# Patient Record
Sex: Male | Born: 1935 | Race: White | Hispanic: No | Smoking: Former smoker
Health system: Southern US, Community
[De-identification: ages and names within clinical notes are randomized; demographics above are authoritative.]

## PROBLEM LIST (undated history)

## (undated) DIAGNOSIS — I219 Acute myocardial infarction, unspecified: Secondary | ICD-10-CM

## (undated) DIAGNOSIS — E785 Hyperlipidemia, unspecified: Secondary | ICD-10-CM

## (undated) DIAGNOSIS — J449 Chronic obstructive pulmonary disease, unspecified: Secondary | ICD-10-CM

## (undated) DIAGNOSIS — C801 Malignant (primary) neoplasm, unspecified: Secondary | ICD-10-CM

## (undated) DIAGNOSIS — I10 Essential (primary) hypertension: Secondary | ICD-10-CM

## (undated) DIAGNOSIS — I701 Atherosclerosis of renal artery: Secondary | ICD-10-CM

## (undated) HISTORY — PX: CHOLECYSTECTOMY: SHX55

## (undated) HISTORY — DX: Essential (primary) hypertension: I10

## (undated) HISTORY — PX: CORONARY ANGIOPLASTY WITH STENT PLACEMENT: SHX49

## (undated) HISTORY — DX: Hyperlipidemia, unspecified: E78.5

## (undated) HISTORY — DX: Acute myocardial infarction, unspecified: I21.9

---

## 1993-10-24 DIAGNOSIS — I219 Acute myocardial infarction, unspecified: Secondary | ICD-10-CM

## 1993-10-24 HISTORY — DX: Acute myocardial infarction, unspecified: I21.9

## 2007-10-08 ENCOUNTER — Emergency Department: Payer: Self-pay | Admitting: Internal Medicine

## 2009-04-30 ENCOUNTER — Ambulatory Visit: Payer: Self-pay | Admitting: Unknown Physician Specialty

## 2011-05-29 ENCOUNTER — Emergency Department: Payer: Self-pay | Admitting: Emergency Medicine

## 2011-06-14 ENCOUNTER — Ambulatory Visit: Payer: Self-pay | Admitting: Surgery

## 2011-06-20 ENCOUNTER — Ambulatory Visit: Payer: Self-pay | Admitting: Surgery

## 2013-07-07 LAB — CBC
MCV: 95 fL (ref 80–100)
RBC: 4.65 10*6/uL (ref 4.40–5.90)
RDW: 13.9 % (ref 11.5–14.5)
WBC: 7.3 10*3/uL (ref 3.8–10.6)

## 2013-07-07 LAB — BASIC METABOLIC PANEL
Calcium, Total: 8.6 mg/dL (ref 8.5–10.1)
Chloride: 109 mmol/L — ABNORMAL HIGH (ref 98–107)
Co2: 27 mmol/L (ref 21–32)
Creatinine: 1.5 mg/dL — ABNORMAL HIGH (ref 0.60–1.30)
EGFR (Non-African Amer.): 42 — ABNORMAL LOW
Glucose: 98 mg/dL (ref 65–99)
Osmolality: 283 (ref 275–301)
Potassium: 4.3 mmol/L (ref 3.5–5.1)

## 2013-07-07 LAB — CK TOTAL AND CKMB (NOT AT ARMC): CK, Total: 97 U/L (ref 35–232)

## 2013-07-08 ENCOUNTER — Inpatient Hospital Stay: Payer: Self-pay | Admitting: Student

## 2013-07-08 LAB — CK TOTAL AND CKMB (NOT AT ARMC)
CK, Total: 79 U/L (ref 35–232)
CK, Total: 83 U/L (ref 35–232)
CK-MB: 2.4 ng/mL (ref 0.5–3.6)

## 2013-07-08 LAB — TROPONIN I: Troponin-I: <0.02 ng/mL

## 2013-07-08 LAB — BASIC METABOLIC PANEL
Anion Gap: 5 — ABNORMAL LOW (ref 7–16)
BUN: 20 mg/dL — ABNORMAL HIGH (ref 7–18)
Chloride: 108 mmol/L — ABNORMAL HIGH (ref 98–107)
Co2: 30 mmol/L (ref 21–32)
Creatinine: 1.46 mg/dL — ABNORMAL HIGH (ref 0.60–1.30)
EGFR (Non-African Amer.): 44 — ABNORMAL LOW
Glucose: 85 mg/dL (ref 65–99)
Potassium: 4.4 mmol/L (ref 3.5–5.1)

## 2013-07-09 LAB — BASIC METABOLIC PANEL
BUN: 19 mg/dL — ABNORMAL HIGH (ref 7–18)
Calcium, Total: 8.8 mg/dL (ref 8.5–10.1)
Creatinine: 1.44 mg/dL — ABNORMAL HIGH (ref 0.60–1.30)
EGFR (African American): 52 — ABNORMAL LOW
Glucose: 97 mg/dL (ref 65–99)
Osmolality: 280 (ref 275–301)
Potassium: 4 mmol/L (ref 3.5–5.1)
Sodium: 139 mmol/L (ref 136–145)

## 2013-07-30 ENCOUNTER — Institutional Professional Consult (permissible substitution): Payer: Self-pay | Admitting: Pulmonary Disease

## 2013-08-06 ENCOUNTER — Encounter: Payer: Self-pay | Admitting: Pulmonary Disease

## 2013-08-06 ENCOUNTER — Ambulatory Visit (INDEPENDENT_AMBULATORY_CARE_PROVIDER_SITE_OTHER): Payer: Medicare Other | Admitting: Pulmonary Disease

## 2013-08-06 VITALS — BP 104/60 | HR 62 | Temp 97.7°F | Ht 71.0 in | Wt 196.0 lb

## 2013-08-06 DIAGNOSIS — J9611 Chronic respiratory failure with hypoxia: Secondary | ICD-10-CM

## 2013-08-06 DIAGNOSIS — J9610 Chronic respiratory failure, unspecified whether with hypoxia or hypercapnia: Secondary | ICD-10-CM

## 2013-08-06 NOTE — Patient Instructions (Signed)
You don't need to use spiriva or oxygen during the day anymore We will set up a sleep oxygen test for you.  Don't wear the oxygen when you do this test. We will see you again if you have trouble breathing or cough

## 2013-08-06 NOTE — Progress Notes (Signed)
Subjective:    Patient ID: Louis Harrison, male    DOB: 05-11-1928, 79 y.o.   MRN: [id number]  HPI  This is a very pleasant 79 year old male who comes to our clinic today for evaluation of hypoxemic respiratory failure. He smoked one and a half packs of cigarettes daily for 25-30 years and quit in 1995. Since then he has never had respiratory symptoms of any kind. He stays active in his garden and in the yard and walks 1 mile a day for exercise purposes. He never had shortness of breath with any of these activities.  In September 2014 he developed the sudden onset of dizziness which lasted for 15 minutes. He was admitted to [facility] for evaluation for 3 days. During this time he had a CT scan which was normal. His symptoms never returned after the initial 15 minutes. He was found to have hypoxemic respiratory failure during this time so a VQ scan was performed which showed no evidence of a pulmonary embolism. He also had a left heart catheterization (by report I do not have evidence of this) which he says was normal. He was started on Spiriva empirically for COPD and 2 L of oxygen continuously. He has been using his oxygen regularly but does not feel it makes any difference in his breathing.  His wife states that he snores at night some but never stops breathing.  As noted above, he continues to stay active and has no symptoms of shortness of breath.  He has noted a cough in the last month which she attributes to a cold he caught. He does not have recurrent bronchitis symptoms which have been a problem over the last several months to years.  Past Medical History  Diagnosis Date  . Heart attack 1995  . Hypertension   . Hyperlipidemia      Family History  Problem Relation Age of Onset  . Family history unknown: Yes     History   Social History  . Marital Status: Married    Spouse Name: N/A    Number of Children: N/A  . Years of Education: N/A    Occupational History  . Not on file.   Social History Main Topics  . Smoking status: Former Smoker -- 1.50 packs/day for 30 years    Types: Cigarettes    Quit date: 10/24/1993  . Smokeless tobacco: Not on file  . Alcohol Use: No  . Drug Use: No  . Sexual Activity: Not on file   Other Topics Concern  . Not on file   Social History Narrative  . No narrative on file     No Known Allergies   No outpatient prescriptions prior to visit.   No facility-administered medications prior to visit.      Review of Systems  Constitutional: Negative for fever, chills, activity change, appetite change and unexpected weight change.  HENT: Positive for congestion. Negative for dental problem, postnasal drip, rhinorrhea, sneezing, sore throat, trouble swallowing and voice change.   Eyes: Negative for visual disturbance.  Respiratory: Negative for cough, choking and shortness of breath.   Cardiovascular: Negative for chest pain and leg swelling.  Gastrointestinal: Negative for nausea, vomiting and abdominal pain.  Genitourinary: Negative for difficulty urinating.  Musculoskeletal: Negative for arthralgias.  Skin: Negative for rash.  Psychiatric/Behavioral: Negative for behavioral problems and confusion.       Objective:   Physical Exam  Filed Vitals:   08/06/13 1026  BP: 104/60  Pulse: 62  Temp: 97.7 F (36.5 C)  TempSrc: Oral  Height: 5\' 11"  (1.803 m)  Weight: 196 lb (88.905 kg)  SpO2: 93%   Gen: well appearing, no acute distress HEENT: NCAT, PERRL, EOMi, OP clear, neck supple without masses PULM: CTA B CV: RRR, no mgr, no JVD AB: BS+, soft, nontender, no hsm Ext: warm, no edema, no clubbing, no cyanosis Derm: no rash or skin breakdown Neuro: A&Ox4, CN II-XII intact, strength 5/5 in all 4 extremities  08/06/2013 walked 500 feet in office on RA, never dropped below 90% 08/06/2013 simple spirometry completely normal 06/2013 CXR reviewed by me> normal lung fields, normal  cardiac sillhouette     Assessment & Plan:   Chronic hypoxemic respiratory failure Today in the office Louis Harrison did not have evidence of exertional hypoxemia. His oxygen level was also normal at rest. Further, he performed a simple spirometry test today which was completely normal. I reviewed his chest x-ray from [facility] from last month and it is normal. His physical exam today is normal.  What I don't know is if he has hypoxemia at night and so we need to order an overnight oximetry test.  So in summary, I cannot find any objective abnormality from a respiratory standpoint today. It is unclear to me why he had hypoxemia during his recent hospitalization. It does not appear that he had evidence of a pulmonary embolism based on the normal VQ scan.  Plan: -Stop Spiriva -Stop daytime oxygen -Continue nighttime oxygen until we have a room air overnight oximetry test -Followup with me if symptoms develop   Updated Medication List Outpatient Encounter Prescriptions as of 08/06/2013  Medication Sig Dispense Refill  . amLODipine (NORVASC) 2.5 MG tablet Take 2.5 mg by mouth daily.      Marland Kitchen aspirin 325 MG tablet Take 325 mg by mouth daily.      Marland Kitchen atenolol (TENORMIN) 50 MG tablet Take 25 mg by mouth daily.      . isosorbide mononitrate (IMDUR) 30 MG 24 hr tablet Take 30 mg by mouth daily.      Marland Kitchen lisinopril (PRINIVIL,ZESTRIL) 5 MG tablet Take 5 mg by mouth daily.      Marland Kitchen omeprazole (PRILOSEC) 20 MG capsule Take 20 mg by mouth daily.      . simvastatin (ZOCOR) 20 MG tablet Take 20 mg by mouth every evening.      . tamsulosin (FLOMAX) 0.4 MG CAPS capsule Take 0.4 mg by mouth daily.      Marland Kitchen tiotropium (SPIRIVA) 18 MCG inhalation capsule Place 18 mcg into inhaler and inhale daily.       No facility-administered encounter medications on file as of 08/06/2013.

## 2013-08-06 NOTE — Assessment & Plan Note (Signed)
Today in the office Louis Harrison did not have evidence of exertional hypoxemia. His oxygen level was also normal at rest. Further, he performed a simple spirometry test today which was completely normal. I reviewed his chest x-ray from [facility] from last month and it is normal. His physical exam today is normal.  What I don't know is if he has hypoxemia at night and so we need to order an overnight oximetry test.  So in summary, I cannot find any objective abnormality from a respiratory standpoint today. It is unclear to me why he had hypoxemia during his recent hospitalization. It does not appear that he had evidence of a pulmonary embolism based on the normal VQ scan.  Plan: -Stop Spiriva -Stop daytime oxygen -Continue nighttime oxygen until we have a room air overnight oximetry test -Followup with me if symptoms develop

## 2013-08-21 ENCOUNTER — Telehealth: Payer: Self-pay | Admitting: Pulmonary Disease

## 2013-08-21 DIAGNOSIS — J9610 Chronic respiratory failure, unspecified whether with hypoxia or hypercapnia: Secondary | ICD-10-CM

## 2013-08-21 NOTE — Telephone Encounter (Signed)
I spoke with pt spouse. Pt had ONO done last week. Pt is not using the O2 at all and would like order to d/c this. Dr. Lake Bells please advise if you have results? thanks

## 2013-08-21 NOTE — Telephone Encounter (Signed)
Have we seen the ONO results yet? If they are normal then we can d/c the O2

## 2013-08-22 NOTE — Telephone Encounter (Addendum)
FYI BQ, these have not been received yet. We will get them to you once received and will inform you. ONO requested from [facility]--to be faxed to our office (triage fax)  Will forward to [facility] to keep an eye out for these to come across her desk.

## 2013-08-22 NOTE — Telephone Encounter (Signed)
ONO received and placed in BQ's lookat  Will forward to him as reminder to review when in clinic next time Thanks

## 2013-08-22 NOTE — Telephone Encounter (Signed)
Pt calling in ref to previous msg can be reached at [phone].Louis Harrison

## 2013-08-22 NOTE — Telephone Encounter (Signed)
Pt wife aware that once Dr. Lake Bells reviews the Joselyn Arrow results we will send order as needed. [place] Bing, CMA

## 2013-08-26 ENCOUNTER — Telehealth: Payer: Self-pay | Admitting: *Deleted

## 2013-08-26 ENCOUNTER — Encounter: Payer: Self-pay | Admitting: Pulmonary Disease

## 2013-08-26 NOTE — Telephone Encounter (Signed)
Pt's wife is aware of ONO results. Order will be placed for O2 to be dc'd.

## 2013-08-26 NOTE — Telephone Encounter (Signed)
Pt's wife is aware of ONO results.

## 2013-08-26 NOTE — Telephone Encounter (Signed)
Message copied by Horatio Pel on Mon Aug 26, 2013 10:14 AM ------      Message from: Simonne Maffucci B      Created: Mon Aug 26, 2013  8:59 AM       L,            Please let him know that the ONO showed mild desaturation for about an hour at night.  He really does not need oxygen for this.            Thanks,      B ------

## 2013-08-26 NOTE — Telephone Encounter (Signed)
lmtcb x1 

## 2014-03-24 DIAGNOSIS — J449 Chronic obstructive pulmonary disease, unspecified: Secondary | ICD-10-CM | POA: Insufficient documentation

## 2014-03-24 DIAGNOSIS — Z9889 Other specified postprocedural states: Secondary | ICD-10-CM | POA: Insufficient documentation

## 2014-03-24 DIAGNOSIS — I219 Acute myocardial infarction, unspecified: Secondary | ICD-10-CM | POA: Insufficient documentation

## 2014-03-24 DIAGNOSIS — E785 Hyperlipidemia, unspecified: Secondary | ICD-10-CM | POA: Insufficient documentation

## 2014-03-24 DIAGNOSIS — I701 Atherosclerosis of renal artery: Secondary | ICD-10-CM | POA: Insufficient documentation

## 2014-07-14 ENCOUNTER — Ambulatory Visit: Payer: Self-pay | Admitting: Otolaryngology

## 2014-07-14 LAB — CBC WITH DIFFERENTIAL/PLATELET
BASOS ABS: 0.1 10*3/uL (ref 0.0–0.1)
Basophil %: 0.8 %
EOS PCT: 2.9 %
Eosinophil #: 0.3 10*3/uL (ref 0.0–0.7)
HCT: 45.5 % (ref 40.0–52.0)
HGB: 14.8 g/dL (ref 13.0–18.0)
LYMPHS ABS: 1.1 10*3/uL (ref 1.0–3.6)
LYMPHS PCT: 13.2 %
MCH: 32.1 pg (ref 26.0–34.0)
MCHC: 32.5 g/dL (ref 32.0–36.0)
MCV: 99 fL (ref 80–100)
MONO ABS: 0.9 x10 3/mm (ref 0.2–1.0)
MONOS PCT: 10.3 %
NEUTROS ABS: 6.4 10*3/uL (ref 1.4–6.5)
Neutrophil %: 72.8 %
PLATELETS: 128 10*3/uL — AB (ref 150–440)
RBC: 4.62 10*6/uL (ref 4.40–5.90)
RDW: 14.3 % (ref 11.5–14.5)
WBC: 8.7 10*3/uL (ref 3.8–10.6)

## 2014-07-23 ENCOUNTER — Ambulatory Visit: Payer: Self-pay | Admitting: Otolaryngology

## 2014-07-25 LAB — PATHOLOGY REPORT

## 2014-07-30 ENCOUNTER — Ambulatory Visit: Payer: Self-pay | Admitting: Radiation Oncology

## 2014-08-24 ENCOUNTER — Ambulatory Visit: Payer: Self-pay | Admitting: Radiation Oncology

## 2014-08-27 LAB — CBC CANCER CENTER
Basophil #: 0.1 x10 3/mm (ref 0.0–0.1)
Basophil %: 1.2 %
EOS ABS: 0.4 x10 3/mm (ref 0.0–0.7)
EOS PCT: 5.2 %
HCT: 46.9 % (ref 40.0–52.0)
HGB: 15.6 g/dL (ref 13.0–18.0)
Lymphocyte #: 1.1 x10 3/mm (ref 1.0–3.6)
Lymphocyte %: 14.2 %
MCH: 33 pg (ref 26.0–34.0)
MCHC: 33.3 g/dL (ref 32.0–36.0)
MCV: 99 fL (ref 80–100)
MONO ABS: 0.8 x10 3/mm (ref 0.2–1.0)
MONOS PCT: 9.4 %
NEUTROS PCT: 70 %
Neutrophil #: 5.6 x10 3/mm (ref 1.4–6.5)
Platelet: 150 x10 3/mm (ref 150–440)
RBC: 4.73 10*6/uL (ref 4.40–5.90)
RDW: 14.2 % (ref 11.5–14.5)
WBC: 8 x10 3/mm (ref 3.8–10.6)

## 2014-09-03 LAB — CBC CANCER CENTER
BASOS ABS: 0.1 x10 3/mm (ref 0.0–0.1)
Basophil %: 1.3 %
EOS ABS: 0.3 x10 3/mm (ref 0.0–0.7)
Eosinophil %: 3.9 %
HCT: 45.2 % (ref 40.0–52.0)
HGB: 15 g/dL (ref 13.0–18.0)
Lymphocyte #: 1.3 x10 3/mm (ref 1.0–3.6)
Lymphocyte %: 16.2 %
MCH: 33 pg (ref 26.0–34.0)
MCHC: 33.2 g/dL (ref 32.0–36.0)
MCV: 99 fL (ref 80–100)
MONO ABS: 0.8 x10 3/mm (ref 0.2–1.0)
MONOS PCT: 9.6 %
NEUTROS PCT: 69 %
Neutrophil #: 5.4 x10 3/mm (ref 1.4–6.5)
Platelet: 134 x10 3/mm — ABNORMAL LOW (ref 150–440)
RBC: 4.54 10*6/uL (ref 4.40–5.90)
RDW: 14 % (ref 11.5–14.5)
WBC: 7.8 x10 3/mm (ref 3.8–10.6)

## 2014-09-10 LAB — CBC CANCER CENTER
BASOS ABS: 0.1 x10 3/mm (ref 0.0–0.1)
Basophil %: 1 %
Eosinophil #: 0.2 x10 3/mm (ref 0.0–0.7)
Eosinophil %: 2.8 %
HCT: 45.8 % (ref 40.0–52.0)
HGB: 15.2 g/dL (ref 13.0–18.0)
Lymphocyte #: 1 x10 3/mm (ref 1.0–3.6)
Lymphocyte %: 11.5 %
MCH: 32.7 pg (ref 26.0–34.0)
MCHC: 33.1 g/dL (ref 32.0–36.0)
MCV: 99 fL (ref 80–100)
MONO ABS: 0.8 x10 3/mm (ref 0.2–1.0)
Monocyte %: 8.9 %
Neutrophil #: 6.4 x10 3/mm (ref 1.4–6.5)
Neutrophil %: 75.8 %
PLATELETS: 142 x10 3/mm — AB (ref 150–440)
RBC: 4.63 10*6/uL (ref 4.40–5.90)
RDW: 13.9 % (ref 11.5–14.5)
WBC: 8.5 x10 3/mm (ref 3.8–10.6)

## 2014-09-16 LAB — CBC CANCER CENTER
BASOS ABS: 0.1 x10 3/mm (ref 0.0–0.1)
BASOS PCT: 0.8 %
EOS ABS: 0.3 x10 3/mm (ref 0.0–0.7)
Eosinophil %: 3.7 %
HCT: 46.4 % (ref 40.0–52.0)
HGB: 15.4 g/dL (ref 13.0–18.0)
LYMPHS PCT: 10.8 %
Lymphocyte #: 1 x10 3/mm (ref 1.0–3.6)
MCH: 33 pg (ref 26.0–34.0)
MCHC: 33.2 g/dL (ref 32.0–36.0)
MCV: 100 fL (ref 80–100)
MONOS PCT: 8.7 %
Monocyte #: 0.8 x10 3/mm (ref 0.2–1.0)
NEUTROS ABS: 7.1 x10 3/mm — AB (ref 1.4–6.5)
Neutrophil %: 76 %
Platelet: 164 x10 3/mm (ref 150–440)
RBC: 4.67 10*6/uL (ref 4.40–5.90)
RDW: 13.6 % (ref 11.5–14.5)
WBC: 9.4 x10 3/mm (ref 3.8–10.6)

## 2014-09-23 ENCOUNTER — Ambulatory Visit: Payer: Self-pay | Admitting: Radiation Oncology

## 2014-09-24 LAB — CBC CANCER CENTER
Basophil #: 0.1 x10 3/mm (ref 0.0–0.1)
Basophil %: 0.7 %
EOS ABS: 0.1 x10 3/mm (ref 0.0–0.7)
EOS PCT: 1.7 %
HCT: 43.6 % (ref 40.0–52.0)
HGB: 14.6 g/dL (ref 13.0–18.0)
Lymphocyte #: 0.7 x10 3/mm — ABNORMAL LOW (ref 1.0–3.6)
Lymphocyte %: 8.7 %
MCH: 33 pg (ref 26.0–34.0)
MCHC: 33.5 g/dL (ref 32.0–36.0)
MCV: 98 fL (ref 80–100)
Monocyte #: 0.8 x10 3/mm (ref 0.2–1.0)
Monocyte %: 9.9 %
Neutrophil #: 6.6 x10 3/mm — ABNORMAL HIGH (ref 1.4–6.5)
Neutrophil %: 79 %
Platelet: 156 x10 3/mm (ref 150–440)
RBC: 4.43 10*6/uL (ref 4.40–5.90)
RDW: 13.1 % (ref 11.5–14.5)
WBC: 8.3 x10 3/mm (ref 3.8–10.6)

## 2014-10-01 LAB — CBC CANCER CENTER
Basophil #: 0.1 x10 3/mm (ref 0.0–0.1)
Basophil %: 1 %
EOS PCT: 3.8 %
Eosinophil #: 0.3 x10 3/mm (ref 0.0–0.7)
HCT: 42.2 % (ref 40.0–52.0)
HGB: 14.1 g/dL (ref 13.0–18.0)
Lymphocyte #: 1 x10 3/mm (ref 1.0–3.6)
Lymphocyte %: 13.9 %
MCH: 33.1 pg (ref 26.0–34.0)
MCHC: 33.4 g/dL (ref 32.0–36.0)
MCV: 99 fL (ref 80–100)
MONO ABS: 0.7 x10 3/mm (ref 0.2–1.0)
MONOS PCT: 9.5 %
Neutrophil #: 5.1 x10 3/mm (ref 1.4–6.5)
Neutrophil %: 71.8 %
Platelet: 155 x10 3/mm (ref 150–440)
RBC: 4.27 10*6/uL — ABNORMAL LOW (ref 4.40–5.90)
RDW: 13.2 % (ref 11.5–14.5)
WBC: 7.1 x10 3/mm (ref 3.8–10.6)

## 2014-10-08 LAB — CBC CANCER CENTER
Basophil #: 0.1 x10 3/mm (ref 0.0–0.1)
Basophil %: 1.3 %
Eosinophil #: 0.4 x10 3/mm (ref 0.0–0.7)
Eosinophil %: 4.8 %
HCT: 47.3 % (ref 40.0–52.0)
HGB: 15.7 g/dL (ref 13.0–18.0)
Lymphocyte #: 0.9 x10 3/mm — ABNORMAL LOW (ref 1.0–3.6)
Lymphocyte %: 11.5 %
MCH: 32.3 pg (ref 26.0–34.0)
MCHC: 33.2 g/dL (ref 32.0–36.0)
MCV: 97 fL (ref 80–100)
Monocyte #: 0.7 x10 3/mm (ref 0.2–1.0)
Monocyte %: 8.7 %
NEUTROS ABS: 5.9 x10 3/mm (ref 1.4–6.5)
NEUTROS PCT: 73.7 %
Platelet: 157 x10 3/mm (ref 150–440)
RBC: 4.87 10*6/uL (ref 4.40–5.90)
RDW: 13.2 % (ref 11.5–14.5)
WBC: 8 x10 3/mm (ref 3.8–10.6)

## 2014-10-24 ENCOUNTER — Ambulatory Visit: Payer: Self-pay | Admitting: Radiation Oncology

## 2015-02-13 NOTE — Discharge Summary (Signed)
PATIENT NAME:  Louis Harrison, Louis Harrison MR#:  [id number] DATE OF BIRTH:  10/25/1927  DATE OF ADMISSION:  07/08/2013 DATE OF DISCHARGE:  07/10/2013  CONSULTANTS:  Dr. Nehemiah Massed from cardiology.   PRIMARY CARE PHYSICIAN:  At [facility].   PRIMARY CARDIOLOGIST:  Dr. Saralyn Pilar  CHIEF COMPLAINT:  Shortness of breath and confusion.   DISCHARGE DIAGNOSES: 1. Hypoxic respiratory failure likely chronic and secondary to underlying suspected chronic obstructive pulmonary disease.  2.  Renal failure of likely chronic stage III.  3.  Coronary artery disease status post cardiac catheter showing 3-vessel coronary artery disease and medical management was recommended.  4.  Hypertension.  5.  Episode of confusion last week resolved, unclear etiology.  6.  History of sinus bradycardia.  7.  History of tobacco abuse, quit in 1995.  8.  Benign prostatic hypertrophy.  9.  Hyperlipidemia.   DISCHARGE MEDICATIONS:  Atenolol 50 mg once a day, Spiriva 18 mcg inhaled 1 cap daily, lisinopril 5 mg daily, aspirin 325 mg 1 tab once a day, omeprazole 20 mg daily, tamsulosin 0.4 mg once a day, simvastatin 20 mg daily, isosorbide mononitrate 30 mg once a day, amlodipine 2.5 mg once a day.   DISPOSITION:  He will be getting discharged to home with 2 liters continuous oxygen via nasal cannula.   DIET:  Low sodium.   ACTIVITY:  As tolerated.   FOLLOWUP:  Please follow with PCP and your cardiologist within 1 to 2 weeks.  Please follow with pulmonary for PFT testing within 1 to 2 weeks.   DISPOSITION:  Home.  CODE STATUS:  FULL CODE.  SIGNIFICANT LABORATORIES AND IMAGING: Initial BNP was 1006, BUN 22. Creatinine was 1.5, last creatinine of 1.44. BUN of 19 yesterday. Troponins were negative. CK-MB are negative x 3. WBC and hemoglobin within normal limits. Platelets are 130. D-dimer was 0.95. ABG showed pH of 7.4, pCO2 of 40, pO2 of 55.  Echocardiogram showed EF of 50% to 55%, mildly dilated right atrium and left atrium,  mild to moderate tricuspid regurgitation and mitral valve regurgitation.  CT of the head without contrast:  Chronic microvascular ischemic disease, atrophy appropriate for the patient's age. No acute intracranial abnormalities evident.  X-rays of the chest done September 15, showing possible low-grade CHF, perihilar subsegmental atelectasis.  X-ray of the chest, PA and lateral, September 14, showed no acute cardiopulmonary disease.  Lung V/Q scan done on September 16, showed no focal perfusion defect. Overall, probability of pulmonary embolism is low.  HISTORY OF PRESENT ILLNESS AND HOSPITAL COURSE:  For full details of H and P, please see the dictation by Dr. Darvin Neighbours on September 14; but briefly, this is a pleasant 81 year old ex-smoker who smoked for many years but quit in 1995 after a heart attack who came in for the above chief complaint. Has had 2 episodes of shortness of breath and came into the hospital. The patient also did complain of having some confusion a couple weeks ago. The patient had a CAT scan of the head that did not show any acute stroke, was admitted to the hospitalist service. He was admitted to telemetry and thought that the patient likely had underlying angina equivalent. BNP was slightly elevated, but he did not appear to be in congestive heart failure. There was no edema or JVD and no crackles on exam. He did have mild renal failure possibly chronic. He was admitted to the hospitalist service with cardiology consult. He underwent cardiac cath showing 3-vessel chronic disease but medical management was  recommended. He was started on Imdur.  He has had no shortness of breath here; however, was having continuous hypoxemia. He was sent for a V/Q scan, as he could not have a CT scan PE protocol given his renal failure; however, that was a low probability exam. D-dimer was mildly elevated but I suspect the patient likely has underlying COPD. He is not wheezy nor does he have crackles.   He does not appear to be in congestive heart failure and his oxygen levels do drop with ambulation. He was started on Spiriva and he will be discharged on oxygen with instructions to follow with PCP and get referred to pulmonologist for pulmonary function tests.  On the day of discharge, his temperature is 97.7, pulse 61, respiratory 20, blood pressure 103/58, O2 sat 94% on 2 liters. He is a well-developed Caucasian male lying in bed, no obvious distress.   HEENT:  Normocephalic, atraumatic. Pupils are equal.  LUNGS:  Clear to auscultation without wheezing, rhonchi or rales.  ABDOMEN:  Soft, nontender, nondistended.  EXTREMITIES: Showed no lower extremity edema. He is ambulating in the room.  He will be discharged with outpatient followup.  Of note:  His blood pressure was somewhat suboptimal, and he was started on amlodipine low-dose and I discussed with wife that if the pressures fluctuate as an outpatient, this could be held.  TOTAL TIME SPENT:  35 minutes   ____________________________ Vivien Presto, MD sa:ce D: 07/10/2013 12:03:20 ET T: 07/10/2013 12:29:42 ET JOB#: [id number]  cc: Vivien Presto, MD, <Dictator> Vivien Presto MD ELECTRONICALLY SIGNED 07/16/2013 14:08

## 2015-02-13 NOTE — Consult Note (Signed)
PATIENT NAME:  Louis Harrison, Louis Harrison MR#:  [id number] DATE OF BIRTH:  1927-12-10  DATE OF CONSULTATION:  07/08/2013  REFERRING PHYSICIAN: Dr. Darvin Neighbours  CONSULTING PHYSICIAN:  Corey Skains, MD  REASON FOR CONSULTATION: Unstable angina with coronary artery disease.  HISTORY OF PRESENT ILLNESS: This is an 81 year old male with known coronary disease, status post stent placement and myocardial infarction in 1995. The patient also has hypertension and hyperlipidemia on appropriate medication management. He has had worsening episodes of chest pain and pressure radiating into his back with  weakness, fatigue, dizziness and shortness of breath. This lasts for about 10 minutes and then is relieved. The patient has progressive episodes of these waxing and waning while he was seen in the Emergency Room with no current evidence of congestive heart failure or new EKG changes, although EKG shows normal sinus rhythm, left atrial enlargement with septal myocardial infarction. The patient has had no current evidence of myocardial infarction with normal troponin. Currently, he feels well on oxygen.   REVIEW OF SYSTEMS: The remainder review of systems negative for vision change, ringing in the ears, hearing loss, cough, congestion, heartburn, nausea, vomiting, diarrhea, bloody stools, stomach pain, extremity pain, leg weakness, cramping of the buttocks, known blood clots, headaches, blackouts, nosebleeds, congestion, trouble swallowing, frequent urination, urination at night, muscle weakness, numbness, anxiety, depression, skin lesions or skin rashes.   PAST MEDICAL HISTORY:  1.  Myocardial infarction. 2.  Coronary artery disease.  3.  Hypertension.  4.  Hyperlipidemia.   FAMILY HISTORY: No family members with early onset of cardiovascular disease or hypertension.   SOCIAL HISTORY: The patient has remote tobacco use. Currently denies alcohol or tobacco use.   ALLERGIES: As listed.   MEDICATIONS: As listed.    PHYSICAL EXAMINATION:  VITAL SIGNS: Blood pressure 126/68 bilaterally, heart rate 72 upright, reclining, and irregular.  GENERAL: He is a well appearing male in no acute distress.  HEENT: No icterus, thyromegaly, ulcers, hemorrhage, or xanthelasma.  CARDIOVASCULAR: Regular rate and rhythm with normal S1 and S2 with no apparent murmur, gallop, or rub. PMI is diffuse. Carotid upstroke normal without bruit. Jugular venous pressure is normal.  LUNGS: Have few basilar crackles with normal respirations.  ABDOMEN: Soft, nontender, without hepatosplenomegaly or masses. Abdominal aorta is normal size without bruit.  EXTREMITIES: 2+ radial, femoral, dorsal pedal pulses with no lower extremity edema, cyanosis, clubbing or ulcers.  NEUROLOGIC: Oriented to time, place, and person, with normal mood and affect.   ASSESSMENT: An 81 year old male with hypertension, hyperlipidemia, coronary artery disease, old myocardial infarction, abnormal EKG with progressive symptoms suggestive of unstable angina, but no current evidence of acute congestive heart failure.   RECOMMENDATIONS:  1.  Echocardiogram for LV systolic dysfunction, valvular heart disease causing shortness of breath.  2.  Proceed to cardiac catheterization to assess coronary anatomy and further treatment thereof as necessary due to acute unstable angina and [place] class III to IV anginal symptoms without evidence of myocardial infarction.  3.  Continue current medical regimen for hypertension control, stable at this time.  4.  Followup closely chest x-ray for further concerns of congestive heart failure with ambulation.  5.  Further diagnostic testing and treatment as necessary. The patient understands the risks and benefits of cardiac catheterization and these include the possibly of death, stroke, heart attack, infection, bleeding or blood clot. He is at low risk for conscious sedation.   ____________________________ Corey Skains,  MD [id number] D: 07/08/2013 08:45:14 ET T: 07/08/2013 09:03:32 ET JOB#: [id number]  cc: Corey Skains, MD, <Dictator> Corey Skains MD ELECTRONICALLY SIGNED 07/09/2013 7:35

## 2015-02-13 NOTE — H&P (Signed)
PATIENT NAME:  Louis Harrison, Louis Harrison MR#:  [id number] DATE OF BIRTH:  March 30, 1928  DATE OF ADMISSION:  07/07/2013  PRIMARY CARE PHYSICIAN:  [facility]  PRIMARY CARDIOLOGIST:  Dr. Saralyn Pilar   CHIEF COMPLAINT:  Shortness of breath and confusion.   HISTORY OF PRESENT ILLNESS: An 81 year old male patient with history of coronary artery disease, hypertension, presents to the Emergency Room, brought in by the family after he had 2 episodes of shortness of breath. His initial episode was 2 days back, which lasted about half an hour, occurred after he walked for about 1/2 mile. This is unusual, as patient normally  tends to walk up to 1 to 1-1/2 miles and does not get short of breath. Today patient again had a similar episode, along with some lightheadedness. Did not have any cough, chest pain, palpitations or syncope. The patient had 2 episodes of confusion lasting about 30 minutes, as per the wife, in the past 2 weeks. Then he regained his baseline mental status. Did not have any focal weakness, fall or dizziness. Does not have any dementia. No history of CVA.   In the Emergency Room, patient has been found to have elevated D-dimer, but creatinine is high at 1.5, and a CT for PE could not be done. The patient's cardiac enzymes are normal. EKG shows a prior anterior infarct. No acute ST-T wave changes. Chest x-ray is normal.   The patient is being admitted under observation for angina equivalent workup.   PAST MEDICAL HISTORY: 1.  Coronary artery disease, with PCI and stent placed in 1995.  2.  Hypertension.  3.  Past tobacco abuse, quit in 1995.  4.  Sinus bradycardia secondary to atenolol, dose reduced 3 months back.  5.  Hyperlipidemia.  6.  Benign prostatic hypertrophy.   PAST SURGICAL HISTORY:  Cardiac cath.   SOCIAL HISTORY: The patient used to smoke in the past, quit in 1995. No alcohol. No illicit drugs. Ambulates on his own. Lives at home with his wife.   CODE STATUS:  FULL  CODE.  ALLERGIES:  No known drug allergies.   FAMILY HISTORY:  Coronary artery disease.   HOME MEDICATIONS INCLUDE:   1.  Atenolol 50 mg oral once a day.  2.  Lisinopril 5 mg oral daily.  3.  Aspirin 325 mg oral daily.  4.  Omeprazole 20 mg oral daily.  5.  Simvastatin 20 mg oral daily.  6.  Tamsulosin 0.4 mg oral daily.   REVIEW OF SYSTEMS:   CONSTITUTIONAL: Complains of some fatigue. No weight loss, weight gain.  EYES: No blurred vision, pain or redness.  ENT: No tinnitus, ear pain, hearing loss.  RESPIRATORY: No cough, wheeze,  and episodic shortness of breath.  CARDIOVASCULAR:  No chest pain, orthopnea, edema.  GASTROINTESTINAL: No nausea, vomiting, diarrhea, abdominal pain.  GENITOURINARY:  No dysuria, hematuria, frequency.  ENDOCRINE:  No polyuria, nocturia, thyroid problems.  HEMATOLOGIC AND LYMPHATIC:  No anemia, easy bruising, bleeding.  INTEGUMENTARY:  No acne, rash, lesions.  MUSCULOSKELETAL: Has arthritis.  NEUROLOGIC:  Had episodic confusion. No focal numbness, weakness or seizures.  PSYCHIATRIC: No anxiety or depression.   PHYSICAL EXAMINATION: VITAL SIGNS: Temperature 98.2, pulse of 67, respirations 18, blood pressure 155/72, and saturating 85% on room air and 95% on oxygen, although saturations tend to fluctuate all the way from 70 to 95.  GENERAL:  An elderly, obese, Caucasian male patient lying in bed, comfortable, in no acute distress.  PSYCHIATRIC:  Alert and oriented x 3. Mood and  affect appropriate. Judgment intact.  HEENT: Atraumatic, normocephalic. Oral mucosa moist and pink. External ears and nose normal. No pallor. No icterus.  NECK:  Supple. No thyromegaly. No palpable lymph nodes. Trachea midline. No carotid bruits or JVD.  CARDIOVASCULAR:  S1, S2, without any murmurs. Peripheral pulses 2+, no edema.  RESPIRATORY:  Normal work of breathing. Clear to auscultation on both sides.  GASTROINTESTINAL: Soft abdomen, nontender. Bowel sounds present. No  hepatosplenomegaly palpable.  SKIN:  Warm and dry. No petechiae, rash, ulcers.  MUSCULOSKELETAL:  No joint swelling, redness, effusion of the large joints. Normal muscle tone.  NEUROLOGICAL:  Motor strength 5/5 in upper and lower extremities. Cranial nerves II through XII are intact.  LYMPHATIC:  No cervical lymphadenopathy.  GENITOURINARY: No CVA tenderness or bladder distention.   LAB STUDIES:  Show glucose 98, BNP of 1000, BUN  22, creatinine 1.5, sodium 140, potassium 4.3, GFR 42. Troponin less than 0.02. WBC 7.3, hemoglobin 15.3, platelet count 130. D-dimer 0.95.   EKG shows normal sinus rhythm with Q waves in the anterior leads. No acute ST elevation.  Chest x-ray shows no pulmonary edema or infiltrates. Has some chronic changes.   ASSESSMENT AND PLAN: 1.  Episodic shortness of breath in a patient with history of coronary artery disease, could be angina equivalent. The patient did not have any chest pain with these episodes. With BNP being elevated, other possibility could be new onset congestive heart failure. He does not have any crackles on exam. No edema or JVD. Will get an echocardiogram. Will consult Dr. Saralyn Pilar of Cardiology, who he sees as outpatient. Get 2 more sets of cardiac enzymes. The patient does have elevated D-dimer. Will check a V/Q scan. I do not think he needs a heparin drip or Lovenox at this time until a PE is confirmed.   2.  Episodes of confusion. It is unclear if this was a TIA or what the exact etiology is. The patient did have episodes of sinus bradycardia recently, for which his atenolol was decreased. HR in the 60s now. Will check a CT scan of the head first to rule out any acute CVA in this patient. Although episodes of sinus bradycardia or hypotension could have caused these symptoms,  patient will be on telemetry floor and monitored. Get an echocardiogram. The patient did not have any seizures or syncopal episodes. He is not on any medications which could have  caused these symptoms.   3.  Chronic kidney disease stage III versus acute renal failure. I do not have a baseline creatinine. It is mildly elevated at 1.5. With his age and also history of hypertension, this could be his baseline. Will repeat a BMP in the morning.   4.  Hypertension. Continue home medications. Will reduce the atenolol dose to 25, considering his recent history of bradycardia.   5.  DVT prophylaxis with heparin.   6.  CODE STATUS:  FULL CODE.   Time spent today on this case was 60 minutes.    ____________________________ Leia Alf Quinita Kostelecky, MD srs:mr D: 07/07/2013 18:33:00 ET T: 07/07/2013 19:09:45 ET JOB#: [id number]  cc: Alveta Heimlich R. Darvin Neighbours, MD, <Dictator> [facility] Isaias Cowman, MD   Neita Carp MD ELECTRONICALLY SIGNED 07/07/2013 21:05

## 2015-02-14 NOTE — Op Note (Signed)
PATIENT NAME:  Louis Harrison, Louis Harrison MR#:  [id number] DATE OF BIRTH:  [birth year]  DATE OF PROCEDURE:  07/23/2014  PREOPERATIVE DIAGNOSIS: Hoarseness with vocal cord polyps.   POSTOPERATIVE DIAGNOSIS:  Hoarseness with vocal cord polyps.   PROCEDURE:  Microlaryngoscopy with excision of 2 separate right vocal cord polyps.    SURGEON: Sammuel Hines. Richardson Landry, MD.    ANESTHESIA: General endotracheal.   INDICATIONS: An 81 year old male with hoarseness with a polypoid lesion located along the right vocal cord mid to anterior cord region, as well as the far anterior cord right vocal cord near the anterior commissure.   FINDINGS: Two separate polypoid lesions were excised. The first one involved the anterior to mid cord region and was about 4 mm in diameter and pedunculated from the edge of the cord at the junction of the anterior and middle third of the cord. The second lesion involved the anterior commissure and appeared to be emanating from the anterior most portion of the right vocal cord. Both had a polypoid appearance to them. No other abnormalities were seen.   COMPLICATIONS: None.   DESCRIPTION OF PROCEDURE: After obtaining informed consent the patient was taken to the operating room and placed in the supine position. After induction of general endotracheal anesthesia the patient was turned 90 degrees. The head was draped with the eyes protected. A tooth guard was used to protect the upper teeth. A Dedo laryngoscope was then introduced into the airway and the hypopharynx, larynx, and tongue base inspected. The scope was placed into the laryngeal inlet and the patient placed into suspension. Photodocumentation of the right cord lesion was obtained using the 0 degree telescope. The microscope was then moved into position. The lesion on the middle to anterior section of the right vocal cord was grasped with cup forceps and excised using microsurgical dissection with microlaryngeal scissors. Bleeding was minimal  and easily controlled with Afrin pledgets. The scope was repositioned to reinspect the anterior commissure and there was a separate polypoid lesion involving the anterior commissure. The suction was used to palpate the lesion and it appeared to be attached to the anterior most portion of the right true vocal cord, but clearly separate from the original lesion. This was grasped with cup forceps and similarly excised. Minor bleeding was controlled with Afrin-moistened pledgets. Photodocumentation of postoperative appearance of the larynx was obtained. The laryngoscope was then removed and the patient returned to the anesthesiologist for awakening. He was awakened and taken to the recovery room in good condition postoperatively. Blood loss was minimal.    ____________________________ Sammuel Hines. Richardson Landry, MD psb:bu D: 07/23/2014 11:24:03 ET T: 07/23/2014 14:16:55 ET JOB#: [id number]  cc: Sammuel Hines. Richardson Landry, MD, <Dictator> Riley Nearing MD ELECTRONICALLY SIGNED 07/30/2014 12:12

## 2015-02-14 NOTE — Consult Note (Signed)
Reason for Visit: This 81 year old Male patient presents to the clinic for initial evaluation of  laryngeal cancer .   Referred by Dr. Richardson Landry.  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 He was initially tried on antibiotic therapy. He has greater than a 50-pack-year smoking history. Recently he was taken to the operating room where laryngoscopy was performed showing a right true CORD nodularity. 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 Patient is having no headache pain and no dysphagia. He is now referred to radiation oncology for consideration of treatment.  Past Hx:    CAD: cardiac stent 1995  Past, Family and Social History:  Past Medical History positive   Cardiovascular coronary artery disease; coronary artery stents; hyperlipidemia; hypertension; myocardial infarction   Genitourinary BPH   Past Surgical History cholecystectomy   Past Medical History Comments gallops   Family History noncontributory   Social History positive   Social History Comments greater than 50-pack-year smoking history has discontinued smoking. No EtOH use history   Additional Past Medical and Surgical History accompanied by his wife today   Allergies:   No Known Allergies:   No Known Allergies:   Home Meds:  Home Medications: Medication Instructions Status  isosorbide mononitrate 30 mg oral tablet, extended release 1 tab(s) orally once a day (in the morning) Active   amLODIPine 2.5 mg oral tablet 1 tab(s) orally once a day (in the morning) Active  lisinopril 5 mg oral tablet 1 tab(s) orally once a day (in the morning) Active  omeprazole 20 mg oral delayed release capsule 1 cap(s) orally 2 times a day Active  tamsulosin 0.4 mg oral capsule 1 cap(s) orally 2 times a week, As Needed Active  hydrocodone elixer 10 milliliter(s) orally every 4-6 hours as needed for pain Active  aspirin 325 mg oral tablet 1 tab(s) orally once a day (in the morning) Active  atenolol 50 mg oral tablet 0.5 tab(s) orally once a day (in the morning) Active  simvastatin 20 mg oral tablet 1 tab(s) orally once a day (at bedtime) Active   Review of Systems:  General negative   Performance Status (ECOG) 0   Skin negative   Breast negative   Ophthalmologic negative   ENMT negative   Respiratory and Thorax negative   Cardiovascular negative   Gastrointestinal negative   Genitourinary negative   Musculoskeletal negative   Neurological negative   Psychiatric negative   Hematology/Lymphatics negative   Endocrine negative   Allergic/Immunologic negative   Review of Systems   except for hoarsenessdenies any weight loss, fatigue, weakness, fever, chills or night sweats. Patient denies any loss of vision, blurred vision. Patient denies any ringing  of the ears or hearing loss. No irregular heartbeat. Patient denies heart murmur or history of fainting. Patient denies any chest pain or pain radiating to her upper extremities. Patient denies any shortness of breath, difficulty breathing at night, cough or hemoptysis. Patient denies any swelling in the lower legs. Patient denies any  nausea vomiting, vomiting of blood, or coffee ground material in the vomitus. Patient denies any stomach pain. Patient states has had normal bowel movements no significant constipation or diarrhea. Patient denies any dysuria, hematuria or significant nocturia. Patient denies any problems walking,  swelling in the joints or loss of balance. Patient denies any skin changes, loss of hair or loss of weight. Patient denies any excessive worrying or anxiety or significant depression. Patient denies any problems with insomnia. Patient denies excessive thirst, polyuria, polydipsia. Patient denies any swollen glands, patient denies easy bruising or easy bleeding. Patient denies any recent infections, allergies or URI. Patient "s visual fields have not changed significantly in recent time.   Nursing Notes:  Nursing Vital Signs and Chemo Nursing Nursing Notes: *CC Vital Signs Flowsheet:   07-Oct-15 10:28  Temp Temperature 97.1  Pulse Pulse 61  Respirations Respirations 20  SBP SBP 111  DBP DBP 69  Pain Scale (0-10)  0  Current Weight (kg) (kg) 90.9   Physical Exam:  General/Skin/HEENT:  General normal   Skin normal   Eyes normal   Additional PE well-developed well-nourished male in NAD. Oral cavity is clear no oral mucosal lesions are identified indirect mirror examination shows some irregularity of the right true cord vallecula base of tongue within normal limits. No evidence of sub-digastric cervical or supra-clavicular adenopathy.   Breasts/Resp/CV/GI/GU:  Respiratory and Thorax normal   Cardiovascular normal   Gastrointestinal normal   Genitourinary normal   MS/Neuro/Psych/Lymph:  Musculoskeletal normal   Neurological normal   Lymphatics normal   Assessment and Plan: Impression:   probable early stage I squamous cell carcinoma of the larynx in a 51-year-old male. Plan:   for completeness sake have ordered a CT scan with contrast of the head and neck region for staging purposes. Would be very surprised if this is anything more than a T1 lesion. Would plan on delivering up to 7000 cGy to his larynx. Risks and benefits of treatment including initial worsening of his hoarseness, possible dysphasia, skin reaction in the neck region, all were explained in detail to the  patient. We'll avoid the salivary glands totally to avoid any chance of xerostomia. I have set him up for CT simulation the following week after his CT scan.  I would like to take this opportunity for allowing me to participate in the care of your patient..  Fax to Physician:  Physicians To Recieve Fax: Isaias Cowman, MD - [phone] Riley Nearing - [phone].  Electronic Signatures: Armstead Peaks (MD)  (Signed 07-Oct-15 11:03)  Authored: HPI, Diagnosis, Past Hx, PFSH, Allergies, Home Meds, ROS, Nursing Notes, Physical Exam, Encounter Assessment and Plan, Fax to Physician   Last Updated: 07-Oct-15 11:03 by Armstead Peaks (MD)

## 2015-05-04 ENCOUNTER — Ambulatory Visit
Admission: RE | Admit: 2015-05-04 | Discharge: 2015-05-04 | Disposition: A | Discharge status: Home / Self Care | Payer: Medicare Other | Source: Ambulatory Visit | Attending: Radiation Oncology | Admitting: Radiation Oncology

## 2015-05-04 ENCOUNTER — Encounter: Payer: Self-pay | Admitting: Radiation Oncology

## 2015-05-04 VITALS — BP 108/67 | HR 56 | Temp 98.6°F | Wt 198.3 lb

## 2015-05-04 DIAGNOSIS — C329 Malignant neoplasm of larynx, unspecified: Secondary | ICD-10-CM

## 2015-05-04 NOTE — Progress Notes (Signed)
Patient here for a 6 month f/u visit.  He states that everything is going fine, he is able to eat and is still being followed by the ENT.

## 2015-05-04 NOTE — Progress Notes (Signed)
Radiation Oncology Follow up Note  Name: Louis Harrison   Date:   05/04/2015 MRN:  [id number] DOB: [birth year]    This 81 y.o. male presents to the clinic today for follow up with head and neck cancer.  REFERRING PROVIDER: Dr. 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 Seen today in routine follow-up he is doing well. He specifically denies head and neck pain or dysphagia. Still has somewhat of a raspy voice. He is being followed closely by ENT who the patient reports shows no evidence of disease..  COMPLICATIONS OF TREATMENT: none  FOLLOW UP COMPLIANCE: keeps appointments   PHYSICAL EXAM:  BP 108/67 mmHg  Pulse 56  Temp(Src) 98.6 F (37 C)  Wt 198 lb 4.9 oz (89.95 kg) Oral cavity is clear no oral mucosal lesions are identified indirect mirror examination shows upper airway clear vallecula and base of tongue within normal limits. Still looks like some edema of both true cords although although there are proximal meeting well no evidence of nodularity or masses noted. No evidence of subject gastric cervical or supraclavicular adenopathy is appreciated. I would like to take this opportunity for allowing me to participate in the care of your patient.Marland Kitchen  RADIOLOGY RESULTS: no current films to review  PLAN: present time 6 months out he continues to do well with no evidence of disease. I am please was overall progress. He continues close follow-up care with ENT. I have asked to see him back in 1 year for follow-up. Patient knows to call sooner with any concerns.  I would like to take this opportunity for allowing me to participate in the care of your patient.Armstead Peaks., MD

## 2015-11-16 ENCOUNTER — Ambulatory Visit
Admission: RE | Admit: 2015-11-16 | Discharge: 2015-11-16 | Disposition: A | Discharge status: Home / Self Care | Payer: Medicare Other | Source: Ambulatory Visit | Attending: Radiation Oncology | Admitting: Radiation Oncology

## 2015-11-16 ENCOUNTER — Encounter: Payer: Self-pay | Admitting: Radiation Oncology

## 2015-11-16 VITALS — BP 143/84 | HR 56 | Temp 94.7°F | Resp 18 | Wt 206.7 lb

## 2015-11-16 DIAGNOSIS — C329 Malignant neoplasm of larynx, unspecified: Secondary | ICD-10-CM

## 2015-11-16 HISTORY — DX: Malignant (primary) neoplasm, unspecified: C80.1

## 2015-11-16 NOTE — Progress Notes (Signed)
Radiation Oncology Follow up Note  Name: Louis Harrison   Date:   11/16/2015 MRN:  [id number] DOB: 09/25/1928    This 82 y.o. male presents to the clinic today for follow-up for T1 basal squamous cell carcinoma the larynx now out 1 year.  REFERRING PROVIDER: No ref. provider found  HPI: Patient is an 82 year old male now out 1 year having completed external beam radiation therapy for a basosquamous T1 carcinoma the larynx. He is seen today in routine follow-up is doing well still states he has a lot of phlegm. Also ENT noticed an area about 6 months ago of some erythematous changes which has improved over time although they're continuing to monitor that area. He's having no dysphagia or head and neck pain. He does use Mucinex. He also continues to gargle with salt and baking soda although I told him he can stop doing that at this time considered since it is not affecting his larynx..  COMPLICATIONS OF TREATMENT: none  FOLLOW UP COMPLIANCE: keeps appointments   PHYSICAL EXAM:  BP 143/84 mmHg  Pulse 56  Temp(Src) 94.7 F (34.8 C)  Resp 18  Wt 206 lb 10.9 oz (93.75 kg) Oral cavity is clear no oral mucosal lesions are identified. Indirect mirror examination shows cord approximately well there is some persistent edema in the larynx noted. Vallecula and base of tongue are within normal limits neck is clear without evidence of subject gastric cervical or supraclavicular adenopathy. Well-developed well-nourished patient in NAD. HEENT reveals PERLA, EOMI, discs not visualized.  Oral cavity is clear. No oral mucosal lesions are identified. Neck is clear without evidence of cervical or supraclavicular adenopathy. Lungs are clear to A&P. Cardiac examination is essentially unremarkable with regular rate and rhythm without murmur rub or thrill. Abdomen is benign with no organomegaly or masses noted. Motor sensory and DTR levels are equal and symmetric in the upper and lower extremities. Cranial nerves  II through XII are grossly intact. Proprioception is intact. No peripheral adenopathy or edema is identified. No motor or sensory levels are noted. Crude visual fields are within normal range.  RADIOLOGY RESULTS: No films for review  PLAN: Present time he is doing well with no evidence of disease continues close follow-up care with ENT. I have asked to see him back in 6 months for follow-up. I've asked him to drink plenty of fluids and to use the Mucinex on a regular basis. He's having no dysphagia at this time. I'm please was overall progress.  I would like to take this opportunity for allowing me to participate in the care of your patient.Armstead Peaks., MD

## 2016-01-06 ENCOUNTER — Ambulatory Visit (INDEPENDENT_AMBULATORY_CARE_PROVIDER_SITE_OTHER): Payer: Medicare Other

## 2016-01-06 ENCOUNTER — Ambulatory Visit (INDEPENDENT_AMBULATORY_CARE_PROVIDER_SITE_OTHER): Payer: Medicare Other | Admitting: Podiatry

## 2016-01-06 ENCOUNTER — Encounter: Payer: Self-pay | Admitting: Podiatry

## 2016-01-06 VITALS — BP 138/74 | HR 78 | Resp 18

## 2016-01-06 DIAGNOSIS — R300 Dysuria: Secondary | ICD-10-CM | POA: Insufficient documentation

## 2016-01-06 DIAGNOSIS — M10079 Idiopathic gout, unspecified ankle and foot: Secondary | ICD-10-CM

## 2016-01-06 DIAGNOSIS — M109 Gout, unspecified: Secondary | ICD-10-CM

## 2016-01-06 DIAGNOSIS — M7750 Other enthesopathy of unspecified foot: Secondary | ICD-10-CM | POA: Diagnosis not present

## 2016-01-06 DIAGNOSIS — I10 Essential (primary) hypertension: Secondary | ICD-10-CM | POA: Insufficient documentation

## 2016-01-06 DIAGNOSIS — M778 Other enthesopathies, not elsewhere classified: Secondary | ICD-10-CM

## 2016-01-06 DIAGNOSIS — M779 Enthesopathy, unspecified: Secondary | ICD-10-CM

## 2016-01-06 DIAGNOSIS — R319 Hematuria, unspecified: Secondary | ICD-10-CM | POA: Insufficient documentation

## 2016-01-06 MED ORDER — INDOMETHACIN ER 75 MG PO CPCR: 75.0000 mg | ORAL_CAPSULE | Freq: Two times a day (BID) | ORAL | Status: DC

## 2016-01-06 NOTE — Progress Notes (Signed)
   Subjective:    Patient ID: Louis Harrison, male    DOB: Aug 27, 1928, 82 y.o.   MRN: [id number]  HPI: He presents today with his wife with a chief complaint of a gout flareup. He states that he has had gout for many years and these large nodules have formed which occasionally open and drain and alleviate his symptoms. He states that recently they have been red and swollen and painful. He has been taking his indomethacin which she states is an old outdated prescription but works well. He states that his primary doctor provided him with this some years ago. He would currently like an injection of possible to help alleviate his symptoms    Review of Systems  Musculoskeletal: Positive for arthralgias.  All other systems reviewed and are negative.      Objective:   Physical Exam: I have reviewed his past medical history medications allergies surgery social history and review of systems. Vital signs are stable he is alert and oriented 3 in no apparent distress. Pulses are strongly palpable neurologic sensorium is intact per Semmes-Weinstein monofilament. Deep tendon reflexes are intact muscle strength is normal bilateral. Orthopedic evaluation does demonstrate mild tenderness and pain on palpation and range of motion of the first metatarsophalangeal joints bilaterally he has a very large accumulation of uric acid or a tophus to the first metatarsophalangeal joint of the right foot and to a lesser degree the medial aspect of the hallux interphalangeal joint of the left foot. He states that this area, the left foot will open and drains and once it drains it alleviates the pain. Radiographs taken today demonstrate severe tophus buildup bilaterally in an otherwise arthritic foot.        Assessment & Plan:  Assessment: Capsulitis gouty capsulitis bilateral foot.  Plan: I renewed his prescription for indomethacin. I also injected periarticular around the first metatarsophalangeal joint bilaterally.  Follow up with him as needed.

## 2016-03-01 ENCOUNTER — Other Ambulatory Visit: Payer: Self-pay | Admitting: Nurse Practitioner

## 2016-03-01 ENCOUNTER — Ambulatory Visit
Admission: RE | Admit: 2016-03-01 | Discharge: 2016-03-01 | Disposition: A | Discharge status: Home / Self Care | Payer: Medicare Other | Source: Ambulatory Visit | Attending: Nurse Practitioner | Admitting: Nurse Practitioner

## 2016-03-01 DIAGNOSIS — M25461 Effusion, right knee: Secondary | ICD-10-CM | POA: Diagnosis not present

## 2016-03-01 DIAGNOSIS — M1711 Unilateral primary osteoarthritis, right knee: Secondary | ICD-10-CM | POA: Insufficient documentation

## 2016-03-01 DIAGNOSIS — M25561 Pain in right knee: Secondary | ICD-10-CM | POA: Diagnosis present

## 2016-03-01 DIAGNOSIS — M11261 Other chondrocalcinosis, right knee: Secondary | ICD-10-CM | POA: Diagnosis not present

## 2016-05-09 ENCOUNTER — Encounter: Payer: Self-pay | Admitting: Radiation Oncology

## 2016-05-09 ENCOUNTER — Ambulatory Visit
Admission: RE | Admit: 2016-05-09 | Discharge: 2016-05-09 | Disposition: A | Discharge status: Home / Self Care | Payer: Medicare Other | Source: Ambulatory Visit | Attending: Radiation Oncology | Admitting: Radiation Oncology

## 2016-05-09 VITALS — BP 126/69 | HR 51 | Temp 94.7°F | Resp 20 | Wt 198.1 lb

## 2016-05-09 DIAGNOSIS — C329 Malignant neoplasm of larynx, unspecified: Secondary | ICD-10-CM

## 2016-05-09 DIAGNOSIS — Z923 Personal history of irradiation: Secondary | ICD-10-CM | POA: Diagnosis not present

## 2016-05-09 DIAGNOSIS — Z8521 Personal history of malignant neoplasm of larynx: Secondary | ICD-10-CM | POA: Insufficient documentation

## 2016-05-09 NOTE — Progress Notes (Signed)
Radiation Oncology Follow up Note  Name: Louis Harrison   Date:   05/09/2016 MRN:  [id number] DOB: 12/21/1927    This 82 y.o. 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  REFERRING PROVIDER: No ref. 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 He is seen today in routine follow-up is doing well he's having no dysphagia or head and neck pain. Voice is still raspy. He continues close follow-up care with ENT who the patient states there is no evidence of disease..  COMPLICATIONS OF TREATMENT: none  FOLLOW UP COMPLIANCE: keeps appointments   PHYSICAL EXAM:  BP 126/69 mmHg  Pulse 51  Temp(Src) 94.7 F (34.8 C)  Resp 20  Wt 198 lb 1.3 oz (89.85 kg) Oral cavity is clear no oral mucosal lesions are identified indirect mirror examination shows cord approximately well still some laryngeal edema is noted. Neck is clear without evidence of subject gastric cervical or supraclavicular adenopathy. Well-developed well-nourished patient in NAD. HEENT reveals PERLA, EOMI, discs not visualized.  Oral cavity is clear. No oral mucosal lesions are identified. Neck is clear without evidence of cervical or supraclavicular adenopathy. Lungs are clear to A&P. Cardiac examination is essentially unremarkable with regular rate and rhythm without murmur rub or thrill. Abdomen is benign with no organomegaly or masses noted. Motor sensory and DTR levels are equal and symmetric in the upper and lower extremities. Cranial nerves II through XII are grossly intact. Proprioception is intact. No peripheral adenopathy or edema is identified. No motor or sensory levels are noted. Crude visual fields are within normal range.  RADIOLOGY RESULTS: No current films for review I've asked the patient to have yearly chest x-rays by his PMD  PLAN:  Present time patient continues to do well with no evidence of disease. He continues close follow-up care with ENT. I'm please was overall progress. I've asked to see him back in 1 year for follow-up. Patient is to call sooner with any concerns.  I would like to take this opportunity to thank you for allowing me to participate in the care of your patient.Armstead Peaks., MD

## 2016-09-16 ENCOUNTER — Emergency Department: Payer: No Typology Code available for payment source

## 2016-09-16 ENCOUNTER — Inpatient Hospital Stay
Admission: EM | Admit: 2016-09-16 | Discharge: 2016-09-19 | DRG: 189 | Disposition: A | Discharge status: Home Health | Payer: No Typology Code available for payment source | Attending: Internal Medicine | Admitting: Internal Medicine

## 2016-09-16 ENCOUNTER — Encounter: Payer: Self-pay | Admitting: Radiology

## 2016-09-16 DIAGNOSIS — Y92410 Unspecified street and highway as the place of occurrence of the external cause: Secondary | ICD-10-CM

## 2016-09-16 DIAGNOSIS — R262 Difficulty in walking, not elsewhere classified: Secondary | ICD-10-CM

## 2016-09-16 DIAGNOSIS — Z79899 Other long term (current) drug therapy: Secondary | ICD-10-CM

## 2016-09-16 DIAGNOSIS — J9811 Atelectasis: Secondary | ICD-10-CM | POA: Diagnosis not present

## 2016-09-16 DIAGNOSIS — I129 Hypertensive chronic kidney disease with stage 1 through stage 4 chronic kidney disease, or unspecified chronic kidney disease: Secondary | ICD-10-CM | POA: Diagnosis present

## 2016-09-16 DIAGNOSIS — C329 Malignant neoplasm of larynx, unspecified: Secondary | ICD-10-CM | POA: Diagnosis present

## 2016-09-16 DIAGNOSIS — E785 Hyperlipidemia, unspecified: Secondary | ICD-10-CM | POA: Diagnosis present

## 2016-09-16 DIAGNOSIS — Z955 Presence of coronary angioplasty implant and graft: Secondary | ICD-10-CM | POA: Diagnosis not present

## 2016-09-16 DIAGNOSIS — N183 Chronic kidney disease, stage 3 (moderate): Secondary | ICD-10-CM | POA: Diagnosis present

## 2016-09-16 DIAGNOSIS — R0902 Hypoxemia: Secondary | ICD-10-CM

## 2016-09-16 DIAGNOSIS — J438 Other emphysema: Secondary | ICD-10-CM | POA: Diagnosis not present

## 2016-09-16 DIAGNOSIS — J9601 Acute respiratory failure with hypoxia: Secondary | ICD-10-CM | POA: Diagnosis not present

## 2016-09-16 DIAGNOSIS — J439 Emphysema, unspecified: Secondary | ICD-10-CM | POA: Diagnosis present

## 2016-09-16 DIAGNOSIS — I252 Old myocardial infarction: Secondary | ICD-10-CM | POA: Diagnosis not present

## 2016-09-16 DIAGNOSIS — Z87891 Personal history of nicotine dependence: Secondary | ICD-10-CM | POA: Diagnosis not present

## 2016-09-16 HISTORY — DX: Atherosclerosis of renal artery: I70.1

## 2016-09-16 HISTORY — DX: Chronic obstructive pulmonary disease, unspecified: J44.9

## 2016-09-16 LAB — BLOOD GAS, ARTERIAL
Acid-base deficit: 0.4 mmol/L (ref 0.0–2.0)
BICARBONATE: 25.4 mmol/L (ref 20.0–28.0)
FIO2: 0.21
O2 SAT: 71 %
PCO2 ART: 45 mmHg (ref 32.0–48.0)
PO2 ART: 39 mmHg — AB (ref 83.0–108.0)
Patient temperature: 37
pH, Arterial: 7.36 (ref 7.350–7.450)

## 2016-09-16 LAB — URINALYSIS COMPLETE WITH MICROSCOPIC (ARMC ONLY)
BACTERIA UA: NONE SEEN
Bilirubin Urine: NEGATIVE
Glucose, UA: NEGATIVE mg/dL
Ketones, ur: NEGATIVE mg/dL
Leukocytes, UA: NEGATIVE
NITRITE: NEGATIVE
PROTEIN: 30 mg/dL — AB
SPECIFIC GRAVITY, URINE: 1.033 — AB (ref 1.005–1.030)
SQUAMOUS EPITHELIAL / LPF: NONE SEEN
pH: 5 (ref 5.0–8.0)

## 2016-09-16 LAB — CBC WITH DIFFERENTIAL/PLATELET
Basophils Absolute: 0.1 10*3/uL (ref 0–0.1)
Basophils Relative: 1 %
EOS PCT: 1 %
Eosinophils Absolute: 0 10*3/uL (ref 0–0.7)
HCT: 42.9 % (ref 40.0–52.0)
Hemoglobin: 14.6 g/dL (ref 13.0–18.0)
LYMPHS ABS: 0.4 10*3/uL — AB (ref 1.0–3.6)
LYMPHS PCT: 4 %
MCH: 33.3 pg (ref 26.0–34.0)
MCHC: 34.1 g/dL (ref 32.0–36.0)
MCV: 97.4 fL (ref 80.0–100.0)
MONO ABS: 0.7 10*3/uL (ref 0.2–1.0)
Monocytes Relative: 7 %
Neutro Abs: 8.8 10*3/uL — ABNORMAL HIGH (ref 1.4–6.5)
Neutrophils Relative %: 87 %
PLATELETS: 105 10*3/uL — AB (ref 150–440)
RBC: 4.4 MIL/uL (ref 4.40–5.90)
RDW: 14.3 % (ref 11.5–14.5)
WBC: 10 10*3/uL (ref 3.8–10.6)

## 2016-09-16 LAB — COMPREHENSIVE METABOLIC PANEL
ALT: 15 U/L — AB (ref 17–63)
AST: 26 U/L (ref 15–41)
Albumin: 3.8 g/dL (ref 3.5–5.0)
Alkaline Phosphatase: 60 U/L (ref 38–126)
Anion gap: 7 (ref 5–15)
BUN: 20 mg/dL (ref 6–20)
CHLORIDE: 104 mmol/L (ref 101–111)
CO2: 27 mmol/L (ref 22–32)
CREATININE: 1.53 mg/dL — AB (ref 0.61–1.24)
Calcium: 8.6 mg/dL — ABNORMAL LOW (ref 8.9–10.3)
GFR, EST AFRICAN AMERICAN: 46 mL/min — AB (ref >60)
GFR, EST NON AFRICAN AMERICAN: 39 mL/min — AB (ref >60)
Glucose, Bld: 108 mg/dL — ABNORMAL HIGH (ref 65–99)
POTASSIUM: 4.2 mmol/L (ref 3.5–5.1)
Sodium: 138 mmol/L (ref 135–145)
Total Bilirubin: 0.5 mg/dL (ref 0.3–1.2)
Total Protein: 6.5 g/dL (ref 6.5–8.1)

## 2016-09-16 LAB — TROPONIN I

## 2016-09-16 LAB — MAGNESIUM: Magnesium: 1.8 mg/dL (ref 1.7–2.4)

## 2016-09-16 MED ORDER — ONDANSETRON HCL 4 MG PO TABS: 4.0000 mg | ORAL_TABLET | Freq: Four times a day (QID) | ORAL | Status: DC | PRN

## 2016-09-16 MED ORDER — ASPIRIN 325 MG PO TABS: 325.0000 mg | ORAL_TABLET | Freq: Every day | ORAL | Status: DC

## 2016-09-16 MED ORDER — KETOROLAC TROMETHAMINE 30 MG/ML IJ SOLN
15.0000 mg | Freq: Once | INTRAMUSCULAR | Status: AC
Start: 2016-09-16 — End: 2016-09-16
  Administered 2016-09-16: 15 mg via INTRAVENOUS

## 2016-09-16 MED ORDER — ASPIRIN EC 325 MG PO TBEC
325.0000 mg | DELAYED_RELEASE_TABLET | Freq: Every day | ORAL | Status: DC
  Administered 2016-09-17 – 2016-09-19 (×3): 325 mg via ORAL
  Filled 2016-09-16 (×3): qty 1

## 2016-09-16 MED ORDER — ALBUTEROL SULFATE (2.5 MG/3ML) 0.083% IN NEBU
2.5000 mg | INHALATION_SOLUTION | RESPIRATORY_TRACT | Status: DC | PRN
  Administered 2016-09-17: 2.5 mg via RESPIRATORY_TRACT
  Filled 2016-09-16: qty 3

## 2016-09-16 MED ORDER — HEPARIN SODIUM (PORCINE) 5000 UNIT/ML IJ SOLN
5000.0000 [IU] | Freq: Three times a day (TID) | INTRAMUSCULAR | Status: DC
  Administered 2016-09-16 – 2016-09-19 (×8): 5000 [IU] via SUBCUTANEOUS
  Filled 2016-09-16 (×8): qty 1

## 2016-09-16 MED ORDER — SIMVASTATIN 20 MG PO TABS
20.0000 mg | ORAL_TABLET | Freq: Every day | ORAL | Status: DC
  Administered 2016-09-17 – 2016-09-19 (×3): 20 mg via ORAL
  Filled 2016-09-16 (×3): qty 1

## 2016-09-16 MED ORDER — TRAMADOL HCL 50 MG PO TABS
50.0000 mg | ORAL_TABLET | Freq: Four times a day (QID) | ORAL | Status: DC | PRN
  Administered 2016-09-16: 50 mg via ORAL
  Filled 2016-09-16: qty 1

## 2016-09-16 MED ORDER — PANTOPRAZOLE SODIUM 40 MG PO TBEC
40.0000 mg | DELAYED_RELEASE_TABLET | Freq: Every day | ORAL | Status: DC
  Administered 2016-09-17 – 2016-09-19 (×3): 40 mg via ORAL
  Filled 2016-09-16 (×3): qty 1

## 2016-09-16 MED ORDER — ATENOLOL 25 MG PO TABS
25.0000 mg | ORAL_TABLET | Freq: Every day | ORAL | Status: DC
  Administered 2016-09-17 – 2016-09-19 (×3): 25 mg via ORAL
  Filled 2016-09-16 (×4): qty 1

## 2016-09-16 MED ORDER — SODIUM CHLORIDE 0.9% FLUSH: 3.0000 mL | INTRAVENOUS | Status: DC | PRN

## 2016-09-16 MED ORDER — ISOSORBIDE MONONITRATE ER 30 MG PO TB24
30.0000 mg | ORAL_TABLET | Freq: Every day | ORAL | Status: DC
  Administered 2016-09-17 – 2016-09-19 (×3): 30 mg via ORAL
  Filled 2016-09-16 (×3): qty 1

## 2016-09-16 MED ORDER — KETOROLAC TROMETHAMINE 30 MG/ML IJ SOLN
INTRAMUSCULAR | Status: AC
  Administered 2016-09-16: 15 mg via INTRAVENOUS
  Filled 2016-09-16: qty 1

## 2016-09-16 MED ORDER — IOPAMIDOL (ISOVUE-370) INJECTION 76%
60.0000 mL | Freq: Once | INTRAVENOUS | Status: AC | PRN
  Administered 2016-09-16: 60 mL via INTRAVENOUS

## 2016-09-16 MED ORDER — IPRATROPIUM-ALBUTEROL 0.5-2.5 (3) MG/3ML IN SOLN
3.0000 mL | Freq: Four times a day (QID) | RESPIRATORY_TRACT | Status: DC
  Administered 2016-09-16 – 2016-09-17 (×4): 3 mL via RESPIRATORY_TRACT
  Filled 2016-09-16 (×5): qty 3

## 2016-09-16 MED ORDER — SODIUM CHLORIDE 0.9 % IV SOLN: 250.0000 mL | INTRAVENOUS | Status: DC | PRN

## 2016-09-16 MED ORDER — ONDANSETRON HCL 4 MG/2ML IJ SOLN: 4.0000 mg | Freq: Four times a day (QID) | INTRAMUSCULAR | Status: DC | PRN

## 2016-09-16 MED ORDER — SODIUM CHLORIDE 0.9% FLUSH
3.0000 mL | Freq: Two times a day (BID) | INTRAVENOUS | Status: DC
  Administered 2016-09-16 – 2016-09-18 (×3): 3 mL via INTRAVENOUS

## 2016-09-16 MED ORDER — IPRATROPIUM-ALBUTEROL 0.5-2.5 (3) MG/3ML IN SOLN
RESPIRATORY_TRACT | Status: AC
  Administered 2016-09-16: 3 mL via RESPIRATORY_TRACT
  Filled 2016-09-16: qty 3

## 2016-09-16 MED ORDER — METHYLPREDNISOLONE SODIUM SUCC 40 MG IJ SOLR
40.0000 mg | Freq: Two times a day (BID) | INTRAMUSCULAR | Status: DC
  Administered 2016-09-16 – 2016-09-19 (×6): 40 mg via INTRAVENOUS
  Filled 2016-09-16 (×6): qty 1

## 2016-09-16 MED ORDER — AMLODIPINE BESYLATE 5 MG PO TABS
2.5000 mg | ORAL_TABLET | Freq: Every day | ORAL | Status: DC
  Administered 2016-09-17: 2.5 mg via ORAL
  Administered 2016-09-18 – 2016-09-19 (×2): 5 mg via ORAL
  Filled 2016-09-16 (×3): qty 1

## 2016-09-16 MED ORDER — SODIUM CHLORIDE 0.9 % IV BOLUS (SEPSIS)
500.0000 mL | Freq: Once | INTRAVENOUS | Status: AC
  Administered 2016-09-16: 500 mL via INTRAVENOUS

## 2016-09-16 MED ORDER — LISINOPRIL 5 MG PO TABS
5.0000 mg | ORAL_TABLET | Freq: Every day | ORAL | Status: DC
  Administered 2016-09-17 – 2016-09-19 (×3): 5 mg via ORAL
  Filled 2016-09-16 (×3): qty 1

## 2016-09-16 MED ORDER — TRAMADOL HCL 50 MG PO TABS
50.0000 mg | ORAL_TABLET | Freq: Once | ORAL | Status: AC
  Administered 2016-09-16: 50 mg via ORAL
  Filled 2016-09-16: qty 1

## 2016-09-16 MED ORDER — ACETAMINOPHEN 325 MG PO TABS: 650.0000 mg | ORAL_TABLET | Freq: Four times a day (QID) | ORAL | Status: DC | PRN

## 2016-09-16 MED ORDER — ACETAMINOPHEN 650 MG RE SUPP: 650.0000 mg | Freq: Four times a day (QID) | RECTAL | Status: DC | PRN

## 2016-09-16 MED ORDER — TAMSULOSIN HCL 0.4 MG PO CAPS
0.4000 mg | ORAL_CAPSULE | Freq: Every day | ORAL | Status: DC
  Administered 2016-09-17 – 2016-09-19 (×3): 0.4 mg via ORAL
  Filled 2016-09-16 (×3): qty 1

## 2016-09-16 MED ORDER — INDOMETHACIN ER 75 MG PO CPCR
75.0000 mg | ORAL_CAPSULE | Freq: Two times a day (BID) | ORAL | Status: DC
  Administered 2016-09-16 – 2016-09-18 (×5): 75 mg via ORAL
  Filled 2016-09-16 (×7): qty 1

## 2016-09-16 MED ORDER — IPRATROPIUM-ALBUTEROL 0.5-2.5 (3) MG/3ML IN SOLN
3.0000 mL | Freq: Once | RESPIRATORY_TRACT | Status: AC
Start: 2016-09-16 — End: 2016-09-16
  Administered 2016-09-16: 3 mL via RESPIRATORY_TRACT
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
## 2016-09-16 NOTE — ED Notes (Signed)
Patient transported to CT 

## 2016-09-16 NOTE — ED Notes (Signed)
Patient transported to X-ray 

## 2016-09-16 NOTE — Progress Notes (Signed)
Patient was admitted to room 153 from ER. Patient ambulated to bed with an unsteady gait. On 6L O2, sats92%. A&O x4, HOH. Scab to right great toe. Swollen joint from gout, per patient. SCDs applied. Bed alarm on for safety. Oriented to room, call light, TV and bed controls. Voice harsh, with non-productive cough.

## 2016-09-16 NOTE — ED Notes (Signed)
Per MD pt to be left on RA until ABG is finished
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
## 2016-09-16 NOTE — ED Notes (Signed)
Pts RA saturation noted to be 75% on RA, placed on non-rebreather and EDP made aware, verbal orders received for duoneb

## 2016-09-17 DIAGNOSIS — J9601 Acute respiratory failure with hypoxia: Principal | ICD-10-CM

## 2016-09-17 DIAGNOSIS — J438 Other emphysema: Secondary | ICD-10-CM

## 2016-09-17 DIAGNOSIS — J9811 Atelectasis: Secondary | ICD-10-CM

## 2016-09-17 LAB — CBC
HEMATOCRIT: 42.7 % (ref 40.0–52.0)
HEMOGLOBIN: 14.6 g/dL (ref 13.0–18.0)
MCH: 33.9 pg (ref 26.0–34.0)
MCHC: 34.3 g/dL (ref 32.0–36.0)
MCV: 98.9 fL (ref 80.0–100.0)
Platelets: 109 10*3/uL — ABNORMAL LOW (ref 150–440)
RBC: 4.32 MIL/uL — ABNORMAL LOW (ref 4.40–5.90)
RDW: 14 % (ref 11.5–14.5)
WBC: 6.7 10*3/uL (ref 3.8–10.6)

## 2016-09-17 LAB — BASIC METABOLIC PANEL
ANION GAP: 7 (ref 5–15)
BUN: 19 mg/dL (ref 6–20)
CHLORIDE: 103 mmol/L (ref 101–111)
CO2: 26 mmol/L (ref 22–32)
Calcium: 8.2 mg/dL — ABNORMAL LOW (ref 8.9–10.3)
Creatinine, Ser: 1.34 mg/dL — ABNORMAL HIGH (ref 0.61–1.24)
GFR calc Af Amer: 54 mL/min — ABNORMAL LOW (ref >60)
GFR calc non Af Amer: 46 mL/min — ABNORMAL LOW (ref >60)
GLUCOSE: 139 mg/dL — AB (ref 65–99)
POTASSIUM: 4.4 mmol/L (ref 3.5–5.1)
Sodium: 136 mmol/L (ref 135–145)

## 2016-09-17 MED ORDER — METHYLPREDNISOLONE SODIUM SUCC 40 MG IJ SOLR: 40.0000 mg | Freq: Two times a day (BID) | INTRAMUSCULAR | Status: DC

## 2016-09-17 MED ORDER — HYDRALAZINE HCL 20 MG/ML IJ SOLN: 10.0000 mg | Freq: Four times a day (QID) | INTRAMUSCULAR | Status: DC | PRN

## 2016-09-17 NOTE — Progress Notes (Signed)
BP elevated 180/89.  Consistently elevated through shift.  MD notified.  Waiting response.  Pt is scheduled for regular home BP meds this am.

## 2016-09-17 NOTE — Progress Notes (Signed)
In response to high BP notice.  MD Pyreddy said to go ahead and give AM scheduled BP meds.
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
## 2016-09-18 MED ORDER — MOMETASONE FURO-FORMOTEROL FUM 100-5 MCG/ACT IN AERO
2.0000 | INHALATION_SPRAY | Freq: Two times a day (BID) | RESPIRATORY_TRACT | Status: DC
  Administered 2016-09-18 – 2016-09-19 (×3): 2 via RESPIRATORY_TRACT
  Filled 2016-09-18: qty 8.8

## 2016-09-18 MED ORDER — IPRATROPIUM-ALBUTEROL 0.5-2.5 (3) MG/3ML IN SOLN
3.0000 mL | Freq: Three times a day (TID) | RESPIRATORY_TRACT | Status: DC
  Administered 2016-09-18 – 2016-09-19 (×4): 3 mL via RESPIRATORY_TRACT
  Filled 2016-09-18 (×4): qty 3
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
## 2016-09-19 ENCOUNTER — Telehealth: Payer: Self-pay | Admitting: Pulmonary Disease

## 2016-09-19 LAB — CARBON MONOXIDE, BLOOD (PERFORMED AT REF LAB): Carbon Monoxide, Blood: 3 % (ref 0.0–3.6)

## 2016-09-19 MED ORDER — MOMETASONE FURO-FORMOTEROL FUM 100-5 MCG/ACT IN AERO: 2.0000 | INHALATION_SPRAY | Freq: Two times a day (BID) | RESPIRATORY_TRACT | 0 refills | Status: DC

## 2016-09-19 MED ORDER — PREDNISONE 50 MG PO TABS: 50.0000 mg | ORAL_TABLET | Freq: Every day | ORAL | 0 refills | Status: DC

## 2016-09-19 NOTE — Telephone Encounter (Signed)
Pt was discharged 09/19/16 with Rx for dulera. Ruthe Mannan is not covered by pt insurance, however flovent 110 2 puffs bid is a covered alternative. Pt is scheduled for hosp f/u with DS on 09/27/16.  DS please advise if okay to switch to flovent. Thanks.

## 2016-09-19 NOTE — Care Management (Signed)
SATURATION QUALIFICATIONS: (This note is used to comply with regulatory documentation for home oxygen)  Patient Saturations on Room Air at Rest = 88%  Patient Saturations on Room Air while Ambulating   Patient Saturations on Liters of oxygen while Ambulating   Please briefly explain why patient needs home oxygen:Hypoxia, respiratory failure and COPD

## 2016-09-19 NOTE — Care Management Note (Signed)
Case Management Note  Patient Details  Name: [name] MRN: [id number] Date of Birth: Oct 15, 1928  Subjective/Objective:     CM consult for discharge planning. Met with patient and his wife Pamala Hurry at bedside.PAtient lives at home with his wife.  Discussed home health services. No agency preference. [facility] for [facility]. Wife refused a HHA. Patient has a walker at home.  Prior to admission, patient was independent and driving. Will need home O2. Primary RN to get qualifying O2 sats. HX: acute respiratory failure , hypoxia and COPD. Ordered from [facility].                            Action/Plan: RN and PT with home O2 through [facility]  Expected Discharge Date:   11/27/217             Expected Discharge Plan:  [place]  In-House Referral:     Discharge planning Services  CM Consult  Post Acute Care Choice:  Durable Medical Equipment, Home Health Choice offered to:  Patient, Spouse  DME Arranged:  Oxygen DME Agency:  [place] Arranged:  RN, PT [facility] Agency:  [place]  Status of Service:  Completed, signed off  If discussed at [place] of Stay Meetings, dates discussed:    Additional Comments:  Jolly Mango, RN 09/19/2016, 9:19 AM

## 2016-09-19 NOTE — Care Management Important Message (Signed)
Important Message  Patient Details  Name: Louis Harrison MRN: [id number] Date of Birth: [birth year]   Medicare Important Message Given:  Yes    Jolly Mango, RN 09/19/2016, 9:02 AM

## 2016-09-19 NOTE — Discharge Summary (Signed)
[place] at [place] NAME: Louis Harrison    MR#:  [id number]  DATE OF BIRTH:  September 02, 1928  DATE OF ADMISSION:  09/16/2016 ADMITTING PHYSICIAN: Demetrios Loll, MD  DATE OF DISCHARGE: 09/19/2016  PRIMARY CARE PHYSICIAN: [facility]    ADMISSION DIAGNOSIS:  Hypoxia [R09.02]  DISCHARGE DIAGNOSIS:  Active Problems:   Acute respiratory failure with hypoxia ([place])   SECONDARY DIAGNOSIS:   Past Medical History:  Diagnosis Date  . Cancer [facility])    Larynx cancer.    Marland Kitchen COPD (chronic obstructive pulmonary disease) ([place])   . Heart attack 1995  . Hyperlipidemia   . Hypertension   . Renal artery stenosis [facility])     HOSPITAL COURSE:   82 year old male with a history of COPD who presents with acute respiratory failure with hypoxia  1. Acute respiratory failure with hypoxia: CT scan showed no evidence of PE, pneumonia or pulmonary edema. Patient does not have wheezing suggestive of COPD exacerbation. He had a similar hospitalization 3-4 years ago with hypoxia of unknown etiology. His oxygen has been weaned to 2 L. He will need oxygen discharge. At discharge he has no wheezing on examination. He was on steroids while in the hospital and will continue with prednisone 50 mg for 5 days then stop.  He was evaluated by the pulmonary team while in the hospital and will need to follow-up with the pulmonologist in 3 weeks. He will need outpatient PFTS..  2. COPD without exacerbation: Continue with inhalers  3. Accelerated essential hypertension: Continue atenolol, Norvasc, isosorbide and lisinopril.   4. Laryngeal cancer  5. Chronic kidney disease stage III   DISCHARGE CONDITIONS AND DIET:   Stable for discharge on regular diet  CONSULTS OBTAINED:  Treatment Team:  Laverle Hobby, MD  DRUG ALLERGIES:  No Known Allergies  DISCHARGE MEDICATIONS:   Current Discharge Medication List    START taking these medications    Details  mometasone-formoterol (DULERA) 100-5 MCG/ACT AERO Inhale 2 puffs into the lungs 2 (two) times daily. Qty: 0.1 g, Refills: 0    predniSONE (DELTASONE) 50 MG tablet Take 1 tablet (50 mg total) by mouth daily with breakfast. Qty: 5 tablet, Refills: 0      CONTINUE these medications which have NOT CHANGED   Details  amLODipine (NORVASC) 2.5 MG tablet Take 2.5 mg by mouth daily.    aspirin 325 MG tablet Take 325 mg by mouth daily.    atenolol (TENORMIN) 50 MG tablet Take 25 mg by mouth daily.    isosorbide mononitrate (IMDUR) 30 MG 24 hr tablet Take 30 mg by mouth daily.    lisinopril (PRINIVIL,ZESTRIL) 5 MG tablet Take 5 mg by mouth daily.    omeprazole (PRILOSEC) 20 MG capsule Take 20 mg by mouth daily.    simvastatin (ZOCOR) 20 MG tablet Take 20 mg by mouth daily.     tamsulosin (FLOMAX) 0.4 MG CAPS capsule Take 0.4 mg by mouth daily.    indomethacin (INDOCIN SR) 75 MG CR capsule Take 1 capsule (75 mg total) by mouth 2 (two) times daily with a meal. Qty: 60 capsule, Refills: 1              Today   CHIEF COMPLAINT:   Patient doing well this morning. He is ready to be discharged home   VITAL SIGNS:  Blood pressure (!) 181/75, pulse 62, temperature 97.9 F (36.6 C), temperature source Oral, resp. rate 18, height 5\' 11"  (1.803 m),  weight 91.9 kg (202 lb 9.6 oz), SpO2 98 %.   REVIEW OF SYSTEMS:  Review of Systems  Constitutional: Negative.  Negative for chills, fever and malaise/fatigue.  HENT: Negative.  Negative for ear discharge, ear pain, hearing loss, nosebleeds and sore throat.   Eyes: Negative.  Negative for blurred vision and pain.  Respiratory: Negative.  Negative for cough, hemoptysis, shortness of breath and wheezing.   Cardiovascular: Negative.  Negative for chest pain, palpitations and leg swelling.  Gastrointestinal: Negative.  Negative for abdominal pain, blood in stool, diarrhea, nausea and vomiting.  Genitourinary: Negative.  Negative  for dysuria.  Musculoskeletal: Negative.  Negative for back pain.  Skin: Negative.   Neurological: Negative for dizziness, tremors, speech change, focal weakness, seizures and headaches.  Endo/Heme/Allergies: Negative.  Does not bruise/bleed easily.  Psychiatric/Behavioral: Negative.  Negative for depression, hallucinations and suicidal ideas.     PHYSICAL EXAMINATION:  GENERAL:  82 y.o.-year-old patient lying in the bed with no acute distress.  NECK:  Supple, no jugular venous distention. No thyroid enlargement, no tenderness.  LUNGS: Normal breath sounds bilaterally, no wheezing, rales,rhonchi  No use of accessory muscles of respiration.  CARDIOVASCULAR: S1, S2 normal. No murmurs, rubs, or gallops.  ABDOMEN: Soft, non-tender, non-distended. Bowel sounds present. No organomegaly or mass.  EXTREMITIES: No pedal edema, cyanosis, or clubbing.  PSYCHIATRIC: The patient is alert and oriented x 3.  SKIN: No obvious rash, lesion, or ulcer.   DATA REVIEW:   CBC  Recent Labs Lab 09/17/16 0512  WBC 6.7  HGB 14.6  HCT 42.7  PLT 109*    Chemistries   Recent Labs Lab 09/16/16 1259 09/16/16 1608 09/17/16 0512  NA 138  --  136  K 4.2  --  4.4  CL 104  --  103  CO2 27  --  26  GLUCOSE 108*  --  139*  BUN 20  --  19  CREATININE 1.53*  --  1.34*  CALCIUM 8.6*  --  8.2*  MG  --  1.8  --   AST 26  --   --   ALT 15*  --   --   ALKPHOS 60  --   --   BILITOT 0.5  --   --     Cardiac Enzymes  Recent Labs Lab 09/16/16 1259  TROPONINI <0.03    Microbiology Results  @[email or web address] @  RADIOLOGY:  No results found.    Management plans discussed with the patient and he is in agreement. Stable for discharge home with [facility]  Patient should follow up with pcp  CODE STATUS:     Code Status Orders        Start     Ordered   09/16/16 1603  Full code  Continuous     09/16/16 1602    Code Status History    Date Active Date Inactive Code Status Order ID Comments User  Context   This patient has a current code status but no historical code status.    Advance Directive Documentation   Flowsheet Row Most Recent Value  Type of Advance Directive  Healthcare Power of Attorney  Pre-existing out of facility DNR order (yellow form or pink MOST form)  No data  "MOST" Form in Place?  No data      TOTAL TIME TAKING CARE OF THIS PATIENT: 36 minutes.    Note: This dictation was prepared with Dragon dictation along with smaller phrase technology. Any transcriptional errors that result from this process  are unintentional.  Miyani Cronic M.D on 09/19/2016 at 8:10 AM  Between 7am to 6pm - Pager - [phone] After 6pm go to [email or web address] - password [email or web address] [place] [facility]  Office  [phone]  CC: Primary care physician; [facility]

## 2016-09-19 NOTE — Telephone Encounter (Signed)
[place] [address] calling asking if we have any alternative for the inhaler the hospital called in  It is going to cost the patient about $400, for this is not covered at all Please advise

## 2016-09-23 MED ORDER — FLUTICASONE FUROATE-VILANTEROL 100-25 MCG/INH IN AEPB: 1.0000 | INHALATION_SPRAY | Freq: Every day | RESPIRATORY_TRACT | 5 refills | Status: AC

## 2016-09-23 NOTE — Telephone Encounter (Signed)
Flovent is not equivalent to [facility] Equivalent options include Breo, Advair, Symbicort. If none of these are options, Anoro, Bevespi, Stiolto are probably reasonable options  Louis Harrison

## 2016-09-23 NOTE — Telephone Encounter (Signed)
Spoke with [place] with [employer] [facility], kent states breo 100 will be covered. I have sent Rx to pharmacy. Nothing further needed.  Will route to DS for a fyi.

## 2016-09-27 ENCOUNTER — Ambulatory Visit
Admission: RE | Admit: 2016-09-27 | Discharge: 2016-09-27 | Disposition: A | Discharge status: Home / Self Care | Payer: Medicare Other | Source: Ambulatory Visit | Attending: Pulmonary Disease | Admitting: Pulmonary Disease

## 2016-09-27 ENCOUNTER — Encounter: Payer: Self-pay | Admitting: Pulmonary Disease

## 2016-09-27 ENCOUNTER — Ambulatory Visit (INDEPENDENT_AMBULATORY_CARE_PROVIDER_SITE_OTHER): Payer: Medicare Other | Admitting: Pulmonary Disease

## 2016-09-27 VITALS — BP 122/62 | HR 56 | Ht 71.0 in | Wt 198.2 lb

## 2016-09-27 DIAGNOSIS — R0902 Hypoxemia: Secondary | ICD-10-CM | POA: Diagnosis not present

## 2016-09-27 DIAGNOSIS — I517 Cardiomegaly: Secondary | ICD-10-CM | POA: Diagnosis not present

## 2016-09-27 DIAGNOSIS — J42 Unspecified chronic bronchitis: Secondary | ICD-10-CM

## 2016-09-27 DIAGNOSIS — R609 Edema, unspecified: Secondary | ICD-10-CM | POA: Diagnosis not present

## 2016-09-27 DIAGNOSIS — Z87891 Personal history of nicotine dependence: Secondary | ICD-10-CM

## 2016-09-27 NOTE — Patient Instructions (Signed)
1) Continue Breo inhaler 2) You may go without oxygen during the day.  3) Continue to wear oxygen at night with sleep 4) We will obtain an overnight oxygen test to see if you still need oxygen at night 5) Chest Xray ordered for today
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
## 2016-10-04 ENCOUNTER — Telehealth: Payer: Self-pay | Admitting: Pulmonary Disease

## 2016-10-04 NOTE — Telephone Encounter (Signed)
Lmtcb. Will await call back 

## 2016-10-04 NOTE — Telephone Encounter (Signed)
Pt returned call but due to meeting all calls were forwarded to [facility]--pls call [phone]

## 2016-10-04 NOTE — Telephone Encounter (Signed)
Please call after 1:30.   Xray results please. Haven't heard from the oxygen people? Please call patient's wife Pamala Hurry.

## 2016-10-05 NOTE — Telephone Encounter (Signed)
pts wife called and she stated that they had not heard anything about the pts cxr.  Pt is wanting to get these results.  DS please advise. Thanks  No Known Allergies

## 2016-10-05 NOTE — Telephone Encounter (Signed)
Lmtcb. Will await call

## 2016-10-05 NOTE — Telephone Encounter (Signed)
Let them know that the CXR looks good. No worrisome findings  Louis Harrison

## 2016-10-05 NOTE — Telephone Encounter (Signed)
Patient is returning phone call.  °

## 2016-10-06 NOTE — Telephone Encounter (Signed)
Pt spouse aware of results & voiced understanding. Nothing further needed.

## 2016-10-12 ENCOUNTER — Telehealth: Payer: Self-pay | Admitting: Pulmonary Disease

## 2016-10-12 NOTE — Telephone Encounter (Signed)
Patient spouse wants sleep study results to see if he still needs o2 .

## 2016-10-12 NOTE — Telephone Encounter (Signed)
Spoke with Pamala Hurry (pt's wife), stated that we have not received the study results yet. Will call back once we have the results.

## 2016-10-27 ENCOUNTER — Telehealth: Payer: Self-pay | Admitting: Pulmonary Disease

## 2016-10-27 NOTE — Telephone Encounter (Signed)
atc pt X3, line rang to fast busy signal.  Will route to [place] triage pool for follow-up.

## 2016-10-27 NOTE — Telephone Encounter (Signed)
Pt wife would like sleep study results. Please call.

## 2016-10-28 ENCOUNTER — Ambulatory Visit (INDEPENDENT_AMBULATORY_CARE_PROVIDER_SITE_OTHER): Payer: Medicare Other | Admitting: Pulmonary Disease

## 2016-10-28 ENCOUNTER — Ambulatory Visit: Payer: Medicare Other | Admitting: Pulmonary Disease

## 2016-10-28 ENCOUNTER — Encounter: Payer: Self-pay | Admitting: Pulmonary Disease

## 2016-10-28 VITALS — BP 138/88 | HR 55 | Wt 201.0 lb

## 2016-10-28 DIAGNOSIS — J42 Unspecified chronic bronchitis: Secondary | ICD-10-CM

## 2016-10-28 DIAGNOSIS — J439 Emphysema, unspecified: Secondary | ICD-10-CM | POA: Diagnosis not present

## 2016-10-28 DIAGNOSIS — Z87891 Personal history of nicotine dependence: Secondary | ICD-10-CM | POA: Diagnosis not present

## 2016-10-28 DIAGNOSIS — G4734 Idiopathic sleep related nonobstructive alveolar hypoventilation: Secondary | ICD-10-CM

## 2016-10-28 NOTE — Telephone Encounter (Signed)
Pt didn't have a sleep study. He had an ONO. Contacted [facility] for results and pt has an appt today with DS and will receive results at that time.

## 2016-10-28 NOTE — Patient Instructions (Addendum)
We will call [place] to have them remove the oxygen tanks  I want you to continue on oxygen during night, with sleep  Continue Breo inhaler - I have refilled prescription for this  Follow up in 3-4 months

## 2016-11-01 ENCOUNTER — Ambulatory Visit: Payer: Medicare Other | Admitting: Internal Medicine
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
## 2017-01-04 ENCOUNTER — Other Ambulatory Visit: Payer: Self-pay | Admitting: Otolaryngology

## 2017-01-04 DIAGNOSIS — R221 Localized swelling, mass and lump, neck: Secondary | ICD-10-CM

## 2017-01-11 ENCOUNTER — Ambulatory Visit
Admission: RE | Admit: 2017-01-11 | Discharge: 2017-01-11 | Disposition: A | Discharge status: Home / Self Care | Payer: Medicare Other | Source: Ambulatory Visit | Attending: Otolaryngology | Admitting: Otolaryngology

## 2017-01-11 DIAGNOSIS — R221 Localized swelling, mass and lump, neck: Secondary | ICD-10-CM

## 2017-01-11 LAB — POCT I-STAT CREATININE: CREATININE: 1.7 mg/dL — AB (ref 0.61–1.24)

## 2017-01-11 MED ORDER — IOPAMIDOL (ISOVUE-300) INJECTION 61%
60.0000 mL | Freq: Once | INTRAVENOUS | Status: AC | PRN
  Administered 2017-01-11: 60 mL via INTRAVENOUS

## 2017-01-11 MED ORDER — IOPAMIDOL (ISOVUE-300) INJECTION 61%: 75.0000 mL | Freq: Once | INTRAVENOUS | Status: DC | PRN

## 2017-02-06 ENCOUNTER — Telehealth: Payer: Self-pay | Admitting: Pulmonary Disease

## 2017-02-06 NOTE — Telephone Encounter (Signed)
Pt c/o Shortness Of Breath: STAT if SOB developed within the last 24 hours or pt is noticeably SOB on the phone  1. Are you currently SOB (can you hear that pt is SOB on the phone)? No per wife   2. How long have you been experiencing SOB? Noticed increase in last couple of weeks   3. Are you SOB when sitting or when up moving around? On exertion   4. Are you currently experiencing any other symptoms? Productive cough sort of clear   Wants sooner than 5/25 next available appt.

## 2017-02-07 NOTE — Telephone Encounter (Signed)
Per wife patient o2 sats 80's she wants to know if he can be seen asap.  Please call.

## 2017-02-07 NOTE — Telephone Encounter (Signed)
Spoke with wife and informed her per DS to place pt on O2 and to go to the ER. Pt states she will place pt on O2 and asked for an acute visit. Pt's wife stating she doesn't feel it is bad enough to go to the ER but that if he gets worse she will take him to the ER. appt given for Thursday and re-advised wife to take pt to ER per DS. Nothing further needed at this time.

## 2017-02-09 ENCOUNTER — Ambulatory Visit (INDEPENDENT_AMBULATORY_CARE_PROVIDER_SITE_OTHER): Payer: Medicare Other | Admitting: Internal Medicine

## 2017-02-09 ENCOUNTER — Encounter: Payer: Self-pay | Admitting: Internal Medicine

## 2017-02-09 VITALS — BP 128/70 | HR 63 | Wt 207.0 lb

## 2017-02-09 DIAGNOSIS — J449 Chronic obstructive pulmonary disease, unspecified: Secondary | ICD-10-CM

## 2017-02-09 DIAGNOSIS — J441 Chronic obstructive pulmonary disease with (acute) exacerbation: Secondary | ICD-10-CM

## 2017-02-09 MED ORDER — PREDNISONE 20 MG PO TABS: 20.0000 mg | ORAL_TABLET | Freq: Every day | ORAL | 0 refills | Status: DC

## 2017-02-09 MED ORDER — AMOXICILLIN-POT CLAVULANATE 875-125 MG PO TABS: 1.0000 | ORAL_TABLET | Freq: Two times a day (BID) | ORAL | 0 refills | Status: DC

## 2017-02-09 NOTE — Patient Instructions (Signed)
Prednisone 40 mg daily for 10 days Augmentin 875 mg twice daily for 7 days Oxygen as needed

## 2017-02-09 NOTE — Progress Notes (Signed)
PULMONARY OFFICE FOLLOW UP  PROBLEMS: Former smoker (30-40 PY hx, quit 1994) Chronic bronchitis Nocturnal hypoxemia H/O laryngeal cancer H/O MI  DATA: CTA 09/16/16: Mild emphysema, bibasilar atelectasis ONO 10/05/16: Frequent desats < 90%. Lowest SpO2 75%   SUBJ: +cough and chest congestion with wheezing Increased SOB and DOE +chest congestion No fevers  OBJ: Vitals:   02/09/17 1133  BP: 128/70  Pulse: 63  SpO2: 91%  Weight: 207 lb (93.9 kg)  RA    Review of Systems:  Gen:  Denies  fever, sweats, chills weigh loss   HEENT: Denies blurred vision, double vision, ear pain, eye pain, hearing loss, nose bleeds, sore throat  Cardiac:  No dizziness, chest pain or heaviness, chest tightness,edema  Resp:  + cough +sputum porduction,+ shortness of breath,+wheezing, -hemoptysis,   Other:  All other systems negative     NAD HEENT normal No JVD noted, no LAN +rhonchi that clear with cough, +wheezes Reg, no M NABS, soft No C/C/E. Severe gouty arthritic changes   IMPRESSION: 83 yo white male with chronic resp failure nocturnal hypoxia with previous h/o larygneal cancer with mild COPD exacerbation  PLAN: 1) Continue Breo inhaler daily  2) Continue nocturnal O2 for now 3. ambulating pulse oximetry-reveals hypoxia and will need oxygen with ambulation 4) start prednisone 40 mg daily for 10 days, augmentin 875 mg for 7 days  Follow up with Dr Alva Garnet in 2 weeks   Patient/Family are satisfied with Plan of action and management. All questions answered  Corrin Parker, M.D.  [facility]  Medical Director [place] Director [facility] Cardio-Pulmonary Department

## 2017-02-09 NOTE — Addendum Note (Signed)
Addended by: Oscar La R on: 02/09/2017 11:52 AM   Modules accepted: Orders

## 2017-03-06 ENCOUNTER — Ambulatory Visit: Payer: Medicare Other | Admitting: Pulmonary Disease

## 2017-03-06 ENCOUNTER — Encounter: Payer: Self-pay | Admitting: Pulmonary Disease

## 2017-03-06 ENCOUNTER — Ambulatory Visit (INDEPENDENT_AMBULATORY_CARE_PROVIDER_SITE_OTHER): Payer: Medicare Other | Admitting: Pulmonary Disease

## 2017-03-06 VITALS — BP 110/62 | HR 63 | Resp 16 | Ht 71.0 in | Wt 196.0 lb

## 2017-03-06 DIAGNOSIS — Z87891 Personal history of nicotine dependence: Secondary | ICD-10-CM | POA: Diagnosis not present

## 2017-03-06 DIAGNOSIS — G4734 Idiopathic sleep related nonobstructive alveolar hypoventilation: Secondary | ICD-10-CM | POA: Diagnosis not present

## 2017-03-06 DIAGNOSIS — J432 Centrilobular emphysema: Secondary | ICD-10-CM | POA: Diagnosis not present

## 2017-03-06 DIAGNOSIS — J42 Unspecified chronic bronchitis: Secondary | ICD-10-CM

## 2017-03-06 DIAGNOSIS — R0902 Hypoxemia: Secondary | ICD-10-CM

## 2017-03-06 NOTE — Progress Notes (Signed)
PULMONARY OFFICE FOLLOW UP  PROBLEMS: Former smoker (30-40 PY hx, quit 1994) Chronic bronchitis Nocturnal hypoxemia H/O laryngeal cancer H/O MI  DATA: CTA 09/16/16: Mild emphysema, bibasilar atelectasis ONO 10/05/16: Frequent desats < 90%. Lowest SpO2 75%  INTERVAL HISTORY: Saw DK 02/09/17 for mild COPD exacerbation. Treated with prednisone and Augmentin.  SUBJ: His respiratory symptoms have returned back to baseline. He is wearing his oxygen with sleep and wearing it more often during the daytime. His wife checks his SPO2 with a home pulse oximeter. It will sometimes drop into the 80s which prompts him to wear his oxygen. Overall is wearing oxygen a few to several hours per day. He has no new complaints.Denies CP, fever, purulent sputum, hemoptysis, LE edema and calf tenderness.  OBJ: Vitals:   03/06/17 0936 03/06/17 0941  BP:  110/62  Pulse:  63  Resp: 16   SpO2:  92%  Weight: 196 lb (88.9 kg)   Height: 5\' 11"  (1.803 m)   RA  NAD HEENT normal No JVD noted, no LAN Scattered rhonchi that clear with cough, no wheezes Reg, no M NABS, soft No C/C/E. Arthritic changes, unchanged  DATA: CXR: NNF  IMPRESSION: 1) Former smoker 2) Mild emphysema 3) Chronic bronchitis 4) Nocturnal hypoxemia  5) exercise desaturation  PLAN: 1) Continue Breo inhaler daily  2) Continue oxygen therapy with sleep and exertion. We will try to obtain a portable oxygen concentrator through [facility] 3) Follow up 4 months  Merton Border, MD PCCM service Mobile [phone] Pager [phone] 03/06/2017. 10:21 AM

## 2017-03-06 NOTE — Patient Instructions (Signed)
Continue Breo inhaler Continue oxygen with sleep and exertion We will make efforts to obtain a portable oxygen concentrator Follow-up in 4 months or sooner as needed

## 2017-03-10 IMAGING — CR DG CHEST 2V
1 series · 2 of 2 positions shown · non-contrast
Comparison: September 16, 2016

CLINICAL DATA: Cough and shortness of breath

EXAM:
CHEST  2 VIEW

[Series 1: dg chest 2 view · 0.14mm/px · 2 of 2 slices shown]
[im 1/2]
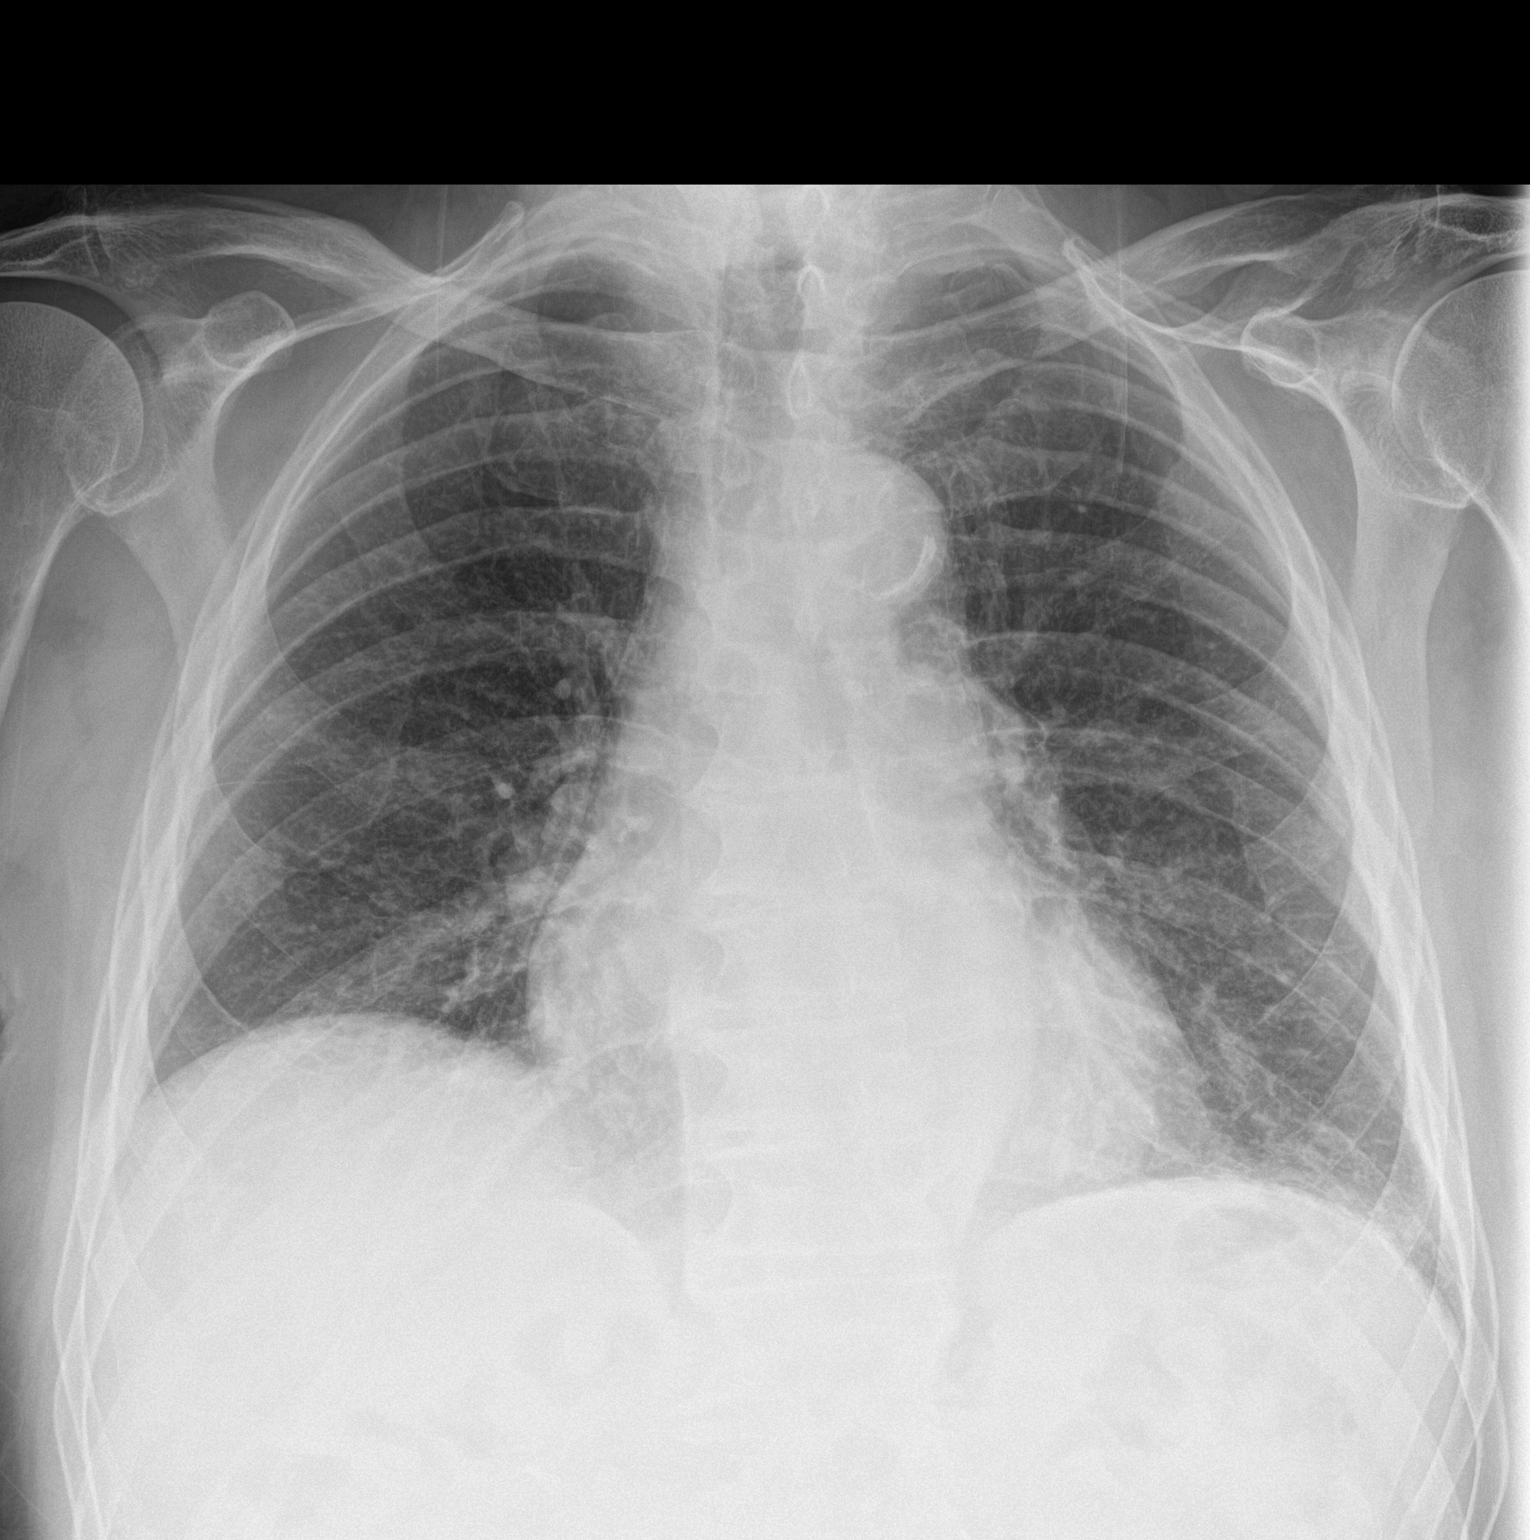
[im 2/2]
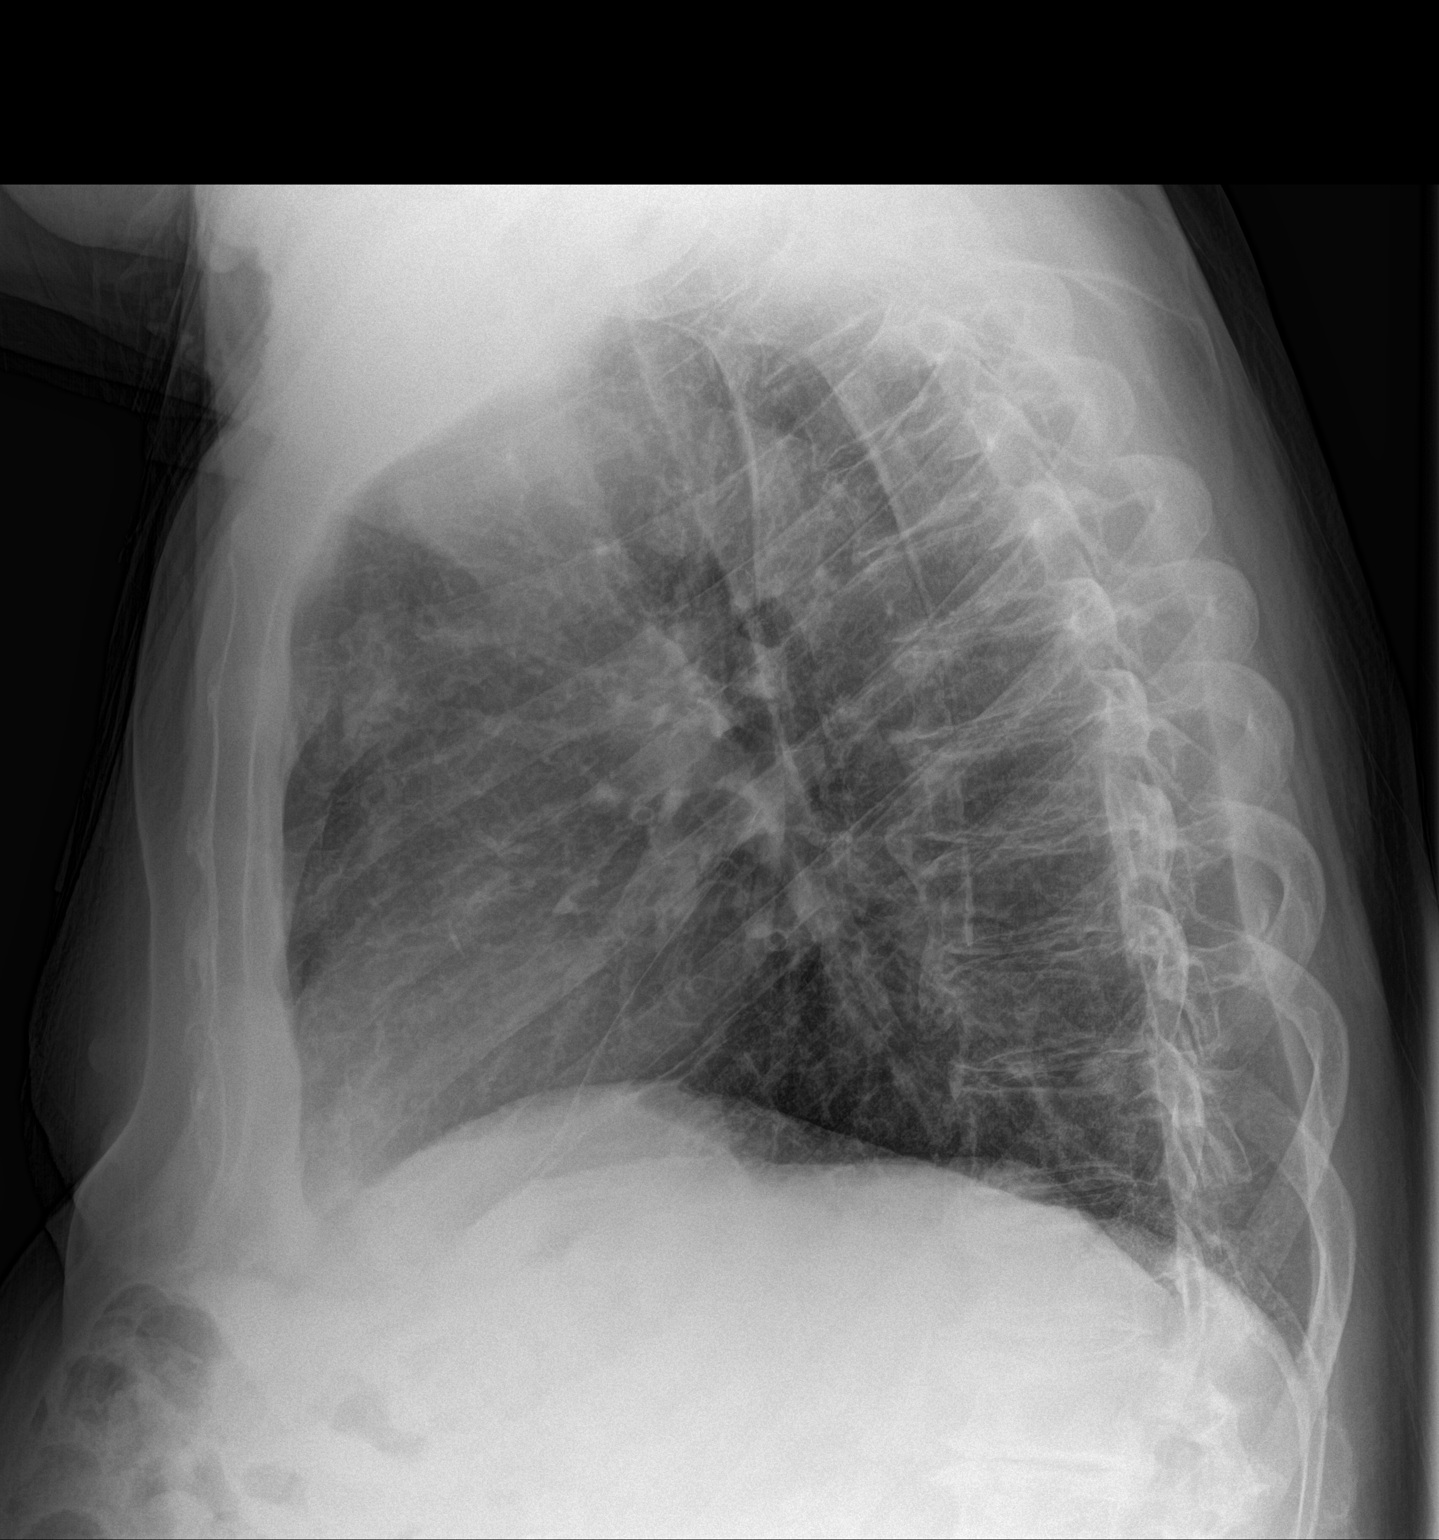

[2 of 2 positions shown; findings below may reference images not displayed]

FINDINGS: The heart size is enlarged. The aorta is tortuous. There is mild
interstitial edema. There is no focal pneumonia or pleural effusion.
Degenerative joint changes of the spine are noted.
IMPRESSION: Cardiomegaly and mild interstitial edema.

## 2017-04-05 DIAGNOSIS — I2510 Atherosclerotic heart disease of native coronary artery without angina pectoris: Secondary | ICD-10-CM | POA: Insufficient documentation

## 2017-05-08 ENCOUNTER — Encounter: Payer: Self-pay | Admitting: Radiation Oncology

## 2017-05-08 ENCOUNTER — Ambulatory Visit
Admission: RE | Admit: 2017-05-08 | Discharge: 2017-05-08 | Disposition: A | Discharge status: Home / Self Care | Payer: Medicare Other | Source: Ambulatory Visit | Attending: Radiation Oncology | Admitting: Radiation Oncology

## 2017-05-08 VITALS — BP 152/84 | HR 66 | Temp 98.0°F | Wt 202.5 lb

## 2017-05-08 DIAGNOSIS — Z87891 Personal history of nicotine dependence: Secondary | ICD-10-CM | POA: Insufficient documentation

## 2017-05-08 DIAGNOSIS — C329 Malignant neoplasm of larynx, unspecified: Secondary | ICD-10-CM

## 2017-05-08 DIAGNOSIS — Z9981 Dependence on supplemental oxygen: Secondary | ICD-10-CM | POA: Diagnosis not present

## 2017-05-08 DIAGNOSIS — Z8521 Personal history of malignant neoplasm of larynx: Secondary | ICD-10-CM | POA: Insufficient documentation

## 2017-05-08 DIAGNOSIS — J449 Chronic obstructive pulmonary disease, unspecified: Secondary | ICD-10-CM | POA: Diagnosis not present

## 2017-05-08 DIAGNOSIS — R05 Cough: Secondary | ICD-10-CM | POA: Diagnosis not present

## 2017-05-08 DIAGNOSIS — Z923 Personal history of irradiation: Secondary | ICD-10-CM | POA: Diagnosis not present

## 2017-05-08 NOTE — Progress Notes (Signed)
Radiation Oncology Follow up Note  Name: Louis Harrison   Date:   05/08/2017 MRN:  [id number] DOB: Jan 18, 1928    This 83 y.o. 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  REFERRING PROVIDER: [facility]  HPI: Patient is an 83 year old male now out to a half years having completed radiation therapy to his larynx for a T1 basal type squamous cell carcinoma. Seen today in routine follow-up he is doing fairly well. Still has what the wife described as a yellowish productive cough. He is recently been put on nasal oxygen. He continues to have close follow-up care with ENT who reports normal examination. Recent CT scan shows some asymmetry in the larynx compatible with persistent edema no evidence of mass or nodularity is noted.Marland Kitchen He is being followed by pulmonologist for COPD. Patient does have CT evidence of probable asbestos exposure in the past  COMPLICATIONS OF TREATMENT: none  FOLLOW UP COMPLIANCE: keeps appointments   PHYSICAL EXAM:  BP (!) 152/84 (BP Location: Right Arm, Patient Position: Sitting, Cuff Size: Large)   Pulse 66   Temp 98 F (36.7 C) (Tympanic)   Wt 202 lb 7.9 oz (91.8 kg)   BMI 28.24 kg/m  Oral cavity is clear no oral mucosal lesions are identified. Indirect mirror examination shows vallecula and base of tongue within normal limits there still some edema present in the larynx all cords are mobile. Neck is clear without evidence of subject gastric cervical or supraclavicular adenopathy. Well-developed well-nourished patient in NAD. HEENT reveals PERLA, EOMI, discs not visualized.  Oral cavity is clear. No oral mucosal lesions are identified. Neck is clear without evidence of cervical or supraclavicular adenopathy. Lungs are clear to A&P. Cardiac examination is essentially unremarkable with regular rate and rhythm without murmur rub or thrill. Abdomen is benign with  no organomegaly or masses noted. Motor sensory and DTR levels are equal and symmetric in the upper and lower extremities. Cranial nerves II through XII are grossly intact. Proprioception is intact. No peripheral adenopathy or edema is identified. No motor or sensory levels are noted. Crude visual fields are within normal range.  RADIOLOGY RESULTS: CT scan is reviewed and compatible with the above-stated findings.  PLAN: Present time patient is doing well. Some of his pulmonary symptoms I am sure is related more towards his COPD then prior small field radiation to his larynx. He's also recently put on thyroid medication which is helped some of his energy level. He continues close follow-up care with both ENT and pulmonary. I have asked to see him back in 1 year for follow-up. Patient wife know to call with any concerns.  I would like to take this opportunity to thank you for allowing me to participate in the care of your patient.Armstead Peaks., MD

## 2017-05-08 NOTE — Progress Notes (Signed)
Patient here for follow up. He recently had thyroid medication added to his med list.

## 2017-07-17 ENCOUNTER — Encounter: Payer: Self-pay | Admitting: Pulmonary Disease

## 2017-07-17 ENCOUNTER — Ambulatory Visit (INDEPENDENT_AMBULATORY_CARE_PROVIDER_SITE_OTHER): Payer: Medicare Other | Admitting: Pulmonary Disease

## 2017-07-17 VITALS — BP 170/80 | HR 52 | Resp 16 | Ht 71.0 in | Wt 197.0 lb

## 2017-07-17 DIAGNOSIS — J42 Unspecified chronic bronchitis: Secondary | ICD-10-CM

## 2017-07-17 DIAGNOSIS — J9611 Chronic respiratory failure with hypoxia: Secondary | ICD-10-CM

## 2017-07-17 DIAGNOSIS — Z87891 Personal history of nicotine dependence: Secondary | ICD-10-CM

## 2017-07-17 DIAGNOSIS — J209 Acute bronchitis, unspecified: Secondary | ICD-10-CM

## 2017-07-17 MED ORDER — AMOXICILLIN-POT CLAVULANATE 875-125 MG PO TABS: 1.0000 | ORAL_TABLET | Freq: Two times a day (BID) | ORAL | 0 refills | Status: AC

## 2017-07-17 NOTE — Patient Instructions (Signed)
Augmentin twice a day for 7 days  Continue the rest of your medications as is  We will contact [facility] to try to obtain a portable oxygen concentrator  Follow-up in 4 months or sooner as needed

## 2017-07-18 NOTE — Progress Notes (Signed)
PULMONARY OFFICE FOLLOW UP  PROBLEMS: Former smoker (30-40 PY hx, quit 1994) Chronic bronchitis Nocturnal hypoxemia H/O laryngeal cancer H/O MI  DATA: CTA 09/16/16: Mild emphysema, bibasilar atelectasis ONO 10/05/16: Frequent desats < 90%. Lowest SpO2 75%  INTERVAL HISTORY: Saw DK 02/09/17 for mild COPD exacerbation. Treated with prednisone and Augmentin.  SUBJ: This is a routine re-eval. He voices no new complaints. However, his wife reports that he has a "real problem" with cough productive of yellow mucus. This has increased recently. In the past, Augmentin has worked well to reduce these symptoms. He is wearing oxygen with sleep and wearing it more often during the daytime several hours per day.Denies CP, fever, hemoptysis, LE edema and calf tenderness.  OBJ: Vitals:   07/17/17 1115 07/17/17 1123  BP:  (!) 170/80  Pulse:  (!) 52  Resp: 16   SpO2:  94%  Weight: 89.4 kg (197 lb)   Height: 5\' 11"  (1.803 m)   RA  NAD, hoarse, raspy voice quality HEENT normal No JVD noted, no LAN Scattered rhonchi, no wheezes Reg, no M NABS, soft No C/C/E. Arthritic changes  DATA: CXR: NNF  IMPRESSION: 1) Former smoker 2) Mild emphysema 3) Chronic bronchitis with acute exacerbation 4) Nocturnal hypoxemia  5) exercise desaturation  PLAN: 1) Continue Breo inhaler daily  2) Continue oxygen therapy with sleep and exertion.  3) We will again try to obtain a portable oxygen concentrator through [facility] 4) Augmentin 875 mg BID X 7 days 5) follow up in 4 months or PRN  Merton Border, MD PCCM service Mobile [phone] Pager [phone] 07/18/2017. 8:46 PM

## 2017-11-14 ENCOUNTER — Ambulatory Visit (INDEPENDENT_AMBULATORY_CARE_PROVIDER_SITE_OTHER): Payer: Medicare Other | Admitting: Pulmonary Disease

## 2017-11-14 ENCOUNTER — Ambulatory Visit
Admission: RE | Admit: 2017-11-14 | Discharge: 2017-11-14 | Disposition: A | Discharge status: Home / Self Care | Payer: Medicare Other | Source: Ambulatory Visit | Attending: Pulmonary Disease | Admitting: Pulmonary Disease

## 2017-11-14 ENCOUNTER — Encounter: Payer: Self-pay | Admitting: Pulmonary Disease

## 2017-11-14 VITALS — BP 102/80 | HR 60 | Ht 71.0 in | Wt 193.0 lb

## 2017-11-14 DIAGNOSIS — R911 Solitary pulmonary nodule: Secondary | ICD-10-CM

## 2017-11-14 DIAGNOSIS — R634 Abnormal weight loss: Secondary | ICD-10-CM | POA: Insufficient documentation

## 2017-11-14 DIAGNOSIS — J9611 Chronic respiratory failure with hypoxia: Secondary | ICD-10-CM

## 2017-11-14 DIAGNOSIS — J411 Mucopurulent chronic bronchitis: Secondary | ICD-10-CM | POA: Diagnosis present

## 2017-11-14 DIAGNOSIS — J449 Chronic obstructive pulmonary disease, unspecified: Secondary | ICD-10-CM | POA: Diagnosis present

## 2017-11-14 DIAGNOSIS — Z87891 Personal history of nicotine dependence: Secondary | ICD-10-CM | POA: Diagnosis present

## 2017-11-14 DIAGNOSIS — J439 Emphysema, unspecified: Secondary | ICD-10-CM | POA: Diagnosis not present

## 2017-11-14 DIAGNOSIS — R918 Other nonspecific abnormal finding of lung field: Secondary | ICD-10-CM | POA: Diagnosis not present

## 2017-11-14 MED ORDER — AMOXICILLIN-POT CLAVULANATE 875-125 MG PO TABS: 1.0000 | ORAL_TABLET | Freq: Two times a day (BID) | ORAL | 0 refills | Status: AC

## 2017-11-14 NOTE — Progress Notes (Signed)
PULMONARY OFFICE FOLLOW UP  PROBLEMS: Former smoker (30-40 PY hx, quit 1994) Chronic bronchitis Mild emphysema Nocturnal and exertional hypoxemia H/O laryngeal cancer H/O MI  DATA: Office spirometry 08/06/13: No obstruction CTA 09/16/16: Mild emphysema, bibasilar atelectasis ONO 10/05/16: Frequent desats < 90%. Lowest SpO2 75%  INTERVAL HISTORY: Saw DK 02/09/17 for mild COPD exacerbation. Treated with prednisone and Augmentin.  SUBJ: This is a routine re-eval.  Last visit, he was treated with Augmentin for chronic purulent bronchitis with exacerbation.  His symptoms improved for approximately a month.  His wife again expresses concern regarding chronic cough and mucus production which she describes as thick and brown.  His respiratory symptoms are otherwise stable to improved.  His wife also expresses concern about unexplained weight loss. Denies CP, fever, hemoptysis, LE edema and calf tenderness.  OBJ: Vitals:   11/14/17 1132 11/14/17 1136  BP:  102/80  Pulse:  60  Weight: 87.5 kg (193 lb)   Height: 5\' 11"  (1.803 m)    2 LPM [place]  NAD,  HEENT normal No JVD noted, no LAN Mildly diminished breath sounds, no wheezes, 8 RLL crackles Reg, no M NABS, soft No C/C/E.  Extensive gouty arthritis changes  DATA: CXR: No recent film  IMPRESSION: 1) Former smoker 2) Mild emphysema 3) Chronic purulent bronchitis with acute exacerbation 4) Nocturnal and exertional hypoxemia  5) unexplained weight loss  PLAN: 1) Continue Breo inhaler daily  2) Continue oxygen therapy with sleep and exertion.  3) repeat Augmentin 875 mg BID X 7 days 4) CXR ordered to be done this week 5) follow-up in 3-4 months  Merton Border, MD PCCM service Mobile [phone] Pager [phone] 11/14/2017. 11:59 AM

## 2017-11-14 NOTE — Patient Instructions (Signed)
Continue Brio inhaler Continue oxygen therapy with sleep and exertion Augmentin 875 mg twice a day for 7 days Chest x-ray ordered -please get this sometime this week Follow-up in 3-4 months

## 2017-11-30 ENCOUNTER — Telehealth: Payer: Self-pay | Admitting: Pulmonary Disease

## 2017-11-30 NOTE — Telephone Encounter (Signed)
Pt wife would like chest xray results.

## 2017-12-04 NOTE — Telephone Encounter (Signed)
Called and spoke with family to give results.  It appears that this chest x-ray was done due to weight loss.  It showed a small shadow in the left apex which could represent a nodule.  We will therefore order a CAT scan to better delineate this, the patient has a history of kidney disease in the past, therefore will order a noncontrast CT of the chest.

## 2017-12-04 NOTE — Telephone Encounter (Signed)
Please advise on pt's CXR since DS is out of office this week. Thanks

## 2017-12-04 NOTE — Telephone Encounter (Signed)
Patient wife awaiting response please call

## 2017-12-04 NOTE — Telephone Encounter (Signed)
Nothing further needed 

## 2017-12-04 NOTE — Addendum Note (Signed)
Addended by: Laverle Hobby on: 12/04/2017 03:59 PM   Modules accepted: Orders

## 2017-12-20 ENCOUNTER — Ambulatory Visit
Admission: RE | Admit: 2017-12-20 | Discharge: 2017-12-20 | Disposition: A | Discharge status: Home / Self Care | Payer: Medicare Other | Source: Ambulatory Visit | Attending: Internal Medicine | Admitting: Internal Medicine

## 2017-12-20 DIAGNOSIS — M4850XA Collapsed vertebra, not elsewhere classified, site unspecified, initial encounter for fracture: Secondary | ICD-10-CM | POA: Insufficient documentation

## 2017-12-20 DIAGNOSIS — I700 Atherosclerosis of aorta: Secondary | ICD-10-CM | POA: Insufficient documentation

## 2017-12-20 DIAGNOSIS — I252 Old myocardial infarction: Secondary | ICD-10-CM | POA: Diagnosis not present

## 2017-12-20 DIAGNOSIS — J61 Pneumoconiosis due to asbestos and other mineral fibers: Secondary | ICD-10-CM | POA: Insufficient documentation

## 2017-12-20 DIAGNOSIS — I2510 Atherosclerotic heart disease of native coronary artery without angina pectoris: Secondary | ICD-10-CM | POA: Diagnosis not present

## 2017-12-20 DIAGNOSIS — J432 Centrilobular emphysema: Secondary | ICD-10-CM | POA: Diagnosis not present

## 2017-12-20 DIAGNOSIS — R911 Solitary pulmonary nodule: Secondary | ICD-10-CM | POA: Insufficient documentation

## 2017-12-21 ENCOUNTER — Telehealth: Payer: Self-pay | Admitting: *Deleted

## 2017-12-21 NOTE — Telephone Encounter (Signed)
-----   Message from Wilhelmina Mcardle, MD sent at 12/21/2017  2:24 PM EST ----- Let him know that CT chest did not reveal any lung nodule and there are no findings on the CT chest that are worrisome or require further evaluation or follow up  Waunita Schooner

## 2017-12-21 NOTE — Telephone Encounter (Signed)
Patient calling for ct results. °

## 2017-12-21 NOTE — Telephone Encounter (Signed)
Wife and pt informed of CT results. Nothing further needed.

## 2017-12-29 ENCOUNTER — Telehealth: Payer: Self-pay | Admitting: Pulmonary Disease

## 2017-12-29 MED ORDER — AMOXICILLIN-POT CLAVULANATE 875-125 MG PO TABS: 1.0000 | ORAL_TABLET | Freq: Two times a day (BID) | ORAL | 0 refills | Status: AC

## 2017-12-29 NOTE — Telephone Encounter (Signed)
Left message that abx has been sent as requested. Nothing further needed.

## 2017-12-29 NOTE — Telephone Encounter (Signed)
Yes, that's ok.

## 2017-12-29 NOTE — Telephone Encounter (Signed)
Patient wife calling Would like to know if Dr. Alva Garnet can prescribe amox-clav  Patient is having a lot of problems with mucus and it was the only thing to help before If so, please send refill to [facility]

## 2018-01-23 ENCOUNTER — Telehealth: Payer: Self-pay | Admitting: Pulmonary Disease

## 2018-01-23 NOTE — Telephone Encounter (Signed)
°*  STAT* If patient is at the pharmacy, call can be transferred to refill team.   1. Which medications need to be refilled? (please list name of each medication and dose if known) amox/clav   2. Which pharmacy/location (including street and city if local pharmacy) is medication to be sent to? [facility]   3. Do they need a 30 day or 90 day supply? [place]

## 2018-01-23 NOTE — Telephone Encounter (Signed)
Pt is coughing up a lot of brown/greenish mucus. They are requesting another refill on Augmentin. They have tried Mucinex without any success. Please advise.

## 2018-01-24 MED ORDER — AMOXICILLIN-POT CLAVULANATE 875-125 MG PO TABS: 1.0000 | ORAL_TABLET | Freq: Two times a day (BID) | ORAL | 0 refills | Status: DC

## 2018-01-24 NOTE — Telephone Encounter (Signed)
Augmentin order placed  Waunita Schooner

## 2018-01-24 NOTE — Telephone Encounter (Signed)
Spouse notified rx sent.

## 2018-03-12 ENCOUNTER — Ambulatory Visit (INDEPENDENT_AMBULATORY_CARE_PROVIDER_SITE_OTHER): Payer: Medicare Other | Admitting: Pulmonary Disease

## 2018-03-12 ENCOUNTER — Encounter: Payer: Self-pay | Admitting: Pulmonary Disease

## 2018-03-12 VITALS — BP 128/72 | HR 55 | Resp 16 | Ht 71.0 in | Wt 191.0 lb

## 2018-03-12 DIAGNOSIS — R0902 Hypoxemia: Secondary | ICD-10-CM

## 2018-03-12 DIAGNOSIS — J449 Chronic obstructive pulmonary disease, unspecified: Secondary | ICD-10-CM

## 2018-03-12 DIAGNOSIS — J42 Unspecified chronic bronchitis: Secondary | ICD-10-CM

## 2018-03-12 DIAGNOSIS — G4734 Idiopathic sleep related nonobstructive alveolar hypoventilation: Secondary | ICD-10-CM

## 2018-03-12 DIAGNOSIS — J920 Pleural plaque with presence of asbestos: Secondary | ICD-10-CM | POA: Diagnosis not present

## 2018-03-12 NOTE — Patient Instructions (Signed)
Continue oxygen therapy with sleep and exertion Follow-up in 1 year with chest x-ray

## 2018-03-12 NOTE — Progress Notes (Signed)
PULMONARY OFFICE FOLLOW UP  PROBLEMS: Former smoker (30-40 PY hx, quit 1994) Chronic bronchitis Mild emphysema Nocturnal and exertional hypoxemia H/O laryngeal cancer H/O MI  DATA: Office spirometry 08/06/13: No obstruction CTA 09/16/16: Mild emphysema, bibasilar atelectasis ONO 10/05/16: Frequent desats < 90%. Lowest SpO2 75% CT chest 12/20/17: No suspicious appearing pulmonary nodules or masses. Asbestos related pleural disease. Subtle imaging findings suggestive of underlying asbestosis. New but non-acute compression fracture of superior endplate of L1 with 93% loss of anterior vertebral body height. Mild diffuse bronchial wall thickening with mild centrilobular and paraseptal emphysema; imaging findings suggestive of underlying COPD.  INTERVAL HISTORY: Last seen by me 11/14/17. Treated with Augmentin in early March and early April for purulent bronchitis.   SUBJ: This is a routine re-eval.  Presently he is doing very well.  He has no new complaints.  Breo inhaler was not of any benefit and he is no longer on this medication.  He does not use his rescue inhaler.  The last round of Augmentin took care of bronchitis symptoms.  He denies exertional dyspnea.  He is wearing oxygen with sleep and exertion but takes it off when resting quietly during the day. Denies CP, fever, purulent sputum, hemoptysis, LE edema and calf tenderness   OBJ: Vitals:   03/12/18 1056 03/12/18 1058  BP:  128/72  Pulse:  (!) 55  Resp: 16   SpO2:  93%  Weight: 191 lb (86.6 kg)   Height: 5\' 11"  (1.803 m)    2 LPM [place]  NAD HEENT WNL No JVD BS mildly diminished, no adventitious sounds Regular, no M NABS, NT Extensive gouty arthritis changes in both hands No clubbing cyanosis or edema  DATA: BMP Latest Ref Rng & Units 01/11/2017 09/17/2016 09/16/2016  Glucose 65 - 99 mg/dL - 139(H) 108(H)  BUN 6 - 20 mg/dL - 19 20  Creatinine 0.61 - 1.24 mg/dL 1.70(H) 1.34(H) 1.53(H)  Sodium 135 - 145 mmol/L - 136 138   Potassium 3.5 - 5.1 mmol/L - 4.4 4.2  Chloride 101 - 111 mmol/L - 103 104  CO2 22 - 32 mmol/L - 26 27  Calcium 8.9 - 10.3 mg/dL - 8.2(L) 8.6(L)    CBC Latest Ref Rng & Units 09/17/2016 09/16/2016 10/08/2014  WBC 3.8 - 10.6 K/uL 6.7 10.0 8.0  Hemoglobin 13.0 - 18.0 g/dL 14.6 14.6 15.7  Hematocrit 40.0 - 52.0 % 42.7 42.9 47.3  Platelets 150 - 440 K/uL 109(L) 105(L) 157     CXR: NNF  IMPRESSION: 1) Former smoker 2) Mild emphysema 3) Chronic bronchitis  4) Nocturnal and exertional hypoxemia  5) asbestos related pleural disease (worked for [employer] for several decades)  PLAN: Continue oxygen therapy with sleep and exertion Follow-up in 1 year with chest x-ray  Merton Border, MD PCCM service Mobile [phone] Pager [phone] 03/12/2018. 11:00 AM

## 2018-05-14 ENCOUNTER — Ambulatory Visit: Payer: Medicare Other | Admitting: Radiation Oncology

## 2019-03-11 ENCOUNTER — Telehealth: Payer: Self-pay | Admitting: Pulmonary Disease

## 2019-03-11 NOTE — Telephone Encounter (Signed)
Called and spoke to patients' son in regards to apt for tomorrow. He relayed that he will be present for the phone visit with his father. Nada Boozer (son) also faxed over POA information to be added to patient's file.

## 2019-03-11 NOTE — Telephone Encounter (Signed)
Called patient for COVID-19 pre-screening for in office visit.  Have you recently traveled any where out of the local area in the last 2 weeks? NO  Have you been in close contact with a person diagnosed with COVID-19 within the last 2 weeks? NO  Do you currently have any of the following symptoms? If so, when did they start? Cough     Diarrhea   Joint Pain Fever      Muscle Pain   Red eyes Shortness of breath   Abdominal pain  Vomiting Loss of smell    Rash    Sore Throat Headache    Weakness   Bruising or bleeding   Okay to proceed with visit 03/12/2019

## 2019-03-12 ENCOUNTER — Encounter: Payer: Self-pay | Admitting: Pulmonary Disease

## 2019-03-12 ENCOUNTER — Other Ambulatory Visit: Payer: Self-pay

## 2019-03-12 ENCOUNTER — Ambulatory Visit
Admission: RE | Admit: 2019-03-12 | Discharge: 2019-03-12 | Disposition: A | Discharge status: Home / Self Care | Payer: Medicare Other | Source: Ambulatory Visit | Attending: Pulmonary Disease | Admitting: Pulmonary Disease

## 2019-03-12 ENCOUNTER — Ambulatory Visit (INDEPENDENT_AMBULATORY_CARE_PROVIDER_SITE_OTHER): Payer: Medicare Other | Admitting: Pulmonary Disease

## 2019-03-12 ENCOUNTER — Ambulatory Visit
Admission: RE | Admit: 2019-03-12 | Discharge: 2019-03-12 | Disposition: A | Discharge status: Home / Self Care | Payer: Medicare Other | Attending: Pulmonary Disease | Admitting: Pulmonary Disease

## 2019-03-12 ENCOUNTER — Ambulatory Visit: Payer: Medicare Other | Admitting: Pulmonary Disease

## 2019-03-12 DIAGNOSIS — J9611 Chronic respiratory failure with hypoxia: Secondary | ICD-10-CM | POA: Diagnosis not present

## 2019-03-12 DIAGNOSIS — J432 Centrilobular emphysema: Secondary | ICD-10-CM | POA: Diagnosis not present

## 2019-03-12 DIAGNOSIS — J920 Pleural plaque with presence of asbestos: Secondary | ICD-10-CM | POA: Diagnosis not present

## 2019-03-12 NOTE — Patient Instructions (Signed)
Continue oxygen therapy as close to 24 hours/day as possible Full of oxygen therapy is to keep oxygen saturations 90% or above Follow-up in this office as needed

## 2019-03-12 NOTE — Progress Notes (Signed)
PULMONARY OFFICE FOLLOW UP  PROBLEMS: Former smoker (30-40 PY hx, quit 1994) Chronic bronchitis Mild emphysema Nocturnal and exertional hypoxemia H/O laryngeal cancer H/O MI  DATA: Office spirometry 08/06/13: No obstruction CTA chest 09/16/16: Mild emphysema, bibasilar atelectasis ONO 10/05/16: Frequent desats < 90%. Lowest SpO2 75% CT chest 12/20/17: No suspicious appearing pulmonary nodules or masses. Asbestos related pleural disease. Subtle imaging findings suggestive of underlying asbestosis. New but non-acute compression fracture of superior endplate of L1 with 63% loss of anterior vertebral body height. Mild diffuse bronchial wall thickening with mild centrilobular and paraseptal emphysema; imaging findings suggestive of underlying COPD.  INTERVAL HISTORY:   Virtual Visit via Telephone Note  I connected with Louis Harrison on 03/12/19 at  2:30 PM EDT by telephone and verified that I am speaking with the correct person using two identifiers. I discussed the limitations, risks, security and privacy concerns of performing an evaluation and management service by telephone and the availability of in person appointments. I also discussed with the patient that there may be a patient responsible charge related to this service. The patient expressed understanding and agreed to proceed.    SUBJ: This was a telephone encounter to follow up on the above problems. There have been no major pulmonary or respiratory events since last year (last visit 03/12/2018).  He continues to have chronic cough productive of white mucus.  He denies CP, fever, purulent sputum, hemoptysis, LE edema and calf tenderness.  He is wearing his oxygen with sleep and most of the day.  He states that overall he is wearing it about "three quarters of the day".  He has no significant change in exertional dyspnea.  OBJ: There were no vitals filed for this visit.   Due to the remote nature of this encounter, physical  examination cannot be performed. Hoarse voice quality noted.  This is chronic.  DATA: BMP Latest Ref Rng & Units 01/11/2017 09/17/2016 09/16/2016  Glucose 65 - 99 mg/dL - 139(H) 108(H)  BUN 6 - 20 mg/dL - 19 20  Creatinine 0.61 - 1.24 mg/dL 1.70(H) 1.34(H) 1.53(H)  Sodium 135 - 145 mmol/L - 136 138  Potassium 3.5 - 5.1 mmol/L - 4.4 4.2  Chloride 101 - 111 mmol/L - 103 104  CO2 22 - 32 mmol/L - 26 27  Calcium 8.9 - 10.3 mg/dL - 8.2(L) 8.6(L)    CBC Latest Ref Rng & Units 09/17/2016 09/16/2016 10/08/2014  WBC 3.8 - 10.6 K/uL 6.7 10.0 8.0  Hemoglobin 13.0 - 18.0 g/dL 14.6 14.6 15.7  Hematocrit 40.0 - 52.0 % 42.7 42.9 47.3  Platelets 150 - 440 K/uL 109(L) 105(L) 157     CXR 03/12/19: No acute findings.  Normal-appearing chest x-ray.  Pleural plaques noted previously on CT chest not visible on CXR.  IMPRESSION: 1) Former smoker 2) Mild emphysema 3) Chronic bronchitis  4) Nocturnal and exertional hypoxemia  5) asbestos related pleural disease (worked for [employer] for several decades)  No evidence of progressive asbestos related pulmonary disease noted on current CXR 6) H/O laryngeal cancer, S/P surgery with chronic hoarseness  PLAN: Continue oxygen therapy with sleep and exertion At his son's request, I encouraged more consistent use of oxygen during the daytime with goal SPO2 >90% Follow-up as needed  Merton Border, MD PCCM service Mobile [phone] Pager [phone] 03/12/2019. 1:45 PM

## 2020-10-15 ENCOUNTER — Emergency Department: Payer: Medicare Other

## 2020-10-15 ENCOUNTER — Other Ambulatory Visit: Payer: Self-pay

## 2020-10-15 ENCOUNTER — Encounter: Payer: Self-pay | Admitting: Emergency Medicine

## 2020-10-15 ENCOUNTER — Inpatient Hospital Stay
Admission: EM | Admit: 2020-10-15 | Discharge: 2020-10-27 | DRG: 291 | Disposition: A | Discharge status: Hospice | Payer: Medicare Other | Attending: Internal Medicine | Admitting: Internal Medicine

## 2020-10-15 DIAGNOSIS — R296 Repeated falls: Secondary | ICD-10-CM | POA: Diagnosis present

## 2020-10-15 DIAGNOSIS — N184 Chronic kidney disease, stage 4 (severe): Secondary | ICD-10-CM | POA: Diagnosis not present

## 2020-10-15 DIAGNOSIS — I248 Other forms of acute ischemic heart disease: Secondary | ICD-10-CM | POA: Diagnosis present

## 2020-10-15 DIAGNOSIS — I5033 Acute on chronic diastolic (congestive) heart failure: Secondary | ICD-10-CM | POA: Diagnosis present

## 2020-10-15 DIAGNOSIS — I130 Hypertensive heart and chronic kidney disease with heart failure and stage 1 through stage 4 chronic kidney disease, or unspecified chronic kidney disease: Secondary | ICD-10-CM | POA: Diagnosis present

## 2020-10-15 DIAGNOSIS — Z7982 Long term (current) use of aspirin: Secondary | ICD-10-CM | POA: Diagnosis not present

## 2020-10-15 DIAGNOSIS — Z87891 Personal history of nicotine dependence: Secondary | ICD-10-CM

## 2020-10-15 DIAGNOSIS — N401 Enlarged prostate with lower urinary tract symptoms: Secondary | ICD-10-CM | POA: Diagnosis present

## 2020-10-15 DIAGNOSIS — Z79890 Hormone replacement therapy (postmenopausal): Secondary | ICD-10-CM | POA: Diagnosis not present

## 2020-10-15 DIAGNOSIS — Z20822 Contact with and (suspected) exposure to covid-19: Secondary | ICD-10-CM | POA: Diagnosis present

## 2020-10-15 DIAGNOSIS — Z781 Physical restraint status: Secondary | ICD-10-CM

## 2020-10-15 DIAGNOSIS — I5031 Acute diastolic (congestive) heart failure: Secondary | ICD-10-CM

## 2020-10-15 DIAGNOSIS — R627 Adult failure to thrive: Secondary | ICD-10-CM | POA: Diagnosis present

## 2020-10-15 DIAGNOSIS — Z955 Presence of coronary angioplasty implant and graft: Secondary | ICD-10-CM | POA: Diagnosis not present

## 2020-10-15 DIAGNOSIS — Z79899 Other long term (current) drug therapy: Secondary | ICD-10-CM | POA: Diagnosis not present

## 2020-10-15 DIAGNOSIS — N179 Acute kidney failure, unspecified: Secondary | ICD-10-CM | POA: Diagnosis present

## 2020-10-15 DIAGNOSIS — J411 Mucopurulent chronic bronchitis: Secondary | ICD-10-CM

## 2020-10-15 DIAGNOSIS — E039 Hypothyroidism, unspecified: Secondary | ICD-10-CM | POA: Diagnosis present

## 2020-10-15 DIAGNOSIS — J9610 Chronic respiratory failure, unspecified whether with hypoxia or hypercapnia: Secondary | ICD-10-CM | POA: Diagnosis not present

## 2020-10-15 DIAGNOSIS — I701 Atherosclerosis of renal artery: Secondary | ICD-10-CM | POA: Diagnosis present

## 2020-10-15 DIAGNOSIS — J810 Acute pulmonary edema: Secondary | ICD-10-CM

## 2020-10-15 DIAGNOSIS — Z515 Encounter for palliative care: Secondary | ICD-10-CM | POA: Diagnosis not present

## 2020-10-15 DIAGNOSIS — M1A0411 Idiopathic chronic gout, right hand, with tophus (tophi): Secondary | ICD-10-CM | POA: Diagnosis present

## 2020-10-15 DIAGNOSIS — M25561 Pain in right knee: Secondary | ICD-10-CM

## 2020-10-15 DIAGNOSIS — H9190 Unspecified hearing loss, unspecified ear: Secondary | ICD-10-CM | POA: Diagnosis present

## 2020-10-15 DIAGNOSIS — I959 Hypotension, unspecified: Secondary | ICD-10-CM | POA: Diagnosis not present

## 2020-10-15 DIAGNOSIS — M1A0421 Idiopathic chronic gout, left hand, with tophus (tophi): Secondary | ICD-10-CM | POA: Diagnosis present

## 2020-10-15 DIAGNOSIS — N1831 Chronic kidney disease, stage 3a: Secondary | ICD-10-CM | POA: Diagnosis present

## 2020-10-15 DIAGNOSIS — K219 Gastro-esophageal reflux disease without esophagitis: Secondary | ICD-10-CM | POA: Diagnosis present

## 2020-10-15 DIAGNOSIS — R338 Other retention of urine: Secondary | ICD-10-CM | POA: Diagnosis present

## 2020-10-15 DIAGNOSIS — T502X5A Adverse effect of carbonic-anhydrase inhibitors, benzothiadiazides and other diuretics, initial encounter: Secondary | ICD-10-CM | POA: Diagnosis present

## 2020-10-15 DIAGNOSIS — R4182 Altered mental status, unspecified: Secondary | ICD-10-CM

## 2020-10-15 DIAGNOSIS — Z66 Do not resuscitate: Secondary | ICD-10-CM | POA: Diagnosis present

## 2020-10-15 DIAGNOSIS — E785 Hyperlipidemia, unspecified: Secondary | ICD-10-CM | POA: Diagnosis present

## 2020-10-15 DIAGNOSIS — I252 Old myocardial infarction: Secondary | ICD-10-CM | POA: Diagnosis not present

## 2020-10-15 DIAGNOSIS — Z9114 Patient's other noncompliance with medication regimen: Secondary | ICD-10-CM

## 2020-10-15 DIAGNOSIS — F0390 Unspecified dementia without behavioral disturbance: Secondary | ICD-10-CM | POA: Diagnosis present

## 2020-10-15 DIAGNOSIS — R32 Unspecified urinary incontinence: Secondary | ICD-10-CM | POA: Diagnosis present

## 2020-10-15 DIAGNOSIS — E860 Dehydration: Secondary | ICD-10-CM | POA: Diagnosis present

## 2020-10-15 DIAGNOSIS — J9621 Acute and chronic respiratory failure with hypoxia: Secondary | ICD-10-CM | POA: Diagnosis present

## 2020-10-15 DIAGNOSIS — I2510 Atherosclerotic heart disease of native coronary artery without angina pectoris: Secondary | ICD-10-CM | POA: Diagnosis present

## 2020-10-15 DIAGNOSIS — G309 Alzheimer's disease, unspecified: Secondary | ICD-10-CM | POA: Diagnosis not present

## 2020-10-15 DIAGNOSIS — I10 Essential (primary) hypertension: Secondary | ICD-10-CM | POA: Diagnosis present

## 2020-10-15 DIAGNOSIS — J449 Chronic obstructive pulmonary disease, unspecified: Secondary | ICD-10-CM | POA: Diagnosis present

## 2020-10-15 DIAGNOSIS — F0391 Unspecified dementia with behavioral disturbance: Secondary | ICD-10-CM | POA: Diagnosis present

## 2020-10-15 DIAGNOSIS — Z8521 Personal history of malignant neoplasm of larynx: Secondary | ICD-10-CM | POA: Diagnosis not present

## 2020-10-15 DIAGNOSIS — J441 Chronic obstructive pulmonary disease with (acute) exacerbation: Secondary | ICD-10-CM | POA: Diagnosis present

## 2020-10-15 DIAGNOSIS — F0281 Dementia in other diseases classified elsewhere with behavioral disturbance: Secondary | ICD-10-CM

## 2020-10-15 DIAGNOSIS — I509 Heart failure, unspecified: Secondary | ICD-10-CM

## 2020-10-15 DIAGNOSIS — Z9981 Dependence on supplemental oxygen: Secondary | ICD-10-CM

## 2020-10-15 DIAGNOSIS — Z7189 Other specified counseling: Secondary | ICD-10-CM | POA: Diagnosis not present

## 2020-10-15 LAB — TROPONIN I (HIGH SENSITIVITY)
Troponin I (High Sensitivity): 65 ng/L — ABNORMAL HIGH (ref <18)
Troponin I (High Sensitivity): 70 ng/L — ABNORMAL HIGH (ref <18)
Troponin I (High Sensitivity): 82 ng/L — ABNORMAL HIGH (ref <18)
Troponin I (High Sensitivity): 93 ng/L — ABNORMAL HIGH (ref <18)

## 2020-10-15 LAB — COMPREHENSIVE METABOLIC PANEL
ALT: 17 U/L (ref 0–44)
AST: 19 U/L (ref 15–41)
Albumin: 3.5 g/dL (ref 3.5–5.0)
Alkaline Phosphatase: 58 U/L (ref 38–126)
Anion gap: 9 (ref 5–15)
BUN: 27 mg/dL — ABNORMAL HIGH (ref 8–23)
CO2: 26 mmol/L (ref 22–32)
Calcium: 8.4 mg/dL — ABNORMAL LOW (ref 8.9–10.3)
Chloride: 108 mmol/L (ref 98–111)
Creatinine, Ser: 1.92 mg/dL — ABNORMAL HIGH (ref 0.61–1.24)
GFR, Estimated: 33 mL/min — ABNORMAL LOW (ref >60)
Glucose, Bld: 101 mg/dL — ABNORMAL HIGH (ref 70–99)
Potassium: 4.4 mmol/L (ref 3.5–5.1)
Sodium: 143 mmol/L (ref 135–145)
Total Bilirubin: 1.5 mg/dL — ABNORMAL HIGH (ref 0.3–1.2)
Total Protein: 5.8 g/dL — ABNORMAL LOW (ref 6.5–8.1)

## 2020-10-15 LAB — BLOOD GAS, ARTERIAL
Acid-Base Excess: 3.8 mmol/L — ABNORMAL HIGH (ref 0.0–2.0)
Bicarbonate: 28.5 mmol/L — ABNORMAL HIGH (ref 20.0–28.0)
FIO2: 0.28
O2 Saturation: 89.4 %
Patient temperature: 37
pCO2 arterial: 42 mmHg (ref 32.0–48.0)
pH, Arterial: 7.44 (ref 7.350–7.450)
pO2, Arterial: 55 mmHg — ABNORMAL LOW (ref 83.0–108.0)

## 2020-10-15 LAB — CBC WITH DIFFERENTIAL/PLATELET
Abs Immature Granulocytes: 0.07 10*3/uL (ref 0.00–0.07)
Basophils Absolute: 0.1 10*3/uL (ref 0.0–0.1)
Basophils Relative: 1 %
Eosinophils Absolute: 0.1 10*3/uL (ref 0.0–0.5)
Eosinophils Relative: 1 %
HCT: 47.8 % (ref 39.0–52.0)
Hemoglobin: 15.6 g/dL (ref 13.0–17.0)
Immature Granulocytes: 1 %
Lymphocytes Relative: 10 %
Lymphs Abs: 0.8 10*3/uL (ref 0.7–4.0)
MCH: 34.2 pg — ABNORMAL HIGH (ref 26.0–34.0)
MCHC: 32.6 g/dL (ref 30.0–36.0)
MCV: 104.8 fL — ABNORMAL HIGH (ref 80.0–100.0)
Monocytes Absolute: 0.7 10*3/uL (ref 0.1–1.0)
Monocytes Relative: 9 %
Neutro Abs: 6.2 10*3/uL (ref 1.7–7.7)
Neutrophils Relative %: 78 %
Platelets: 110 10*3/uL — ABNORMAL LOW (ref 150–400)
RBC: 4.56 MIL/uL (ref 4.22–5.81)
RDW: 14.1 % (ref 11.5–15.5)
WBC: 7.8 10*3/uL (ref 4.0–10.5)
nRBC: 0 % (ref 0.0–0.2)

## 2020-10-15 LAB — URINALYSIS, COMPLETE (UACMP) WITH MICROSCOPIC
Bacteria, UA: NONE SEEN
Bilirubin Urine: NEGATIVE
Glucose, UA: NEGATIVE mg/dL
Ketones, ur: NEGATIVE mg/dL
Leukocytes,Ua: NEGATIVE
Nitrite: NEGATIVE
Protein, ur: 30 mg/dL — AB
Specific Gravity, Urine: 1.015 (ref 1.005–1.030)
pH: 5 (ref 5.0–8.0)

## 2020-10-15 LAB — URINE DRUG SCREEN, QUALITATIVE (ARMC ONLY)
Amphetamines, Ur Screen: NOT DETECTED
Barbiturates, Ur Screen: NOT DETECTED
Benzodiazepine, Ur Scrn: NOT DETECTED
Cannabinoid 50 Ng, Ur ~~LOC~~: NOT DETECTED
Cocaine Metabolite,Ur ~~LOC~~: NOT DETECTED
MDMA (Ecstasy)Ur Screen: NOT DETECTED
Methadone Scn, Ur: NOT DETECTED
Opiate, Ur Screen: NOT DETECTED
Phencyclidine (PCP) Ur S: NOT DETECTED
Tricyclic, Ur Screen: NOT DETECTED

## 2020-10-15 LAB — ETHANOL: Alcohol, Ethyl (B): <10 mg/dL (ref <10)

## 2020-10-15 LAB — RESP PANEL BY RT-PCR (FLU A&B, COVID) ARPGX2
Influenza A by PCR: NEGATIVE
Influenza B by PCR: NEGATIVE
SARS Coronavirus 2 by RT PCR: NEGATIVE

## 2020-10-15 LAB — LACTIC ACID, PLASMA
Lactic Acid, Venous: 1.6 mmol/L (ref 0.5–1.9)
Lactic Acid, Venous: 1.4 mmol/L (ref 0.5–1.9)

## 2020-10-15 LAB — BRAIN NATRIURETIC PEPTIDE: B Natriuretic Peptide: 1311.6 pg/mL — ABNORMAL HIGH (ref 0.0–100.0)

## 2020-10-15 LAB — PROTIME-INR
INR: 1.1 (ref 0.8–1.2)
Prothrombin Time: 14.1 seconds (ref 11.4–15.2)

## 2020-10-15 LAB — LIPASE, BLOOD: Lipase: 19 U/L (ref 11–51)

## 2020-10-15 MED ORDER — IPRATROPIUM-ALBUTEROL 0.5-2.5 (3) MG/3ML IN SOLN
3.0000 mL | Freq: Four times a day (QID) | RESPIRATORY_TRACT | Status: DC
  Administered 2020-10-15 – 2020-10-17 (×7): 3 mL via RESPIRATORY_TRACT
  Filled 2020-10-15 (×8): qty 3

## 2020-10-15 MED ORDER — LISINOPRIL 5 MG PO TABS
5.0000 mg | ORAL_TABLET | Freq: Every day | ORAL | Status: DC
  Administered 2020-10-15 – 2020-10-18 (×4): 5 mg via ORAL
  Filled 2020-10-15 (×3): qty 1

## 2020-10-15 MED ORDER — ACETAMINOPHEN 325 MG PO TABS
650.0000 mg | ORAL_TABLET | ORAL | Status: DC | PRN
  Administered 2020-10-22: 650 mg via ORAL
  Filled 2020-10-15: qty 2

## 2020-10-15 MED ORDER — ISOSORBIDE MONONITRATE ER 60 MG PO TB24: 30.0000 mg | ORAL_TABLET | Freq: Every day | ORAL | Status: DC

## 2020-10-15 MED ORDER — LEVOTHYROXINE SODIUM 112 MCG PO TABS: 112.0000 ug | ORAL_TABLET | Freq: Every day | ORAL | Status: DC

## 2020-10-15 MED ORDER — LEVOTHYROXINE SODIUM 50 MCG PO TABS
125.0000 ug | ORAL_TABLET | Freq: Every day | ORAL | Status: DC
  Administered 2020-10-17 – 2020-10-25 (×6): 125 ug via ORAL
  Filled 2020-10-15 (×9): qty 1

## 2020-10-15 MED ORDER — FUROSEMIDE 10 MG/ML IJ SOLN
40.0000 mg | Freq: Once | INTRAMUSCULAR | Status: AC
  Administered 2020-10-15: 14:00:00 40 mg via INTRAVENOUS
  Filled 2020-10-15: qty 4

## 2020-10-15 MED ORDER — SODIUM CHLORIDE 0.9% FLUSH
3.0000 mL | Freq: Two times a day (BID) | INTRAVENOUS | Status: DC
  Administered 2020-10-15 – 2020-10-21 (×8): 3 mL via INTRAVENOUS

## 2020-10-15 MED ORDER — SODIUM CHLORIDE 0.9 % IV SOLN: 250.0000 mL | INTRAVENOUS | Status: DC | PRN

## 2020-10-15 MED ORDER — HALOPERIDOL LACTATE 5 MG/ML IJ SOLN
INTRAMUSCULAR | Status: AC
  Administered 2020-10-15: 22:00:00 2 mg via INTRAVENOUS
  Filled 2020-10-15: qty 1

## 2020-10-15 MED ORDER — HALOPERIDOL LACTATE 5 MG/ML IJ SOLN: 2.0000 mg | Freq: Once | INTRAMUSCULAR | Status: AC

## 2020-10-15 MED ORDER — SODIUM CHLORIDE 0.9% FLUSH: 3.0000 mL | INTRAVENOUS | Status: DC | PRN

## 2020-10-15 MED ORDER — ASPIRIN EC 81 MG PO TBEC
81.0000 mg | DELAYED_RELEASE_TABLET | Freq: Every day | ORAL | Status: DC
  Administered 2020-10-15 – 2020-10-25 (×10): 81 mg via ORAL
  Filled 2020-10-15 (×11): qty 1

## 2020-10-15 MED ORDER — ASPIRIN 325 MG PO TABS: 325.0000 mg | ORAL_TABLET | Freq: Every day | ORAL | Status: DC

## 2020-10-15 MED ORDER — ATENOLOL 25 MG PO TABS
25.0000 mg | ORAL_TABLET | Freq: Every day | ORAL | Status: DC
  Administered 2020-10-15 – 2020-10-26 (×10): 25 mg via ORAL
  Filled 2020-10-15 (×12): qty 1

## 2020-10-15 MED ORDER — ISOSORBIDE MONONITRATE ER 30 MG PO TB24
30.0000 mg | ORAL_TABLET | Freq: Every day | ORAL | Status: DC
  Administered 2020-10-15 – 2020-10-25 (×9): 30 mg via ORAL
  Filled 2020-10-15 (×10): qty 1

## 2020-10-15 MED ORDER — ONDANSETRON HCL 4 MG/2ML IJ SOLN: 4.0000 mg | Freq: Four times a day (QID) | INTRAMUSCULAR | Status: DC | PRN

## 2020-10-15 MED ORDER — ENOXAPARIN SODIUM 30 MG/0.3ML ~~LOC~~ SOLN
30.0000 mg | SUBCUTANEOUS | Status: DC
  Administered 2020-10-15 – 2020-10-16 (×2): 30 mg via SUBCUTANEOUS
  Filled 2020-10-15 (×2): qty 0.3

## 2020-10-15 MED ORDER — TAMSULOSIN HCL 0.4 MG PO CAPS: 0.4000 mg | ORAL_CAPSULE | Freq: Every day | ORAL | Status: DC

## 2020-10-15 MED ORDER — PANTOPRAZOLE SODIUM 40 MG PO TBEC
40.0000 mg | DELAYED_RELEASE_TABLET | Freq: Every day | ORAL | Status: DC
  Administered 2020-10-15 – 2020-10-25 (×9): 40 mg via ORAL
  Filled 2020-10-15 (×10): qty 1

## 2020-10-15 MED ORDER — AZITHROMYCIN 250 MG PO TABS
500.0000 mg | ORAL_TABLET | Freq: Every day | ORAL | Status: AC
  Administered 2020-10-15 – 2020-10-19 (×5): 500 mg via ORAL
  Filled 2020-10-15 (×2): qty 1
  Filled 2020-10-15 (×4): qty 2

## 2020-10-15 MED ORDER — VITAMIN D (ERGOCALCIFEROL) 1.25 MG (50000 UNIT) PO CAPS: 50000.0000 [IU] | ORAL_CAPSULE | ORAL | Status: DC

## 2020-10-15 MED ORDER — SIMVASTATIN 20 MG PO TABS
20.0000 mg | ORAL_TABLET | Freq: Every day | ORAL | Status: DC
  Administered 2020-10-15 – 2020-10-19 (×5): 20 mg via ORAL
  Filled 2020-10-15: qty 2
  Filled 2020-10-15 (×3): qty 1

## 2020-10-15 MED ORDER — ALBUTEROL SULFATE (2.5 MG/3ML) 0.083% IN NEBU: 2.5000 mg | INHALATION_SOLUTION | RESPIRATORY_TRACT | Status: DC | PRN

## 2020-10-15 MED ORDER — HALOPERIDOL LACTATE 5 MG/ML IJ SOLN
1.0000 mg | Freq: Four times a day (QID) | INTRAMUSCULAR | Status: DC | PRN
  Administered 2020-10-16 – 2020-10-17 (×3): 1 mg via INTRAVENOUS
  Filled 2020-10-15 (×4): qty 1

## 2020-10-15 MED ORDER — HALOPERIDOL LACTATE 5 MG/ML IJ SOLN
2.0000 mg | Freq: Once | INTRAMUSCULAR | Status: AC
  Administered 2020-10-15: 14:00:00 2 mg via INTRAVENOUS
  Filled 2020-10-15: qty 1

## 2020-10-15 MED ORDER — FUROSEMIDE 10 MG/ML IJ SOLN
40.0000 mg | Freq: Two times a day (BID) | INTRAMUSCULAR | Status: DC
  Administered 2020-10-16 – 2020-10-18 (×5): 40 mg via INTRAVENOUS
  Filled 2020-10-15 (×4): qty 4

## 2020-10-15 MED ORDER — BUDESONIDE 0.25 MG/2ML IN SUSP
0.2500 mg | Freq: Two times a day (BID) | RESPIRATORY_TRACT | Status: DC
  Administered 2020-10-16 – 2020-10-20 (×9): 0.25 mg via RESPIRATORY_TRACT
  Filled 2020-10-15 (×9): qty 2

## 2020-10-15 NOTE — ED Notes (Signed)
Pt assisted into recliner chair at this time. Pt placed in clean gown, yellow non-skid socks at this time. Clean linens placed on recliner at this time, blanket given. Call light within reach, pt reminded multiple times to use call light for any needs.

## 2020-10-15 NOTE — ED Notes (Signed)
Pt awakens trying to get out of bed. Pt states he wants to go to the bathroom and requests a urinal. Attempt to redirect pt back to bed, informing him that he has an external catheter on. Pt refuses, gets out of bed and takes off gown/o2/monitoring equipment. Pt provided with urinal. Pt uses urinal, then tries to leave room naked "to get to the bathroom."  This RN politely informs him that he is naked, his breathing is getting worse, but pt becomes increasingly agitated. Pt swings at this RN 3 times shouting "get the hell out of my way, get away from me!" and striking this RN in the chest multiple times. This RN did not physically restrain pt or attempt to do so. Pt hobbles into hallway naked and increasingly SOB, obvious increase in WOB. Charge RN called, who was able to talk pt into sitting down in a wheelchair. NP messaged for orders.   After about 1.5 min, pt again attempts to stand, takes off o2, and tries to wander (incoherent attempt to state his intended destination). Charge RN able to eventually guide pt back to bed, though pt becomes increasingly agitated and verbally aggressive. Orders for Haldol received, given, pt asleep in bed soon after and transferred back to main ED

## 2020-10-15 NOTE — ED Notes (Signed)
Pt changed, cleansed, external catheter and bedding changed. Pt positioned on side with pillows for support. Bed alarm on, pt with door open and in direct view of nurse's station. Asleep at this time, no further needs expressed at this time

## 2020-10-15 NOTE — ED Notes (Signed)
Pt placed in clean brief at this time. Clean linens and chux placed on recliner after incontinence episode.

## 2020-10-15 NOTE — H&P (Addendum)
History and Physical    Louis Harrison ZOX:[id number] DOB: 09/27/1928 DOA: 10/15/2020  PCP: [facility]   Patient coming from: Home  I have personally briefly reviewed patient's old medical records in [place]  Chief Complaint: Agitation                               Shortness of breath  Most of the history was obtained from patient's son at the bedside.  Patient has a history of dementia and is also hard of hearing.  HPI: Louis Harrison is a 86 y.o. male with medical history significant for dementia, COPD with chronic respiratory failure, chronic diastolic dysfunction CHF and hypertension who was brought into the ER by EMS for evaluation of mental status changes.  Patient son states that over the last couple of weeks he has had worsening visual hallucinations and agitation which includes refusing care and not wanting people to come into his house to help provide care for him.  He states it has become increasingly difficult to reorient patient and to make him do the things that he needs to do including wearing his oxygen.  Patient lives alone and because he continues to refuse outside help his son is concerned that it is not safe for him to continue to stay home by himself. Patient son states that he has noticed that his father has become increasingly short of breath with exertion and has a chronic cough which is now productive of yellow phlegm.  He does not think he has had any fever or chills. I am unable to do a review of systems on this patient due to his dementia. Labs show sodium 143, potassium 4.4, chloride 108, bicarb 26, glucose 101, BUN 27, creatinine 1.92, calcium 8.4, alkaline phosphatase 58, lipase 19, AST 19, ALT 19, total protein 5.8, BNP 1311, troponin 65 >> 70, lactic acid 1.4  troponin 70, lactic acid 1.4, white count 7.8, hemoglobin 15.6, hematocrit 47.8, MCV 104.8, RDW 14.1, platelet count 110 CT scan of the head without contrast shows no  acute intracranial findings.  Mildly progressive atrophic and chronic small vessel ischemic changes. Chest x-ray reviewed by me shows bibasilar airspace opacities which could reflect viral infection or edema.  Left humerus x-ray shows no acute findings Twelve-lead EKG reviewed by me shows sinus rhythm with Q waves in the lateral leads.    ED Course: Patient is a 86 year old Caucasian male who was brought into the ER by EMS for evaluation of worsening mental status which his son who is his primary caregiver describes as visual hallucinations as well as agitation.  Patient noted to have CHF on his chest x ray as well as elevated BNP levels.  He has a chronic cough with a change in the color of his phlegm which is now yellow but denies having any fever or chills.  He will be admitted to the hospital for further evaluation.  Review of Systems: As per HPI otherwise all systems reviewed and negative.    Past Medical History:  Diagnosis Date  . Cancer [facility])    Larynx cancer.    Marland Kitchen COPD (chronic obstructive pulmonary disease) ([place])   . Heart attack ([place]) 1995  . Hyperlipidemia   . Hypertension   . Renal artery stenosis [facility])     Past Surgical History:  Procedure Laterality Date  . CHOLECYSTECTOMY    . CORONARY ANGIOPLASTY WITH STENT PLACEMENT  reports that he quit smoking about 26 years ago. His smoking use included cigarettes. He has a 45.00 pack-year smoking history. He has never used smokeless tobacco. He reports that he does not drink alcohol and does not use drugs.  No Known Allergies  Family History  Family history unknown: Yes     Prior to Admission medications   Medication Sig Start Date End Date Taking? Authorizing Provider  aspirin 325 MG tablet Take 325 mg by mouth daily.    [provider]  atenolol (TENORMIN) 50 MG tablet Take 25 mg by mouth daily.    [provider]  indomethacin (INDOCIN SR) 75 MG CR capsule Take 1 capsule (75 mg total) by mouth  2 (two) times daily with a meal. Patient taking differently: Take 75 mg by mouth 2 (two) times daily as needed.  01/06/16   Hyatt, Max T, DPM  isosorbide mononitrate (IMDUR) 30 MG 24 hr tablet Take 30 mg by mouth daily.    [provider]  levothyroxine (SYNTHROID, LEVOTHROID) 112 MCG tablet Take 112 mcg by mouth daily. 06/13/17   [provider]  lisinopril (PRINIVIL,ZESTRIL) 5 MG tablet Take 5 mg by mouth daily.    [provider]  omeprazole (PRILOSEC) 20 MG capsule Take 20 mg by mouth daily.    [provider]  simvastatin (ZOCOR) 20 MG tablet Take 20 mg by mouth daily.     [provider]  tamsulosin (FLOMAX) 0.4 MG CAPS capsule Take 0.4 mg by mouth daily.    [provider]  Vitamin D, Ergocalciferol, (DRISDOL) 50000 units CAPS capsule Take 50,000 Units by mouth every 7 (seven) days.    [provider]    Physical Exam: Vitals:   10/15/20 1109 10/15/20 1116 10/15/20 1200 10/15/20 1330  BP:  (!) 172/95 (!) 156/100 (!) 184/100  Pulse:  62 (!) 58 (!) 54  Resp:  15 10 15   Temp:  98.1 F (36.7 C)    TempSrc:  Oral    SpO2:  94% 93% 92%  Weight: 86.2 kg     Height: 5\' 11"  (1.803 m)        Vitals:   10/15/20 1109 10/15/20 1116 10/15/20 1200 10/15/20 1330  BP:  (!) 172/95 (!) 156/100 (!) 184/100  Pulse:  62 (!) 58 (!) 54  Resp:  15 10 15   Temp:  98.1 F (36.7 C)    TempSrc:  Oral    SpO2:  94% 93% 92%  Weight: 86.2 kg     Height: 5\' 11"  (1.803 m)       Constitutional: NAD, alert and oriented x 2.  Patient is oriented to person and place Eyes: PERRL, lids and conjunctivae normal ENMT: Mucous membranes are moist.  Neck: normal, supple, no masses, no thyromegaly Respiratory: Scattered wheezing in all lung fields, faint crackles at the bases. Normal respiratory effort. No accessory muscle use.  Cardiovascular: Regular rate and rhythm, no murmurs / rubs / gallops. No extremity edema. 2+ pedal pulses. No carotid bruits.   Abdomen: no tenderness, no masses palpated. No hepatosplenomegaly. Bowel sounds positive.  Musculoskeletal: no clubbing / cyanosis. No joint deformity upper and lower extremities.  Skin: no rashes, lesions, ulcers.  Neurologic: No gross focal neurologic deficit. Psychiatric: Normal mood and affect.   Labs on Admission: I have personally reviewed following labs and imaging studies  CBC: Recent Labs  Lab 10/15/20 1343  WBC 7.8  NEUTROABS 6.2  HGB 15.6  HCT 47.8  MCV 104.8*  PLT 650*   Basic Metabolic Panel: Recent Labs  Lab 10/15/20 1115  NA 143  K 4.4  CL 108  CO2 26  GLUCOSE 101*  BUN 27*  CREATININE 1.92*  CALCIUM 8.4*   GFR: Estimated Creatinine Clearance: 27.2 mL/min (A) (by C-G formula based on SCr of 1.92 mg/dL (H)). Liver Function Tests: Recent Labs  Lab 10/15/20 1115  AST 19  ALT 17  ALKPHOS 58  BILITOT 1.5*  PROT 5.8*  ALBUMIN 3.5   Recent Labs  Lab 10/15/20 1115  LIPASE 19   No results for input(s): AMMONIA in the last 168 hours. Coagulation Profile: Recent Labs  Lab 10/15/20 1115  INR 1.1   Cardiac Enzymes: No results for input(s): CKTOTAL, CKMB, CKMBINDEX, TROPONINI in the last 168 hours. BNP (last 3 results) No results for input(s): PROBNP in the last 8760 hours. HbA1C: No results for input(s): HGBA1C in the last 72 hours. CBG: No results for input(s): GLUCAP in the last 168 hours. Lipid Profile: No results for input(s): CHOL, HDL, LDLCALC, TRIG, CHOLHDL, LDLDIRECT in the last 72 hours. Thyroid Function Tests: No results for input(s): TSH, T4TOTAL, FREET4, T3FREE, THYROIDAB in the last 72 hours. Anemia Panel: No results for input(s): VITAMINB12, FOLATE, FERRITIN, TIBC, IRON, RETICCTPCT in the last 72 hours. Urine analysis:    Component Value Date/Time   COLORURINE YELLOW (A) 10/15/2020 1115   APPEARANCEUR CLEAR (A) 10/15/2020 1115   LABSPEC 1.015 10/15/2020 1115   PHURINE 5.0 10/15/2020 1115   GLUCOSEU NEGATIVE 10/15/2020  1115   HGBUR SMALL (A) 10/15/2020 1115   BILIRUBINUR NEGATIVE 10/15/2020 1115   KETONESUR NEGATIVE 10/15/2020 1115   PROTEINUR 30 (A) 10/15/2020 1115   NITRITE NEGATIVE 10/15/2020 1115   LEUKOCYTESUR NEGATIVE 10/15/2020 1115    Radiological Exams on Admission: CT Head Wo Contrast  Result Date: 10/15/2020 CLINICAL DATA:  Mental status change.  History of dementia. EXAM: CT HEAD WITHOUT CONTRAST TECHNIQUE: Contiguous axial images were obtained from the base of the skull through the vertex without intravenous contrast. COMPARISON:  CT head 07/31/2014. FINDINGS: Brain: There is no evidence of acute intracranial hemorrhage, mass lesion, brain edema or extra-axial fluid collection. Mildly progressive atrophy with prominence of the ventricles and subarachnoid spaces. There is patchy low-density in the periventricular white matter bilaterally, likely due to chronic small vessel ischemic changes. There is no CT evidence of acute cortical infarction. Vascular:  No hyperdense vessel identified. Skull: Negative for fracture or focal lesion. Sinuses/Orbits: The visualized paranasal sinuses and mastoid air cells are clear. No orbital abnormalities are seen. Other: None. IMPRESSION: 1. No acute intracranial findings. 2. Mildly progressive atrophy and chronic small vessel ischemic changes. Electronically Signed   By: Richardean Sale M.D.   On: 10/15/2020 11:46   DG Chest Port 1 View  Result Date: 10/15/2020 CLINICAL DATA:  Syncopal episodes.  Multiple falls.  Dementia. EXAM: PORTABLE CHEST 1 VIEW COMPARISON:  Radiographs 03/12/2019 and 11/14/2017.  CT 12/20/2017. FINDINGS: 1120 hours. There are significantly lower lung volumes. The heart size and mediastinal contours are stable with aortic atherosclerosis. There are new patchy airspace opacities at both lung bases associated with poor definition of the pulmonary vasculature. No large pleural effusion, confluent airspace opacity or pneumothorax identified.  Telemetry leads overlie the chest. No acute osseous findings. IMPRESSION: Lower lung volumes with new patchy bibasilar airspace opacities which could reflect viral infection or edema. Followup PA and lateral views recommended when the patient is able. Electronically Signed   By: Caryl Comes.D.  On: 10/15/2020 11:44   DG Humerus Left  Result Date: 10/15/2020 CLINICAL DATA:  Frequent falls.  Left arm bruising.  Dementia. EXAM: LEFT HUMERUS - 2+ VIEW COMPARISON:  None. FINDINGS: The mineralization and alignment are normal. There is no evidence of acute fracture or dislocation. Minor degenerative changes at the shoulder and elbow for age. No foreign body or focal soft tissue swelling identified. IMPRESSION: No acute findings. Minor degenerative changes. Electronically Signed   By: Richardean Sale M.D.   On: 10/15/2020 11:41    EKG: Independently reviewed.  Sinus rhythm Q waves in the lateral leads  Assessment/Plan Principal Problem:   Acute CHF (congestive heart failure) (HCC) Active Problems:   Chronic respiratory failure (HCC)   COPD (chronic obstructive pulmonary disease) (HCC)   Hypertension   Coronary artery disease   Dementia (HCC)   CKD (chronic kidney disease), stage IV (HCC)   Hypothyroidism     Acute on chronic diastolic dysfunction CHF Patient's last 2D echocardiogram was from 2018 and showed an LVEF of 70% His blood pressure was significantly elevated We will place patient on Lasix 40 mg IV every 12 Optimize blood pressure control Repeat 2D echocardiogram to assess LVEF We will consult cardiology Maintain low-sodium diet     Hypertension with complications of stage IV chronic kidney disease Blood pressure is uncontrolled and may be related to patient's agitation Continue atenolol, lisinopril and nitrates Will uptitrate doses to optimize blood pressure control    COPD with acute exacerbation/chronic respiratory failure Place patient on scheduled and as  needed bronchodilator therapy Place patient on inhaled steroids We will place patient on Zithromax for 5 days Continue oxygen supplementation at 2 L continuous    History of coronary artery disease status post stent angioplasty Continue aspirin, beta-blockers and statins    Hypothyroidism Continue Synthroid     Dementia Patient son states that patient has visual hallucinations at home and has been increasingly agitated He has continued to refuse outside care and lives alone He has had frequent falls and is unsafe for him remain home alone We will request psychiatry evaluation Case management consult for placement in a skilled nursing facility     Elevated troponin Most likely secondary to demand ischemia from acute CHF We will cycle cardiac enzymes    DVT prophylaxis: Lovenox Code Status: Not resuscitate Family Communication: Greater than 50% of time was spent discussing patient's condition and plan of care with his son who was at the bedside.  All questions and concerns have been addressed, he verbalizes understanding and agrees with the plan. CODE STATUS was discussed and he is a DO NOT RESUSCITATE Disposition Plan: Back to previous home environment Consults called: Cardiology    Louis Trombetta MD [facility]     10/15/2020, 3:51 PM

## 2020-10-15 NOTE — ED Provider Notes (Signed)
[facility] Emergency Department Provider Note   ____________________________________________   Event Date/Time   First MD Initiated Contact with Patient 10/15/20 1108     (approximate)  I have reviewed the triage vital signs and the nursing notes.   HISTORY  Chief Complaint Altered Mental Status    HPI Louis Harrison is a 86 y.o. male with a past medical history of COPD, heart failure, and hypertension who presents via EMS after son called for patient having worsening hallucinations, agitation, and hypoxia.  Patient is severely demented and unable to participate in history or review of systems and therefore history obtained from patient's son who is his power of attorney and primary caregiver.  Son states over the last 2-3 weeks patient's mental status has significantly declined including worsening agitation.  Son also states that he has been responding to internal stimuli fairly frequently and he believes that part of this could be the fact that he does not keep his oxygen on at home.  Patient is currently prescribed 2 L by nasal cannula 24/7 via concentrator at home.  Son states that patient consistently will be found not wearing his oxygen and will be more agitated than he normally is.         Past Medical History:  Diagnosis Date  . Cancer [facility])    Larynx cancer.    Marland Kitchen COPD (chronic obstructive pulmonary disease) ([place])   . Heart attack ([place]) 1995  . Hyperlipidemia   . Hypertension   . Renal artery stenosis [facility])     Patient Active Problem List   Diagnosis Date Noted  . Acute CHF (congestive heart failure) ([place]) 10/15/2020  . Coronary artery disease 04/05/2017  . Acute respiratory failure with hypoxia ([place]) 09/16/2016  . Dysuria 01/06/2016  . Hematuria 01/06/2016  . Hypertension 01/06/2016  . COPD (chronic obstructive pulmonary disease) ([place]) 03/24/2014  . H/O cardiac catheterization 03/24/2014  . Hyperlipidemia 03/24/2014  . MI  (myocardial infarction) ([place]) 03/24/2014  . Renal artery stenosis ([place]) 03/24/2014  . Chronic hypoxemic respiratory failure ([place]) 08/06/2013  . Chronic respiratory failure ([place]) 08/06/2013  . Chronic respiratory failure with hypoxia ([place]) 08/06/2013    Past Surgical History:  Procedure Laterality Date  . CHOLECYSTECTOMY    . CORONARY ANGIOPLASTY WITH STENT PLACEMENT      Prior to Admission medications   Medication Sig Start Date End Date Taking? Authorizing Provider  aspirin 325 MG tablet Take 325 mg by mouth daily.    [provider]  atenolol (TENORMIN) 50 MG tablet Take 25 mg by mouth daily.    [provider]  indomethacin (INDOCIN SR) 75 MG CR capsule Take 1 capsule (75 mg total) by mouth 2 (two) times daily with a meal. Patient taking differently: Take 75 mg by mouth 2 (two) times daily as needed.  01/06/16   Hyatt, Max T, DPM  isosorbide mononitrate (IMDUR) 30 MG 24 hr tablet Take 30 mg by mouth daily.    [provider]  levothyroxine (SYNTHROID, LEVOTHROID) 112 MCG tablet Take 112 mcg by mouth daily. 06/13/17   [provider]  lisinopril (PRINIVIL,ZESTRIL) 5 MG tablet Take 5 mg by mouth daily.    [provider]  omeprazole (PRILOSEC) 20 MG capsule Take 20 mg by mouth daily.    [provider]  simvastatin (ZOCOR) 20 MG tablet Take 20 mg by mouth daily.     [provider]  tamsulosin (FLOMAX) 0.4 MG CAPS capsule Take 0.4 mg by  mouth daily.    [provider]  Vitamin D, Ergocalciferol, (DRISDOL) 50000 units CAPS capsule Take 50,000 Units by mouth every 7 (seven) days.    [provider]    Allergies Patient has no known allergies.  Family History  Family history unknown: Yes    Social History Social History   Tobacco Use  . Smoking status: Former Smoker    Packs/day: 1.50    Years: 30.00    Pack years: 45.00    Types: Cigarettes    Quit date: 10/24/1993    Years since quitting: 26.9   . Smokeless tobacco: Never Used  Vaping Use  . Vaping Use: Never used  Substance Use Topics  . Alcohol use: No  . Drug use: No    Review of Systems Unable to assess ____________________________________________   PHYSICAL EXAM:  VITAL SIGNS: ED Triage Vitals  Enc Vitals Group     BP 10/15/20 1116 (!) 172/95     Pulse Rate 10/15/20 1116 62     Resp 10/15/20 1116 15     Temp 10/15/20 1116 98.1 F (36.7 C)     Temp Source 10/15/20 1116 Oral     SpO2 10/15/20 1116 94 %     Weight 10/15/20 1109 190 lb (86.2 kg)     Height 10/15/20 1109 5\' 11"  (1.803 m)     Head Circumference --      Peak Flow --      Pain Score 10/15/20 1109 0     Pain Loc --      Pain Edu? --      Excl. in [place]? --    Constitutional: Alert and oriented only to self. Well appearing and in no acute distress. Eyes: Conjunctivae are normal. PERRL. Head: Atraumatic. Nose: No congestion/rhinnorhea. Mouth/Throat: Mucous membranes are moist. Neck: No stridor Cardiovascular: Grossly normal heart sounds.  Good peripheral circulation. Respiratory: Increased respiratory effort.  Tachypnea.  3 L by nasal cannula.  Rales over bilateral lung fields Gastrointestinal: Soft and nontender. No distention. Musculoskeletal: No obvious deformities Neurologic:  Normal speech and language. No gross focal neurologic deficits are appreciated. Skin:  Skin is warm and dry. No rash noted. Psychiatric: Mood and affect are normal. Speech and behavior are normal.  ____________________________________________   LABS (all labs ordered are listed, but only abnormal results are displayed)  Labs Reviewed  COMPREHENSIVE METABOLIC PANEL - Abnormal; Notable for the following components:      Result Value   Glucose, Bld 101 (*)    BUN 27 (*)    Creatinine, Ser 1.92 (*)    Calcium 8.4 (*)    Total Protein 5.8 (*)    Total Bilirubin 1.5 (*)    GFR, Estimated 33 (*)    All other components within normal limits  BRAIN NATRIURETIC  PEPTIDE - Abnormal; Notable for the following components:   B Natriuretic Peptide 1,311.6 (*)    All other components within normal limits  URINALYSIS, COMPLETE (UACMP) WITH MICROSCOPIC - Abnormal; Notable for the following components:   Color, Urine YELLOW (*)    APPearance CLEAR (*)    Hgb urine dipstick SMALL (*)    Protein, ur 30 (*)    All other components within normal limits  CBC WITH DIFFERENTIAL/PLATELET - Abnormal; Notable for the following components:   MCV 104.8 (*)    MCH 34.2 (*)    Platelets 110 (*)    All other components within normal limits  TROPONIN I (HIGH SENSITIVITY) - Abnormal; Notable  for the following components:   Troponin I (High Sensitivity) 65 (*)    All other components within normal limits  TROPONIN I (HIGH SENSITIVITY) - Abnormal; Notable for the following components:   Troponin I (High Sensitivity) 70 (*)    All other components within normal limits  RESP PANEL BY RT-PCR (FLU A&B, COVID) ARPGX2  ETHANOL  LIPASE, BLOOD  LACTIC ACID, PLASMA  LACTIC ACID, PLASMA  PROTIME-INR  URINE DRUG SCREEN, QUALITATIVE ([facility] ONLY)  CBC WITH DIFFERENTIAL/PLATELET  BLOOD GAS, ARTERIAL   ____________________________________________  EKG  ED ECG REPORT I, Naaman Plummer, the attending physician, personally viewed and interpreted this ECG.  Date: 10/15/2020 EKG Time: 1140 Rate: 55 Rhythm: normal sinus rhythm QRS Axis: normal Intervals: normal ST/T Wave abnormalities: normal Narrative Interpretation: no evidence of acute ischemia  ____________________________________________  RADIOLOGY  ED MD interpretation: CT of the head without contrast shows no evidence of acute abnormalities.  No evidence of intracranial hemorrhage, edema, mass-effect, or fractures of the calvarium  Single view portable x-ray of the chest shows lower lung volumes and new patchy bilateral airspace opacities concerning for edema  Official radiology report(s): CT Head Wo  Contrast  Result Date: 10/15/2020 CLINICAL DATA:  Mental status change.  History of dementia. EXAM: CT HEAD WITHOUT CONTRAST TECHNIQUE: Contiguous axial images were obtained from the base of the skull through the vertex without intravenous contrast. COMPARISON:  CT head 07/31/2014. FINDINGS: Brain: There is no evidence of acute intracranial hemorrhage, mass lesion, brain edema or extra-axial fluid collection. Mildly progressive atrophy with prominence of the ventricles and subarachnoid spaces. There is patchy low-density in the periventricular white matter bilaterally, likely due to chronic small vessel ischemic changes. There is no CT evidence of acute cortical infarction. Vascular:  No hyperdense vessel identified. Skull: Negative for fracture or focal lesion. Sinuses/Orbits: The visualized paranasal sinuses and mastoid air cells are clear. No orbital abnormalities are seen. Other: None. IMPRESSION: 1. No acute intracranial findings. 2. Mildly progressive atrophy and chronic small vessel ischemic changes. Electronically Signed   By: Richardean Sale M.D.   On: 10/15/2020 11:46   DG Chest Port 1 View  Result Date: 10/15/2020 CLINICAL DATA:  Syncopal episodes.  Multiple falls.  Dementia. EXAM: PORTABLE CHEST 1 VIEW COMPARISON:  Radiographs 03/12/2019 and 11/14/2017.  CT 12/20/2017. FINDINGS: 1120 hours. There are significantly lower lung volumes. The heart size and mediastinal contours are stable with aortic atherosclerosis. There are new patchy airspace opacities at both lung bases associated with poor definition of the pulmonary vasculature. No large pleural effusion, confluent airspace opacity or pneumothorax identified. Telemetry leads overlie the chest. No acute osseous findings. IMPRESSION: Lower lung volumes with new patchy bibasilar airspace opacities which could reflect viral infection or edema. Followup PA and lateral views recommended when the patient is able. Electronically Signed   By: Richardean Sale M.D.   On: 10/15/2020 11:44   DG Humerus Left  Result Date: 10/15/2020 CLINICAL DATA:  Frequent falls.  Left arm bruising.  Dementia. EXAM: LEFT HUMERUS - 2+ VIEW COMPARISON:  None. FINDINGS: The mineralization and alignment are normal. There is no evidence of acute fracture or dislocation. Minor degenerative changes at the shoulder and elbow for age. No foreign body or focal soft tissue swelling identified. IMPRESSION: No acute findings. Minor degenerative changes. Electronically Signed   By: Richardean Sale M.D.   On: 10/15/2020 11:41    ____________________________________________   PROCEDURES  Procedure(s) performed (including Critical Care):  .Critical Care Performed by: Cheri Fowler,  Vista Lawman, MD Authorized by: Naaman Plummer, MD   Critical care provider statement:    Critical care time (minutes):  17   Critical care time was exclusive of:  Separately billable procedures and treating other patients   Critical care was necessary to treat or prevent imminent or life-threatening deterioration of the following conditions:  Respiratory failure   Critical care was time spent personally by me on the following activities:  Discussions with consultants, evaluation of patient's response to treatment, examination of patient, ordering and performing treatments and interventions, ordering and review of laboratory studies, ordering and review of radiographic studies, pulse oximetry, re-evaluation of patient's condition, obtaining history from patient or surrogate and review of old charts   I assumed direction of critical care for this patient from another provider in my specialty: no     Care discussed with: admitting provider   .1-3 Lead EKG Interpretation Performed by: Naaman Plummer, MD Authorized by: Naaman Plummer, MD     Interpretation: normal     ECG rate:  56   ECG rate assessment: normal     Rhythm: sinus rhythm     Ectopy: none     Conduction: normal        ____________________________________________   INITIAL IMPRESSION / ASSESSMENT AND PLAN / ED COURSE  As part of my medical decision making, I reviewed the following data within the [facility] notes reviewed and incorporated, Labs reviewed, EKG interpreted, Old chart reviewed, Radiograph reviewed and Notes from prior ED visits reviewed and incorporated        + dyspnea -LE edema + Non adherence to medication regimen  Workup: ECG, CBC, BMP, Troponin, BNP, CXR Findings: EKG: No STEMI and no evidence of Brugadas sign, delta wave, epsilon wave, significantly prolonged QTc, or malignant arrhythmia. BNP: 1311 CXR: Bilateral pulmonary edema Based on history, exam and findings, presentation most consistent with acute on chronic heart failure. Low suspicion for PNA, ACS, tamponade, aortic dissection. Interventions: Oxygen, Diuresis  Reassessment: Symptoms improved in ED with oxygen and diuresis  Disposition (Stable but not significantly improved): Admit to medicine for further monitoring and for improvement of medication regimen to control symptoms.      ____________________________________________   FINAL CLINICAL IMPRESSION(S) / ED DIAGNOSES  Final diagnoses:  Acute on chronic respiratory failure with hypoxia (HCC)  Acute pulmonary edema (HCC)  Altered mental status, unspecified altered mental status type     ED Discharge Orders    None       Note:  This document was prepared using Dragon voice recognition software and may include unintentional dictation errors.   Naaman Plummer, MD 10/15/20 [phone]

## 2020-10-15 NOTE — ED Notes (Signed)
Per Son/POA, pt is in renal failure. MD made aware at this time.

## 2020-10-15 NOTE — ED Notes (Addendum)
Pt cleaned and clean chux and brief placed on pt at this time after incontinence episode

## 2020-10-15 NOTE — ED Notes (Signed)
Pt cleaned and clean chux and brief placed at this time after incontinence episode

## 2020-10-15 NOTE — ED Notes (Signed)
Messaged admitting MD Agbata regarding PO intake at this time

## 2020-10-15 NOTE — ED Triage Notes (Addendum)
Pt arrived via [facility] from home. Per EMS, pt has hx of dementia. Per EMS, pt POA states pt dementia has been getting "worse very quickly" and pt is having increased hallucinations and frequent falls.  Per EMS, Pt POA states that pt cannot live alone anymore as it is unsafe and needs placement somewhere.   Per EMS, VSS except BP 180/102  Per EMS, pt has not had home meds this morning and wears O2 at home.   Pt visualized to have large bruising to L arm that spans from shoulder to elbow. PT visualized to have bruising on buttocks and redness in belly button

## 2020-10-15 NOTE — ED Notes (Signed)
External male cath placed at this time. Pt assisted into hospital bed, clean chux and linens placed at this time

## 2020-10-15 NOTE — ED Notes (Signed)
Son's home phone #: [phone]

## 2020-10-15 NOTE — ED Notes (Addendum)
MD Agbata notified at this time that pt was given 40mg  IV lasix at 1423.Per MD Agbata, reschedule 1600 dose for 1800.

## 2020-10-15 NOTE — ED Notes (Signed)
Pt to CT at this time.

## 2020-10-15 NOTE — ED Notes (Signed)
Called respiratory to draw ABG at this time. Per RT, they did not see the order until late and are waiting for MD Agbata confirmation on the order before they come draw.

## 2020-10-15 NOTE — ED Notes (Signed)
Pt's urinary cath leaked at this time. Pt linens were saturated at this time. Pt cleaned, clean linens applied, clean gown applied, clean chux applied. External cath secured at this time.   Pt sat up in bed and given meal tray at this time.

## 2020-10-15 NOTE — Consult Note (Signed)
[facility] Face-to-Face Psychiatry Consult   Reason for Consult: Consult for 86 year old man who came to the hospital with increased confusion Referring Physician:  Agbata Patient Identification: Louis Harrison MRN:  [id number] Principal Diagnosis: Dementia [facility]) Diagnosis:  Principal Problem:   Dementia ([place]) Active Problems:   Chronic respiratory failure ([place])   COPD (chronic obstructive pulmonary disease) ([place])   Hypertension   Coronary artery disease   Acute CHF (congestive heart failure) ([place])   CKD (chronic kidney disease), stage IV ([place])   Hypothyroidism   Total Time spent with patient: 1 hour  Subjective:   Louis Harrison is a 86 y.o. male patient admitted with "who?".  HPI: Patient seen chart reviewed.  Most history obtained from the son who is present.  Son reports that his father lives by himself since his wife died a couple years ago.  It has been known that he had dementia for quite a while.  Son comes by and checks on him every night.  He noticed some significant worsening of the dementia within the past couple weeks.  Patient has had more episodes of visual hallucinations and more episodes of acting out and potentially dangerous ways.  Not physically violent but agitated at times and argumentative.  At this point the son feels that it is no longer possible for his father to live independently and has mentioned the need for placement.  Patient himself not giving any specific history.  Past Psychiatric History: No known past psychiatric history other than it having been identified that the patient has some dementia.  Apparently has not seen a neurologist or any other specialist for the dementia.  Not on any medicine for it.  Risk to Self:   Risk to Others:   Prior Inpatient Therapy:   Prior Outpatient Therapy:    Past Medical History:  Past Medical History:  Diagnosis Date  . Cancer [facility])    Larynx cancer.    Marland Kitchen COPD (chronic obstructive pulmonary disease) ([place])   .  Heart attack ([place]) 1995  . Hyperlipidemia   . Hypertension   . Renal artery stenosis [facility])     Past Surgical History:  Procedure Laterality Date  . CHOLECYSTECTOMY    . CORONARY ANGIOPLASTY WITH STENT PLACEMENT     Family History:  Family History  Family history unknown: Yes   Family Psychiatric  History: None reported Social History:  Social History   Substance and Sexual Activity  Alcohol Use No     Social History   Substance and Sexual Activity  Drug Use No    Social History   Socioeconomic History  . Marital status: Widowed    Spouse name: Not on file  . Number of children: Not on file  . Years of education: Not on file  . Highest education level: Not on file  Occupational History  . Not on file  Tobacco Use  . Smoking status: Former Smoker    Packs/day: 1.50    Years: 30.00    Pack years: 45.00    Types: Cigarettes    Quit date: 10/24/1993    Years since quitting: 26.9  . Smokeless tobacco: Never Used  Vaping Use  . Vaping Use: Never used  Substance and Sexual Activity  . Alcohol use: No  . Drug use: No  . Sexual activity: Never    Birth control/protection: Abstinence  Other Topics Concern  . Not on file  Social History Narrative  . Not on file   Social Determinants of Health  Financial Resource Strain: Not on file  Food Insecurity: Not on file  Transportation Needs: Not on file  Physical Activity: Not on file  Stress: Not on file  Social Connections: Not on file   Additional Social History:    Allergies:  No Known Allergies  Labs:  Results for orders placed or performed during the hospital encounter of 10/15/20 (from the past 48 hour(s))  Comprehensive metabolic panel     Status: Abnormal   Collection Time: 10/15/20 11:15 AM  Result Value Ref Range   Sodium 143 135 - 145 mmol/L   Potassium 4.4 3.5 - 5.1 mmol/L   Chloride 108 98 - 111 mmol/L   CO2 26 22 - 32 mmol/L   Glucose, Bld 101 (H) 70 - 99 mg/dL    Comment: Glucose reference  range applies only to samples taken after fasting for at least 8 hours.   BUN 27 (H) 8 - 23 mg/dL   Creatinine, Ser 1.92 (H) 0.61 - 1.24 mg/dL   Calcium 8.4 (L) 8.9 - 10.3 mg/dL   Total Protein 5.8 (L) 6.5 - 8.1 g/dL   Albumin 3.5 3.5 - 5.0 g/dL   AST 19 15 - 41 U/L   ALT 17 0 - 44 U/L   Alkaline Phosphatase 58 38 - 126 U/L   Total Bilirubin 1.5 (H) 0.3 - 1.2 mg/dL   GFR, Estimated 33 (L) >60 mL/min    Comment: (NOTE) Calculated using the CKD-EPI Creatinine Equation (2021)    Anion gap 9 5 - 15    Comment: Performed at [facility], [address] [place], [place] [address]  Ethanol     Status: None   Collection Time: 10/15/20 11:15 AM  Result Value Ref Range   Alcohol, Ethyl (B) <10 <10 mg/dL    Comment: (NOTE) Lowest detectable limit for serum alcohol is 10 mg/dL.  For medical purposes only. Performed at [facility], [address] [place], [place] [address]   Lipase, blood     Status: None   Collection Time: 10/15/20 11:15 AM  Result Value Ref Range   Lipase 19 11 - 51 U/L    Comment: Performed at [facility], [address] [place], [place] [address]  Brain natriuretic peptide     Status: Abnormal   Collection Time: 10/15/20 11:15 AM  Result Value Ref Range   B Natriuretic Peptide 1,311.6 (H) 0.0 - 100.0 pg/mL    Comment: Performed at [facility], [place], [place] [address]  Troponin I (High Sensitivity)     Status: Abnormal   Collection Time: 10/15/20 11:15 AM  Result Value Ref Range   Troponin I (High Sensitivity) 65 (H) <18 ng/L    Comment: (NOTE) Elevated high sensitivity troponin I (hsTnI) values and significant  changes across serial measurements may suggest ACS but many other  chronic and acute conditions are known to elevate hsTnI results.  Refer to the "Links" section for chest pain algorithms and additional  guidance. Performed at [facility], [address] [place], [place]  [address]   Lactic acid, plasma     Status: None   Collection Time: 10/15/20 11:15 AM  Result Value Ref Range   Lactic Acid, Venous 1.6 0.5 - 1.9 mmol/L    Comment: Performed at [facility], [address] [place], [place] [address]  Protime-INR     Status: None   Collection Time: 10/15/20 11:15 AM  Result Value Ref Range   Prothrombin  Time 14.1 11.4 - 15.2 seconds   INR 1.1 0.8 - 1.2    Comment: (NOTE) INR goal varies based on device and disease states. Performed at [facility], [address] [place], [place] [address]   Urinalysis, Complete w Microscopic     Status: Abnormal   Collection Time: 10/15/20 11:15 AM  Result Value Ref Range   Color, Urine YELLOW (A) YELLOW   APPearance CLEAR (A) CLEAR   Specific Gravity, Urine 1.015 1.005 - 1.030   pH 5.0 5.0 - 8.0   Glucose, UA NEGATIVE NEGATIVE mg/dL   Hgb urine dipstick SMALL (A) NEGATIVE   Bilirubin Urine NEGATIVE NEGATIVE   Ketones, ur NEGATIVE NEGATIVE mg/dL   Protein, ur 30 (A) NEGATIVE mg/dL   Nitrite NEGATIVE NEGATIVE   Leukocytes,Ua NEGATIVE NEGATIVE   RBC / HPF 0-5 0 - 5 RBC/hpf   WBC, UA 0-5 0 - 5 WBC/hpf   Bacteria, UA NONE SEEN NONE SEEN   Squamous Epithelial / LPF 0-5 0 - 5   Mucus PRESENT     Comment: Performed at [facility], [address], [place], [place] [address]  Urine Drug Screen, Qualitative     Status: None   Collection Time: 10/15/20 11:15 AM  Result Value Ref Range   Tricyclic, Ur Screen NONE DETECTED NONE DETECTED   Amphetamines, Ur Screen NONE DETECTED NONE DETECTED   MDMA (Ecstasy)Ur Screen NONE DETECTED NONE DETECTED   Cocaine Metabolite,Ur [place] NONE DETECTED NONE DETECTED   Opiate, Ur Screen NONE DETECTED NONE DETECTED   Phencyclidine (PCP) Ur S NONE DETECTED NONE DETECTED   Cannabinoid 50 Ng, Ur [place] NONE DETECTED NONE DETECTED   Barbiturates, Ur Screen NONE DETECTED NONE DETECTED   Benzodiazepine, Ur Scrn NONE DETECTED NONE DETECTED   Methadone Scn, Ur NONE  DETECTED NONE DETECTED    Comment: (NOTE) Tricyclics + metabolites, urine    Cutoff 1000 ng/mL Amphetamines + metabolites, urine  Cutoff 1000 ng/mL MDMA (Ecstasy), urine              Cutoff 500 ng/mL Cocaine Metabolite, urine          Cutoff 300 ng/mL Opiate + metabolites, urine        Cutoff 300 ng/mL Phencyclidine (PCP), urine         Cutoff 25 ng/mL Cannabinoid, urine                 Cutoff 50 ng/mL Barbiturates + metabolites, urine  Cutoff 200 ng/mL Benzodiazepine, urine              Cutoff 200 ng/mL Methadone, urine                   Cutoff 300 ng/mL  The urine drug screen provides only a preliminary, unconfirmed analytical test result and should not be used for non-medical purposes. Clinical consideration and professional judgment should be applied to any positive drug screen result due to possible interfering substances. A more specific alternate chemical method must be used in order to obtain a confirmed analytical result. Gas chromatography / mass spectrometry (GC/MS) is the preferred confirm atory method. Performed at [facility], [address] [place], [place] [address]   Resp Panel by RT-PCR (Flu A&B, Covid) Nasopharyngeal Swab     Status: None   Collection Time: 10/15/20 11:38 AM   Specimen: Nasopharyngeal Swab; Nasopharyngeal(NP) swabs in vial transport medium  Result Value Ref Range   SARS Coronavirus 2 by RT PCR NEGATIVE NEGATIVE  Comment: (NOTE) SARS-CoV-2 target nucleic acids are NOT DETECTED.  The SARS-CoV-2 RNA is generally detectable in upper respiratory specimens during the acute phase of infection. The lowest concentration of SARS-CoV-2 viral copies this assay can detect is 138 copies/mL. A negative result does not preclude SARS-Cov-2 infection and should not be used as the sole basis for treatment or other patient management decisions. A negative result may occur with  improper specimen collection/handling, submission of specimen  other than nasopharyngeal swab, presence of viral mutation(s) within the areas targeted by this assay, and inadequate number of viral copies(<138 copies/mL). A negative result must be combined with clinical observations, patient history, and epidemiological information. The expected result is Negative.  Fact Sheet for Patients:  EntrepreneurPulse.com.au  Fact Sheet for Healthcare Providers:  IncredibleEmployment.be  This test is no t yet approved or cleared by the [place] FDA and  has been authorized for detection and/or diagnosis of SARS-CoV-2 by FDA under an Emergency Use Authorization (EUA). This EUA will remain  in effect (meaning this test can be used) for the duration of the COVID-19 declaration under Section 564(b)(1) of the Act, 21 U.S.C.section 360bbb-3(b)(1), unless the authorization is terminated  or revoked sooner.       Influenza A by PCR NEGATIVE NEGATIVE   Influenza B by PCR NEGATIVE NEGATIVE    Comment: (NOTE) The Xpert Xpress SARS-CoV-2/FLU/RSV plus assay is intended as an aid in the diagnosis of influenza from Nasopharyngeal swab specimens and should not be used as a sole basis for treatment. Nasal washings and aspirates are unacceptable for Xpert Xpress SARS-CoV-2/FLU/RSV testing.  Fact Sheet for Patients: EntrepreneurPulse.com.au  Fact Sheet for Healthcare Providers: IncredibleEmployment.be  This test is not yet approved or cleared by the [place] FDA and has been authorized for detection and/or diagnosis of SARS-CoV-2 by FDA under an Emergency Use Authorization (EUA). This EUA will remain in effect (meaning this test can be used) for the duration of the COVID-19 declaration under Section 564(b)(1) of the Act, 21 U.S.C. section 360bbb-3(b)(1), unless the authorization is terminated or revoked.  Performed at [facility], [address] [place], [place]  [address]   Lactic acid, plasma     Status: None   Collection Time: 10/15/20  1:43 PM  Result Value Ref Range   Lactic Acid, Venous 1.4 0.5 - 1.9 mmol/L    Comment: Performed at [facility], [place], [place] [address]  Troponin I (High Sensitivity)     Status: Abnormal   Collection Time: 10/15/20  1:43 PM  Result Value Ref Range   Troponin I (High Sensitivity) 70 (H) <18 ng/L    Comment: (NOTE) Elevated high sensitivity troponin I (hsTnI) values and significant  changes across serial measurements may suggest ACS but many other  chronic and acute conditions are known to elevate hsTnI results.  Refer to the "Links" section for chest pain algorithms and additional  guidance. Performed at [facility], [address] [place], [place] [address]   CBC with Differential/Platelet     Status: Abnormal   Collection Time: 10/15/20  1:43 PM  Result Value Ref Range   WBC 7.8 4.0 - 10.5 K/uL   RBC 4.56 4.22 - 5.81 MIL/uL   Hemoglobin 15.6 13.0 - 17.0 g/dL   HCT 47.8 39.0 - 52.0 %   MCV 104.8 (H) 80.0 - 100.0 fL   MCH 34.2 (H) 26.0 - 34.0 pg   MCHC 32.6 30.0 - 36.0 g/dL   RDW 14.1  11.5 - 15.5 %   Platelets 110 (L) 150 - 400 K/uL    Comment: Immature Platelet Fraction may be clinically indicated, consider ordering this additional test [id number]    nRBC 0.0 0.0 - 0.2 %   Neutrophils Relative % 78 %   Neutro Abs 6.2 1.7 - 7.7 K/uL   Lymphocytes Relative 10 %   Lymphs Abs 0.8 0.7 - 4.0 K/uL   Monocytes Relative 9 %   Monocytes Absolute 0.7 0.1 - 1.0 K/uL   Eosinophils Relative 1 %   Eosinophils Absolute 0.1 0.0 - 0.5 K/uL   Basophils Relative 1 %   Basophils Absolute 0.1 0.0 - 0.1 K/uL   Immature Granulocytes 1 %   Abs Immature Granulocytes 0.07 0.00 - 0.07 K/uL    Comment: Performed at [facility], [address], [place], [place] [address]    Current Facility-Administered Medications  Medication Dose Route Frequency Provider Last Rate  Last Admin  . 0.9 %  sodium chloride infusion  250 mL Intravenous PRN Agbata, Tochukwu, MD      . acetaminophen (TYLENOL) tablet 650 mg  650 mg Oral Q4H PRN Agbata, Tochukwu, MD      . albuterol (PROVENTIL) (2.5 MG/3ML) 0.083% nebulizer solution 2.5 mg  2.5 mg Nebulization Q4H PRN Agbata, Tochukwu, MD      . aspirin tablet 325 mg  325 mg Oral Daily Agbata, Tochukwu, MD      . atenolol (TENORMIN) tablet 25 mg  25 mg Oral Daily Agbata, Tochukwu, MD   25 mg at 10/15/20 1653  . azithromycin (ZITHROMAX) tablet 500 mg  500 mg Oral Daily Agbata, Tochukwu, MD   500 mg at 10/15/20 1652  . budesonide (PULMICORT) nebulizer solution 0.25 mg  0.25 mg Nebulization BID Agbata, Tochukwu, MD      . enoxaparin (LOVENOX) injection 30 mg  30 mg Subcutaneous Q24H Agbata, Tochukwu, MD   30 mg at 10/15/20 1654  . furosemide (LASIX) injection 40 mg  40 mg Intravenous Q12H Agbata, Tochukwu, MD      . ipratropium-albuterol (DUONEB) 0.5-2.5 (3) MG/3ML nebulizer solution 3 mL  3 mL Nebulization Q6H Agbata, Tochukwu, MD   3 mL at 10/15/20 1654  . isosorbide mononitrate (IMDUR) 24 hr tablet 30 mg  30 mg Oral Daily Agbata, Tochukwu, MD   30 mg at 10/15/20 1651  . levothyroxine (SYNTHROID) tablet 112 mcg  112 mcg Oral Daily Agbata, Tochukwu, MD      . lisinopril (ZESTRIL) tablet 5 mg  5 mg Oral Daily Agbata, Tochukwu, MD   5 mg at 10/15/20 1651  . ondansetron (ZOFRAN) injection 4 mg  4 mg Intravenous Q6H PRN Agbata, Tochukwu, MD      . pantoprazole (PROTONIX) EC tablet 40 mg  40 mg Oral Daily Agbata, Tochukwu, MD   40 mg at 10/15/20 1653  . simvastatin (ZOCOR) tablet 20 mg  20 mg Oral Daily Agbata, Tochukwu, MD   20 mg at 10/15/20 1653  . sodium chloride flush (NS) 0.9 % injection 3 mL  3 mL Intravenous Q12H Agbata, Tochukwu, MD      . sodium chloride flush (NS) 0.9 % injection 3 mL  3 mL Intravenous PRN Agbata, Tochukwu, MD      . tamsulosin (FLOMAX) capsule 0.4 mg  0.4 mg Oral Daily Agbata, Tochukwu, MD      . Vitamin D  (Ergocalciferol) (DRISDOL) capsule 50,000 Units  50,000 Units Oral Q7 days Collier Bullock, MD       Current Outpatient  Medications  Medication Sig Dispense Refill  . aspirin 325 MG tablet Take 325 mg by mouth daily.    Marland Kitchen atenolol (TENORMIN) 50 MG tablet Take 25 mg by mouth daily.    . indomethacin (INDOCIN SR) 75 MG CR capsule Take 1 capsule (75 mg total) by mouth 2 (two) times daily with a meal. (Patient taking differently: Take 75 mg by mouth 2 (two) times daily as needed. ) 60 capsule 1  . isosorbide mononitrate (IMDUR) 30 MG 24 hr tablet Take 30 mg by mouth daily.    Marland Kitchen levothyroxine (SYNTHROID, LEVOTHROID) 112 MCG tablet Take 112 mcg by mouth daily.    Marland Kitchen lisinopril (PRINIVIL,ZESTRIL) 5 MG tablet Take 5 mg by mouth daily.    Marland Kitchen omeprazole (PRILOSEC) 20 MG capsule Take 20 mg by mouth daily.    . simvastatin (ZOCOR) 20 MG tablet Take 20 mg by mouth daily.     . tamsulosin (FLOMAX) 0.4 MG CAPS capsule Take 0.4 mg by mouth daily.    . Vitamin D, Ergocalciferol, (DRISDOL) 50000 units CAPS capsule Take 50,000 Units by mouth every 7 (seven) days.      Musculoskeletal: Strength & Muscle Tone: decreased Gait & Station: unsteady Patient leans: N/A  Psychiatric Specialty Exam: Physical Exam Vitals and nursing note reviewed.  Constitutional:      Appearance: He is well-developed and well-nourished.  HENT:     Head: Normocephalic and atraumatic.  Eyes:     Conjunctiva/sclera: Conjunctivae normal.     Pupils: Pupils are equal, round, and reactive to light.  Cardiovascular:     Heart sounds: Normal heart sounds.  Pulmonary:     Effort: Pulmonary effort is normal.  Abdominal:     Palpations: Abdomen is soft.  Musculoskeletal:        General: Normal range of motion.     Cervical back: Normal range of motion.  Skin:    General: Skin is warm and dry.  Neurological:     Mental Status: He is alert.  Psychiatric:        Attention and Perception: He is inattentive.        Mood and Affect:  Affect is blunt.        Speech: Speech is tangential.        Behavior: Behavior is not agitated or aggressive.        Thought Content: Thought content does not include homicidal or suicidal ideation.        Cognition and Memory: Cognition is impaired. Memory is impaired.        Judgment: Judgment is impulsive.     Review of Systems  Unable to perform ROS: Dementia    Blood pressure (!) 161/90, pulse (!) 54, temperature 98.1 F (36.7 C), temperature source Oral, resp. rate 15, height 5\' 11"  (1.803 m), weight 86.2 kg, SpO2 92 %.Body mass index is 26.5 kg/m.  General Appearance: Casual  Eye Contact:  Minimal  Speech:  Slow  Volume:  Decreased  Mood:  Euthymic  Affect:  Constricted  Thought Process:  Disorganized  Orientation:  Negative  Thought Content:  Tangential  Suicidal Thoughts:  No  Homicidal Thoughts:  No  Memory:  Immediate;   Fair Recent;   Poor Remote;   Poor  Judgement:  Impaired  Insight:  Shallow  Psychomotor Activity:  Decreased  Concentration:  Concentration: Poor  Recall:  Poor  Fund of Knowledge:  Fair  Language:  Fair  Akathisia:  No  Handed:  Right  AIMS (if  indicated):     Assets:  Desire for Improvement Social Support  ADL's:  Impaired  Cognition:  Impaired,  Mild and Moderate  Sleep:        Treatment Plan Summary: Medication management and Plan 86 year old man with dementia who has begun having more visual hallucinations more confusion and more spells of agitation making his family feel that he is no longer safe at home.  Recent CT scan does not show any specific or new lesion to the brain.  Labs so far show mild anemia and not much else nothing specific.  I presume a urine analysis will be done.  Might be a good idea to check B12 levels thyroid tests at least.  In terms of treating this I think the main issue will be whether he gets agitated with sundowning or putting himself at risk of falls in the hospital.  I explained to the son that that is a  common occurrence and that for safety we often use antipsychotic medications to help control confusion.  Son understood this and was agreeable.  I will put in orders for IV haloperidol as preferred medication.  Disposition: No evidence of imminent risk to self or others at present.   Patient does not meet criteria for psychiatric inpatient admission.  Alethia Berthold, MD 10/15/2020 5:22 PM

## 2020-10-15 NOTE — TOC Initial Note (Signed)
Transition of Care [facility]) - Initial/Assessment Note    Patient Details  Name: Louis Harrison MRN: [id number] Date of Birth: [birth year]  Transition of [facility]) CM/SW Contact:    Iona Beard, [place] Phone Number: 10/15/2020, 7:18 PM  Clinical Narrative:                 Pt admitted due to dementia. TOC consulted for CHF screen. CSW spoke with pts RN about pts orientation. Per RN pt is not fully orientated but pts son is at bedside. CSW spoke with pts son Arthuro Canelo. Silfies to complete assessment. Per Mr. Strohm pt lives alone and is able to complete ADL independently. However, over the last 2-3 weeks pt has had more difficulty. Pt has been driving but pts son does not think he should be any longer. Pt has not had HH. Pt has a cane and uses 2L O2 at home. Pts son is unsure of what company is used. Pts son is concerned about pt returning home alone. CSW informed pts son about [facility] services and SNF. CSW informed pts son that PT will need to see pt to make recommendation. Pts son understanding and would like PT to evaluate due to pts recent weakness. Per pts son pt has been resistent to going somewhere in the past and would like someone to speak with them more about options. CSW informed that TOC will continue to stay and f/u after PT has made a recommendation.   CSW completed CHF screen with pts son. Pt has not been weighing daily. CSW informed pts son of importance of daily weights. Pt does not follow a heart healthy diet. Pt is followed by a cardiologist. Pt does take his medications as they are prescribed. TOC to follow.   Expected Discharge Plan: Skilled Nursing Facility Barriers to Discharge: Continued Medical Work up   Patient Goals and CMS Choice Patient states their goals for this hospitalization and ongoing recovery are:: Gain strength back and possbily go to SNF for rehab   Choice offered to / list presented to : NA  Expected Discharge Plan and Services Expected Discharge Plan:  [place] In-house Referral: Clinical Social Work Discharge Planning Services: CM Consult Post Acute Care Choice: [place] arrangements for the past 2 months: Single Family Home                                      Prior Living Arrangements/Services Living arrangements for the past 2 months: Single Family Home Lives with:: Self Patient language and need for interpreter reviewed:: Yes Do you feel safe going back to the place where you live?: Yes      Need for Family Participation in Patient Care: Yes (Comment) Care giver support system in place?: Yes (comment) Current home services: DME,Other (comment) Kasandra Knudsen, weekly cleaner in the home) Criminal Activity/Legal Involvement Pertinent to Current Situation/Hospitalization: No - Comment as needed  Activities of Daily Living      Permission Sought/Granted                  Emotional Assessment   Attitude/Demeanor/Rapport: Unable to Assess Affect (typically observed): Unable to Assess Orientation: : Fluctuating Orientation (Suspected and/or reported Sundowners) Alcohol / Substance Use: Not Applicable Psych Involvement: No (comment)  Admission diagnosis:  Acute CHF (congestive heart failure) ([place]) [I50.9] Patient Active Problem List   Diagnosis Date Noted  . Acute CHF (  congestive heart failure) ([place]) 10/15/2020  . Dementia ([place]) 10/15/2020  . CKD (chronic kidney disease), stage IV ([place]) 10/15/2020  . Hypothyroidism 10/15/2020  . Coronary artery disease 04/05/2017  . Acute respiratory failure with hypoxia ([place]) 09/16/2016  . Dysuria 01/06/2016  . Hematuria 01/06/2016  . Hypertension 01/06/2016  . COPD (chronic obstructive pulmonary disease) ([place]) 03/24/2014  . H/O cardiac catheterization 03/24/2014  . Hyperlipidemia 03/24/2014  . MI (myocardial infarction) ([place]) 03/24/2014  . Renal artery stenosis ([place]) 03/24/2014  . Chronic hypoxemic respiratory failure ([place]) 08/06/2013  .  Chronic respiratory failure ([place]) 08/06/2013  . Chronic respiratory failure with hypoxia ([place]) 08/06/2013   PCP:  [facility] [place]:   [place], [place] [address] [place] [address] Phone: [phone] Fax: [phone]     Social Determinants of Health (SDOH) Interventions    Readmission Risk Interventions No flowsheet data found.

## 2020-10-15 NOTE — ED Notes (Signed)
X-Ray at bedside.

## 2020-10-15 NOTE — ED Notes (Signed)
Pt tolerating blow by o2 at this time, no other monitoring due to pt combativeness

## 2020-10-16 ENCOUNTER — Inpatient Hospital Stay
Admit: 2020-10-16 | Discharge: 2020-10-16 | Disposition: A | Discharge status: Home / Self Care | Payer: Medicare Other | Attending: Internal Medicine | Admitting: Internal Medicine

## 2020-10-16 DIAGNOSIS — G309 Alzheimer's disease, unspecified: Secondary | ICD-10-CM | POA: Diagnosis not present

## 2020-10-16 DIAGNOSIS — F0281 Dementia in other diseases classified elsewhere with behavioral disturbance: Secondary | ICD-10-CM | POA: Diagnosis not present

## 2020-10-16 DIAGNOSIS — J9610 Chronic respiratory failure, unspecified whether with hypoxia or hypercapnia: Secondary | ICD-10-CM | POA: Diagnosis not present

## 2020-10-16 DIAGNOSIS — N184 Chronic kidney disease, stage 4 (severe): Secondary | ICD-10-CM | POA: Diagnosis not present

## 2020-10-16 LAB — ECHOCARDIOGRAM COMPLETE
AR max vel: 2.41 cm2
AV Area VTI: 2.45 cm2
AV Area mean vel: 2.04 cm2
AV Mean grad: 3 mmHg
AV Peak grad: 4.2 mmHg
Ao pk vel: 1.03 m/s
Area-P 1/2: 2.59 cm2
Height: 71 in
S' Lateral: 3.26 cm
Weight: 3040 oz

## 2020-10-16 MED ORDER — HYDRALAZINE HCL 25 MG PO TABS: 25.0000 mg | ORAL_TABLET | Freq: Three times a day (TID) | ORAL | Status: DC | PRN

## 2020-10-16 MED ORDER — DM-GUAIFENESIN ER 30-600 MG PO TB12
1.0000 | ORAL_TABLET | Freq: Two times a day (BID) | ORAL | Status: DC
  Administered 2020-10-16 – 2020-10-22 (×11): 1 via ORAL
  Filled 2020-10-16 (×14): qty 1

## 2020-10-16 NOTE — Consult Note (Signed)
**Louis Louis** Louis Louis  Patient ID: Louis Louis, MRN: [id number], DOB/AGE: 1928/10/14 86 y.o. Admit date: 10/15/2020   Date of Consult: 10/16/2020 Primary Physician: [facility] Primary Cardiologist: Paraschos  Chief Complaint:  Chief Complaint  Patient presents with  . Altered Mental Status   Reason for Consult: Diastolic dysfunction congestive heart failure  HPI: 86 y.o. male with known significant dementia for which she currently unable to get a significant history from the patient.  The patient doe does have known chronic kidney disease stage III with a glomerular filtration rate of 33 and known coronary artery disease status post stent placements in the past with Dr. Saralyn Pilar as well as hypertension and hyperlipidemia.  The patient has been on appropriate medication management for these issues but now has had some apparent difficulty with his dementia and not appropriately treating his current conditions.  With this the family has had some difficulty and has brought him to the hospital for concerns of heart failure.  It does appear that the patient has a chest x-ray with bibasilar atelectasis and/or changes possibly consistent with congestive heart failure.  Head CT has shown diffuse atrophy but no acute changes.  The patient does also have a BNP of 1311 and troponin of 70/82/93.  This is most consistent with demand ischemia rather than acute coronary syndrome.  The patient has had an EKG showing sinus bradycardia with poor R wave progression and anterior infarct age undetermined.  There has not been any recent evaluation of the patient by echocardiogram so there is possible consistent diastolic and/or systolic dysfunction congestive heart failure causing above symptoms.  Past Medical History:  Diagnosis Date  . Cancer [facility])    Larynx cancer.    Marland Kitchen COPD (chronic obstructive pulmonary disease) ([place])   . Heart attack ([place]) 1995  .  Hyperlipidemia   . Hypertension   . Renal artery stenosis [facility])       Surgical History:  Past Surgical History:  Procedure Laterality Date  . CHOLECYSTECTOMY    . CORONARY ANGIOPLASTY WITH STENT PLACEMENT       Home Meds: Prior to Admission medications   Medication Sig Start Date End Date Taking? Authorizing Provider  aspirin EC 81 MG tablet Take 81 mg by mouth daily. Swallow whole.   Yes [provider]  atenolol (TENORMIN) 25 MG tablet Take 25 mg by mouth daily. 08/19/19  Yes [provider]  isosorbide mononitrate (IMDUR) 30 MG 24 hr tablet Take 30 mg by mouth daily.   Yes [provider]  levothyroxine (SYNTHROID) 125 MCG tablet Take 125 mcg by mouth daily. 08/19/20  Yes [provider]  lisinopril (PRINIVIL,ZESTRIL) 5 MG tablet Take 5 mg by mouth daily.   Yes [provider]  omeprazole (PRILOSEC) 20 MG capsule Take 20 mg by mouth daily.   Yes [provider]  simvastatin (ZOCOR) 20 MG tablet Take 20 mg by mouth daily.    Yes [provider]  indomethacin (INDOCIN SR) 75 MG CR capsule Take 1 capsule (75 mg total) by mouth 2 (two) times daily with a meal. Patient not taking: No sig reported 01/06/16   Hyatt, Max T, DPM  tamsulosin (FLOMAX) 0.4 MG CAPS capsule Take 0.4 mg by mouth daily. Patient not taking: No sig reported    [provider]    Inpatient Medications:  . aspirin EC  81 mg Oral Daily  . atenolol  25 mg Oral Daily  . azithromycin  500  mg Oral Daily  . budesonide (PULMICORT) nebulizer solution  0.25 mg Nebulization BID  . enoxaparin (LOVENOX) injection  30 mg Subcutaneous Q24H  . furosemide  40 mg Intravenous Q12H  . ipratropium-albuterol  3 mL Nebulization Q6H  . isosorbide mononitrate  30 mg Oral Daily  . levothyroxine  125 mcg Oral Q0600  . lisinopril  5 mg Oral Daily  . pantoprazole  40 mg Oral Daily  . simvastatin  20 mg Oral Daily  . sodium chloride flush  3 mL Intravenous Q12H   .  sodium chloride      Allergies: No Known Allergies  Social History   Socioeconomic History  . Marital status: Widowed    Spouse name: Not on file  . Number of children: Not on file  . Years of education: Not on file  . Highest education level: Not on file  Occupational History  . Not on file  Tobacco Use  . Smoking status: Former Smoker    Packs/day: 1.50    Years: 30.00    Pack years: 45.00    Types: Cigarettes    Quit date: 10/24/1993    Years since quitting: 26.9  . Smokeless tobacco: Never Used  Vaping Use  . Vaping Use: Never used  Substance and Sexual Activity  . Alcohol use: No  . Drug use: No  . Sexual activity: Never    Birth control/protection: Abstinence  Other Topics Concern  . Not on file  Social History Narrative  . Not on file   Social Determinants of Health   Financial Resource Strain: Not on file  Food Insecurity: Not on file  Transportation Needs: Not on file  Physical Activity: Not on file  Stress: Not on file  Social Connections: Not on file  Intimate Partner Violence: Not on file     Family History  Family history unknown: Yes     Review of Systems Cannot assess well due to dementia  Labs: No results for input(s): CKTOTAL, CKMB, TROPONINI in the last 72 hours. Lab Results  Component Value Date   WBC 7.8 10/15/2020   HGB 15.6 10/15/2020   HCT 47.8 10/15/2020   MCV 104.8 (H) 10/15/2020   PLT 110 (L) 10/15/2020    Recent Labs  Lab 10/15/20 1115  NA 143  K 4.4  CL 108  CO2 26  BUN 27*  CREATININE 1.92*  CALCIUM 8.4*  PROT 5.8*  BILITOT 1.5*  ALKPHOS 58  ALT 17  AST 19  GLUCOSE 101*   No results found for: CHOL, HDL, LDLCALC, TRIG No results found for: DDIMER  Radiology/Studies:  CT Head Wo Contrast  Result Date: 10/15/2020 CLINICAL DATA:  Mental status change.  History of dementia. EXAM: CT HEAD WITHOUT CONTRAST TECHNIQUE: Contiguous axial images were obtained from the base of the skull through the vertex without  intravenous contrast. COMPARISON:  CT head 07/31/2014. FINDINGS: Brain: There is no evidence of acute intracranial hemorrhage, mass lesion, brain edema or extra-axial fluid collection. Mildly progressive atrophy with prominence of the ventricles and subarachnoid spaces. There is patchy low-density in the periventricular white matter bilaterally, likely due to chronic small vessel ischemic changes. There is no CT evidence of acute cortical infarction. Vascular:  No hyperdense vessel identified. Skull: Negative for fracture or focal lesion. Sinuses/Orbits: The visualized paranasal sinuses and mastoid air cells are clear. No orbital abnormalities are seen. Other: None. IMPRESSION: 1. No acute intracranial findings. 2. Mildly progressive atrophy and chronic small vessel ischemic changes. Electronically Signed  By: Richardean Sale M.D.   On: 10/15/2020 11:46   DG Chest Port 1 View  Result Date: 10/15/2020 CLINICAL DATA:  Syncopal episodes.  Multiple falls.  Dementia. EXAM: PORTABLE CHEST 1 VIEW COMPARISON:  Radiographs 03/12/2019 and 11/14/2017.  CT 12/20/2017. FINDINGS: 1120 hours. There are significantly lower lung volumes. The heart size and mediastinal contours are stable with aortic atherosclerosis. There are new patchy airspace opacities at both lung bases associated with poor definition of the pulmonary vasculature. No large pleural effusion, confluent airspace opacity or pneumothorax identified. Telemetry leads overlie the chest. No acute osseous findings. IMPRESSION: Lower lung volumes with new patchy bibasilar airspace opacities which could reflect viral infection or edema. Followup PA and lateral views recommended when the patient is able. Electronically Signed   By: Richardean Sale M.D.   On: 10/15/2020 11:44   DG Humerus Left  Result Date: 10/15/2020 CLINICAL DATA:  Frequent falls.  Left arm bruising.  Dementia. EXAM: LEFT HUMERUS - 2+ VIEW COMPARISON:  None. FINDINGS: The mineralization and  alignment are normal. There is no evidence of acute fracture or dislocation. Minor degenerative changes at the shoulder and elbow for age. No foreign body or focal soft tissue swelling identified. IMPRESSION: No acute findings. Minor degenerative changes. Electronically Signed   By: Richardean Sale M.D.   On: 10/15/2020 11:41    EKG: Sinus bradycardia with anterior infarct age undetermined  Weights: Filed Weights   10/15/20 1109  Weight: 86.2 kg     Physical Exam: Blood pressure (!) 117/57, pulse (!) 50, temperature 98.1 F (36.7 C), temperature source Oral, resp. rate 18, height 5\' 11"  (1.803 m), weight 86.2 kg, SpO2 96 %. Body mass index is 26.5 kg/m. General: Well developed, well nourished, in no acute distress. Head eyes ears nose throat: Normocephalic, atraumatic, sclera non-icteric, no xanthomas, nares are without discharge. No apparent thyromegaly and/or mass  Lungs: Normal respiratory effort.  no wheezes, no rales, few rhonchi.  Decreased bibasilar breath sounds Heart: RRR with normal S1 S2. no murmur gallop, no rub, PMI is normal size and placement, carotid upstroke normal without bruit, jugular venous pressure is normal Abdomen: Soft, non-tender, non-distended with normoactive bowel sounds. No hepatomegaly. No rebound/guarding. No obvious abdominal masses. Abdominal aorta is normal size without bruit Extremities: Trace edema. no cyanosis, no clubbing, no ulcers  Peripheral : 2+ bilateral upper extremity pulses, 2+ bilateral femoral pulses, 2+ bilateral dorsal pedal pulse Neuro: Not alert and oriented. No facial asymmetry. No focal deficit. Moves all extremities spontaneously. Musculoskeletal: Normal muscle tone without kyphosis Psych: Does not responds to questions appropriately with a normal affect.    Assessment: 86 year old male with significant dementia having significant change in dementia issues as well as poor treatment of current conditions including coronary artery  disease chronic kidney disease and hypertension having what appears to be either diastolic and/or systolic dysfunction congestive heart failure with lower extremity edema elevated BNP multifactorial in nature without evidence of acute myocardial infarction  Plan: 1.  Intravenous Lasix for 24 to 48 hours at 40 mg twice per day as listed for congestive heart failure 2.  Echocardiogram for further evaluation and treatment options of type of congestive heart failure and adjustments of medication management thereof is necessary 3.  Okay with continuation of atenolol at this time but will watch closely for any worsening bradycardia and need for adjustments 4.  No further intervention of elevated troponin most consistent with demand ischemia without current evidence of acute coronary syndrome 5.  Okay  for treatment of other cardiovascular risk factors including hypertension with lisinopril at current dose, isosorbide for any potential chest discomfort, and high intensity cholesterol therapy with simvastatin 6.  Begin ambulation and follow-up for improvements of symptoms and adjustments as necessary  Signed, Corey Skains M.D. [facility] 10/16/2020, 8:16 AM

## 2020-10-16 NOTE — Progress Notes (Signed)
*  PRELIMINARY RESULTS* Echocardiogram 2D Echocardiogram has been performed.  Louis Harrison 10/16/2020, 11:55 AM

## 2020-10-16 NOTE — TOC Progression Note (Signed)
Transition of Care [facility]) - Progression Note    Patient Details  Name: TYNELL WINCHELL MRN: [id number] Date of Birth: March 25, 1928  Transition of Care [facility]) CM/SW [place], [place] Phone Number: [phone] 10/16/2020, 1:48 PM  Clinical Narrative:     Pending PT/OT recommendations for SNF placement, Noone,Alexius K (Son)  [phone] main contact.  Patient lives alone and Mr. Wurtz feels it is unsafe for the patient to return home.   Expected Discharge Plan: [place] Barriers to Discharge: Continued Medical Work up  Expected Discharge Plan and Services Expected Discharge Plan: [place] In-house Referral: Clinical Social Work Discharge Planning Services: CM Consult Post Acute Care Choice: [place] arrangements for the past 2 months: Single Family Home                                       Social Determinants of Health (SDOH) Interventions    Readmission Risk Interventions No flowsheet data found.

## 2020-10-16 NOTE — Evaluation (Signed)
Physical Therapy Evaluation Patient Details Name: Louis Harrison MRN: [id number] DOB: 07/29/1928 Today's Date: 10/16/2020   History of Present Illness  Pt is a 86 y/o M who was admitted on 10/15/20 after presenting to ED for evaluation of AMS which son describes as visual hallucinations & agitation. PMH: dementia, COPD with chronic respiratory failure, chronic diastolic dysfunction CHF, HTN, larynx CA, heart attack (1995), hyperlipidemia, renal artery stenosis, CKD stage 3  Clinical Impression  Pt seen immediately following OT evaluation. Pt currently requires min guard for sit<>stand transfers and min guard<>close supervision for gait with RW. Pt requires max cuing for proper hand placement for safety during transfers, as well as proper use of AD during gait. Pt currently requires supplemental oxygen via nasal cannula during mobility but still desaturates - unsure if pulse ox is getting accurate reading (see below for details). Pt reports he cannot see well & doesn't wear his glasses all the time as he is supposed to & requires assistance with meal tray set up at end of session.   Due to pt's impaired cognition & mobility, as well as need to use supplemental oxygen at home, pt will require 24 hr supervision & HHPT f/u at d/c. If caregivers are not able to provide this pt would benefit from STR to maximize safety with mobility, improve balance & strength, & reduce fall risk prior to d/c home. Will continue to follow acutely to safely progress pt with mobility.     Follow Up Recommendations Home health PT;Supervision/Assistance - 24 hour;Supervision for mobility/OOB    Equipment Recommendations  Rolling walker with 5" wheels    Recommendations for Other Services       Precautions / Restrictions Precautions Precautions: Fall Precaution Comments: delirium pcns Restrictions Weight Bearing Restrictions: No      Mobility  Bed Mobility Overal bed mobility: Needs Assistance Bed Mobility:  Sit to Supine       Sit to supine: Supervision;HOB elevated   General bed mobility comments: extra time/momentum to transfer BLE onto bed    Transfers Overall transfer level: Needs assistance   Transfers: Sit to/from Stand Sit to Stand: Min guard;Min assist         General transfer comment: pt performs multiple sit<>stand transfers during session (from EOB & recliner with armrests); pt with increased ease when provided with armrests but requires multiple attempts for each sit<>stand with pt achieving transfer on 2nd attempt each time. Very poor safety awareness re: safe hand placement for stand>sit as pt will instead "plop" on EOB.  Ambulation/Gait Ambulation/Gait assistance: Supervision;Min guard Gait Distance (Feet): 100 Feet (+ 100 ft) Assistive device: Rolling walker (2 wheeled) Gait Pattern/deviations: Step-through pattern;Decreased stride length     General Gait Details: Max cuing but only fair return demo to ambulate within base of AD; PT adjusts height of RW after 1st gait trial with improved ability to ambulate within base of AD on 2nd trial  Stairs            Wheelchair Mobility    Modified Rankin (Stroke Patients Only)       Balance Overall balance assessment: Needs assistance         Standing balance support: Bilateral upper extremity supported Standing balance-Leahy Scale: Poor Standing balance comment: BUE support on RW during gait                             Pertinent Vitals/Pain Pain Assessment: No/denies pain  Home Living Family/patient expects to be discharged to:: Private residence Living Arrangements: Alone Available Help at Discharge: Family;Available PRN/intermittently Type of Home: House Home Access: Stairs to enter Entrance Stairs-Rails: Right Entrance Stairs-Number of Steps: 4   Home Equipment: Cane - single point   Pt expects to d/c home, pt's son would like pt to d/c to SNF.      Prior Function Level of  Independence:  (Pt lives alone, uses cane for mobility. Son checks on pt 1x/day in evenings & provides meals for lunch/supper but pt prepares breakfast.)         Comments: driving     Hand Dominance        Extremity/Trunk Assessment   Upper Extremity Assessment Upper Extremity Assessment: Generalized weakness (bulbous fingers/joints)    Lower Extremity Assessment Lower Extremity Assessment: Generalized weakness    Cervical / Trunk Assessment Cervical / Trunk Assessment: Kyphotic  Communication   Communication: HOH  Cognition Arousal/Alertness: Awake/alert Behavior During Therapy: WFL for tasks assessed/performed (eager to d/c home) Overall Cognitive Status: No family/caregiver present to determine baseline cognitive functioning (pt with hx of dementia)                                 General Comments: Pt AxO to situation & time only, unable to select location from choice of 2      General Comments General comments (skin integrity, edema, etc.): Pt initially on 4L/min supplemental oxygen via nasal cannula but titrated up to 6L/min with mobility & left on 4L/min at end of session. Pt's SpO2 would drop to 67-71% with mobility but pt denies worsening SOB & pt without any obvious signs of distress. Pt able to take deep breaths & PT educates pt on pursed lip breathing & SpO2 quickly returns to WNL. Unsure if pulse ox is getting accurate reading of SpO2 - Nurse made aware.    Exercises     Assessment/Plan    PT Assessment Patient needs continued PT services  PT Problem List Decreased strength;Decreased balance;Decreased cognition;Decreased knowledge of precautions;Decreased knowledge of use of DME;Cardiopulmonary status limiting activity;Decreased mobility;Decreased activity tolerance;Decreased coordination;Decreased safety awareness       PT Treatment Interventions DME instruction;Functional mobility training;Balance training;Patient/family education;Gait  training;Therapeutic activities;Neuromuscular re-education;Therapeutic exercise;Cognitive remediation;Manual techniques;Stair training    PT Goals (Current goals can be found in the Care Plan section)  Acute Rehab PT Goals Patient Stated Goal: go home PT Goal Formulation: With patient Time For Goal Achievement: 11/13/2020 Potential to Achieve Goals: Good    Frequency Min 2X/week   Barriers to discharge Decreased caregiver support lives alone    Co-evaluation               AM-PAC PT "6 Clicks" Mobility  Outcome Measure Help needed turning from your back to your side while in a flat bed without using bedrails?: None Help needed moving from lying on your back to sitting on the side of a flat bed without using bedrails?: A Little Help needed moving to and from a bed to a chair (including a wheelchair)?: A Little Help needed standing up from a chair using your arms (e.g., wheelchair or bedside chair)?: A Little Help needed to walk in hospital room?: A Little Help needed climbing 3-5 steps with a railing? : A Lot 6 Click Score: 18    End of Session Equipment Utilized During Treatment: Gait belt Activity Tolerance: Patient tolerated treatment well Patient left: in  bed;with bed alarm set;with call bell/phone within reach (set up with meal tray) Nurse Communication: Mobility status PT Visit Diagnosis: Unsteadiness on feet (R26.81);Muscle weakness (generalized) (M62.81);Difficulty in walking, not elsewhere classified (R26.2)    Time: 5996-8957 PT Time Calculation (min) (ACUTE ONLY): 22 min   Charges:   PT Evaluation $PT Eval Low Complexity: 1 Low PT Treatments $Therapeutic Activity: 8-22 mins        Lavone Nian, PT, DPT 10/16/20, 2:23 PM   Waunita Schooner 10/16/2020, 2:18 PM

## 2020-10-16 NOTE — ED Notes (Signed)
Only able to obtain o2 sat and pulse from spo2 monitor at this time

## 2020-10-16 NOTE — Evaluation (Signed)
Occupational Therapy Evaluation Patient Details Name: Louis Harrison MRN: [id number] DOB: [birth year] Today's Date: 10/16/2020    History of Present Illness Pt is a 86 y/o M who was admitted on 10/15/20 after presenting to ED for evaluation of AMS which son describes as visual hallucinations & agitation. PMH: dementia, COPD with chronic respiratory failure, chronic diastolic dysfunction CHF, HTN, larynx CA, heart attack (1995), hyperlipidemia, renal artery stenosis, CKD stage 3   Clinical Impression   Louis Harrison was seen for OT evaluation this date. Prior to hospital admission, pt was MOD I for mobility using SPC and 2L O2, reports not using either regularly. Pt lives alone, son delivers meals in evenings. Pt presents to acute OT demonstrating impaired ADL performance and functional mobility 2/2 decreased activity tolerance, functional strength/balance deficits, and poor insight into deficits.   Pt currently requires MOD A toileting using urinal in standing - needs BUE support. SETUP for meals at bed level. MIN A + RW for ADL t/f - VCs t/o for safety, pt unable to manage lines/leads. Pt would benefit from skilled OT to address noted impairments and functional limitations (see below for any additional details) in order to maximize safety and independence while minimizing falls risk and caregiver burden. Upon hospital discharge, recommend STR to maximize pt safety and return to PLOF.     Follow Up Recommendations  SNF    Equipment Recommendations  Tub/shower seat    Recommendations for Other Services       Precautions / Restrictions Precautions Precautions: Fall Precaution Comments: delirium pcns Restrictions Weight Bearing Restrictions: No      Mobility Bed Mobility Overal bed mobility: Needs Assistance Bed Mobility: Supine to Sit;Sit to Supine     Supine to sit: Supervision Sit to supine: Supervision   General bed mobility comments: extra time/momentum to transfer BLE  onto bed    Transfers Overall transfer level: Needs assistance Equipment used: Rolling walker (2 wheeled) Transfers: Sit to/from Stand Sit to Stand: Min assist         General transfer comment: pt performs multiple sit<>stand transfers during session (from EOB & recliner with armrests); pt with increased ease when provided with armrests but requires multiple attempts for each sit<>stand with pt achieving transfer on 2nd attempt each time. Very poor safety awareness re: safe hand placement for stand>sit as pt will instead "plop" on EOB.    Balance Overall balance assessment: Needs assistance Sitting-balance support: No upper extremity supported;Feet supported Sitting balance-Leahy Scale: Fair     Standing balance support: Bilateral upper extremity supported Standing balance-Leahy Scale: Poor Standing balance comment: BUE support on RW standing toileting                           ADL either performed or assessed with clinical judgement   ADL Overall ADL's : Needs assistance/impaired                                       General ADL Comments: MOD A toileting using urinal in standing - needs BUE support. SETUP for meals at bed level. MIN A + RW for ADL t/f                  Pertinent Vitals/Pain Pain Assessment: No/denies pain     Hand Dominance     Extremity/Trunk Assessment Upper Extremity Assessment Upper Extremity Assessment: Generalized weakness  Lower Extremity Assessment Lower Extremity Assessment: Generalized weakness   Cervical / Trunk Assessment Cervical / Trunk Assessment: Kyphotic   Communication Communication Communication: HOH   Cognition Arousal/Alertness: Awake/alert Behavior During Therapy: WFL for tasks assessed/performed Overall Cognitive Status: History of cognitive impairments - at baseline                                 General Comments: Pt AxO to situation & time only, unable to select location  from choice of 2   General Comments  Pt initially on 4L/min supplemental oxygen via nasal cannula but titrated up to 6L/min with mobility & left on 4L/min at end of session. Pt's SpO2 would drop to 67-71% with mobility but pt denies worsening SOB & pt without any obvious signs of distress. Pt able to take deep breaths & PT educates pt on pursed lip breathing & SpO2 quickly returns to WNL. Unsure if pulse ox is getting accurate reading of SpO2 - Nurse made aware.    Exercises Exercises: Other exercises Other Exercises Other Exercises: Pt and family educated re: OT role, DME recs, d/c recs, falls prevention, ECS, safe t/f techniques Other Exercises: LBD, toileting, sup<>sit, sit<>stand x3, sitting/standing balance/tolerance   Shoulder Instructions      Home Living Family/patient expects to be discharged to:: Private residence Living Arrangements: Alone Available Help at Discharge: Family;Available PRN/intermittently Type of Home: House Home Access: Stairs to enter [employer] of Steps: 4 Entrance Stairs-Rails: Right Home Layout: One level     Bathroom Shower/Tub: Walk-in shower         Home Equipment: Cane - single point          Prior Functioning/Environment Level of Independence: Independent with assistive device(s)        Comments: Son checks on pt 1x/day in evenings & provides meals for lunch/supper but pt prepares breakfast.        OT Problem List: Decreased strength;Decreased activity tolerance;Impaired balance (sitting and/or standing);Decreased safety awareness;Decreased knowledge of use of DME or AE;Cardiopulmonary status limiting activity      OT Treatment/Interventions: Self-care/ADL training;Therapeutic exercise;Energy conservation;DME and/or AE instruction;Therapeutic activities;Patient/family education;Balance training    OT Goals(Current goals can be found in the care plan section) Acute Rehab OT Goals Patient Stated Goal: go home OT Goal  Formulation: With patient Time For Goal Achievement: 11/01/2020 Potential to Achieve Goals: Good ADL Goals Pt Will Perform Grooming: with modified independence;standing (c LRAD PRN) Pt Will Perform Lower Body Dressing: with modified independence;sit to/from stand (c LRAD PRN) Pt Will Transfer to Toilet: with modified independence;ambulating;regular height toilet (c LRAD PRN)  OT Frequency: Min 1X/week   Barriers to D/C: Decreased caregiver support;Inaccessible home environment             AM-PAC OT "6 Clicks" Daily Activity     Outcome Measure Help from another person eating meals?: A Little Help from another person taking care of personal grooming?: A Little Help from another person toileting, which includes using toliet, bedpan, or urinal?: A Lot Help from another person bathing (including washing, rinsing, drying)?: A Little Help from another person to put on and taking off regular upper body clothing?: A Little Help from another person to put on and taking off regular lower body clothing?: A Little 6 Click Score: 17   End of Session Equipment Utilized During Treatment: Rolling walker Nurse Communication: Mobility status  Activity Tolerance: Patient tolerated treatment well Patient left: in  bed;Other (comment) (c PT in room)  OT Visit Diagnosis: History of falling (Z91.81);Other abnormalities of gait and mobility (R26.89)                Time: 1962-2297 OT Time Calculation (min): 25 min Charges:  OT General Charges $OT Visit: 1 Visit OT Evaluation $OT Eval Low Complexity: 1 Low OT Treatments $Self Care/Home Management : 23-37 mins  Dessie Coma, M.S. OTR/L  10/16/20, 2:54 PM  ascom [phone]

## 2020-10-16 NOTE — Hospital Course (Addendum)
Louis Harrison is a 86 y.o. male with medical history significant for dementia who lives alone at home, COPD with chronic respiratory failure on 2 L/min oxygen, chronic diastolic dysfunction CHF and HTN presented by EMS for evaluation of mental status changes.  Son reported dementia seems to be recently worsening over the past few weeks, with worsening visual hallucinations and agitations, refusing care, more difficult to reorient.  Concerns for safety at home.  Patient also had progressive shortness of breath on exertion, productive cough.  No fevers or chills.  Evaluation in ED was consistent with decompensated heart failure with elevated BNP 1311, normal lactate, no leukocytosis, negative procalcitonin and CXR with bibasilar airspace opacities concerning for infection or edema.    Admitted to hospitalist service with cardiology consulted.  Initially diuresed with IV Lasix with caution given CKD.  Pt's respiratory status and LE edema improved.  He was also treated for mild COPD exacerbation on admission but systemic steroids were deferred due to severe agitation and combativeness, and only had minimal wheezing.  Patient has had significant behavioral disturbances since admission, very combative and difficult to manage.  Psychiatry was consulted while patient in the ED and recommended as needed IV Haldol.  Scheduled Seroquel BID has since been started and adjusting dose for control of behaviors while minimizing sedation.  Has not required PRN medication for agitation.  Have reduced AM dose of Haldol to 25 mg.    Palliative care is consulted and discussions are ongoing with patient's two sons regarding goals of care.

## 2020-10-16 NOTE — Progress Notes (Signed)
PROGRESS NOTE    Louis Harrison   TMA:[id number]  DOB: 20-Aug-1928  PCP: [facility] [place]    DOA: 10/15/2020 LOS: 1   Brief Narrative   Louis Harrison is a 86 y.o. male with medical history significant for dementia, COPD with chronic respiratory failure, chronic diastolic dysfunction CHF and hypertension who was brought into the ER by EMS for evaluation of mental status changes.  Patient's son provided history that patient's dementia seems to be recently worsening over the past few weeks, with worsening visual hallucinations and agitations, refusing care, more difficult to reorient.  Patient lives alone son concerned this may no longer be safe.  In addition, son had noted progressive shortness of breath with exertion and cough which had become productive of yellow sputum.  Evaluation in the ED revealed elevated BNP of 1311, mild troponin elevation 65 >> 70, normal lactic acid, no leukocytosis, thrombocytopenia with platelets 110k..  Chest x-ray showed bibasilar airspace opacities concerning for infection or edema.  X-ray of left humerus was negative.  Admitted to hospitalist service with cardiology consulted.  Undergoing diuresis for acute on chronic diastolic CHF undergoing IV diuresis and being treated for mild acute exacerbation of COPD with relatively stable chronic respiratory failure (uses 2 L/min at baseline).   Patient had significant behavioral disturbances since admission, very combative and difficult to manage.  Psychiatry was consulted and has ordered as needed IV Haldol.     Assessment & Plan   Principal Problem:   Dementia ([place]) Active Problems:   Chronic respiratory failure (HCC)   COPD (chronic obstructive pulmonary disease) (HCC)   Hypertension   Coronary artery disease   Acute CHF (congestive heart failure) (HCC)   CKD (chronic kidney disease), stage IV (HCC)   Hypothyroidism   Acute on chronic diastolic CHF -present on admission with  dyspnea on exertion, elevated BNP and x-ray findings with pulmonary edema.  Cardiology is consulted.  Continue IV Lasix 40 mg twice daily.  Follow-up echocardiogram.  Continue atenolol.  Strict INO's and daily weights.  Low-sodium diet with fluid restriction.  BP control as below  Dementia with behavioral disturbances -Per son, patient lives alone and has had more difficulties with worsening dementia including hallucinations, combativeness, refusal of care, taking off oxygen.  He has also had frequent falls and unsafe to remain home alone without 24/7 supervision. --Psychiatry was consulted --IV Haldol as needed --TOC consulted for placement --PT and OT evaluations  COPD with acute exacerbation / Chronic respiratory failure -at baseline patient uses 2 L/min nasal cannula oxygen.  This is fairly stable patient on 2.5 L/min this morning.  Continue scheduled and as needed nebs per orders, inhaled corticosteroid.  Zithromax.  Will defer systemic steroids for now given agitation and combativeness.  Patient not wheezing on exam today.  Elevated troponin -mild and flat trend without any active chest pain.  This likely secondary to demand ischemia in the setting of decompensated CHF.  Hypertension -chronic, uncontrolled on admission.  Continue lisinopril.  As needed hydralazine if uncontrolled.  History of CAD -stable with no active chest pain.  Continue lisinopril, Imdur, aspirin and simvastatin  Hypothyroidism -continue Synthroid.  Check TSH. DVT prophylaxis: enoxaparin (LOVENOX) injection 30 mg Start: 10/15/20 1600   Diet:  Diet Orders (From admission, onward)    Start     Ordered   10/15/20 1545  Diet 2 gram sodium Room service appropriate? Yes; Fluid consistency: Thin  Diet effective now       Question Answer  Comment  Room service appropriate? Yes   Fluid consistency: Thin      10/15/20 1545            Code Status: DNR    Subjective 10/16/20    Patient seen today in ED while  holding for a bed.  Reports from nursing staff of severe agitation and combativeness.  During my encounter, patient calm and appropriate and answers questions reliably.  He denies chest pain or shortness of breath.  Does endorse a productive cough with some difficulty producing phlegm.  He otherwise says he feels like usual.   Disposition Plan & Communication   Status is: Inpatient  Remains inpatient appropriate because:IV treatments appropriate due to intensity of illness or inability to take PO   Dispo: The patient is from: Home              Anticipated d/c is to: SNF              Anticipated d/c date is: 2 days              Patient currently is not medically stable to d/c.   Family Communication: called patient's son by phone this afternoon   Consults, Procedures, Significant Events   Consultants:   Cardiology  Procedures:   Echo EF 45-50%  Antimicrobials:  Anti-infectives (From admission, onward)   Start     Dose/Rate Route Frequency Ordered Stop   10/15/20 1600  azithromycin (ZITHROMAX) tablet 500 mg        500 mg Oral Daily 10/15/20 1554 10/20/20 0959         Objective   Vitals:   10/16/20 0949 10/16/20 1130 10/16/20 1230 10/16/20 1315  BP: (!) 158/86 (!) 147/83 (!) 146/73   Pulse: (!) 58 (!) 54 (!) 59 (!) 56  Resp: 18 19 17 18   Temp:      TempSrc:      SpO2: 90% 93% 91% 96%  Weight:      Height:        Intake/Output Summary (Last 24 hours) at 10/16/2020 1450 Last data filed at 10/16/2020 1359 Gross per 24 hour  Intake --  Output 400 ml  Net -400 ml   Filed Weights   10/15/20 1109  Weight: 86.2 kg    Physical Exam:  General exam: awake, alert, no acute distress HEENT: moist mucus membranes, hearing grossly normal  Respiratory system: decreased breath sounds, no wheezes, normal respiratory effort, on 2.5 L/min oxygen. Cardiovascular system: normal S1/S2, RRR, 1+ lower extremity pitting edema.   Gastrointestinal system: soft, NT, ND, +bowel  sounds. Central nervous system: alert, oriented to self, no gross focal neurologic deficits, normal speech Psychiatry: normal mood, congruent affect, judgement and insight appear normal  Labs   Data Reviewed: I have personally reviewed following labs and imaging studies  CBC: Recent Labs  Lab 10/15/20 1343  WBC 7.8  NEUTROABS 6.2  HGB 15.6  HCT 47.8  MCV 104.8*  PLT 315*   Basic Metabolic Panel: Recent Labs  Lab 10/15/20 1115  NA 143  K 4.4  CL 108  CO2 26  GLUCOSE 101*  BUN 27*  CREATININE 1.92*  CALCIUM 8.4*   GFR: Estimated Creatinine Clearance: 27.2 mL/min (A) (by C-G formula based on SCr of 1.92 mg/dL (H)). Liver Function Tests: Recent Labs  Lab 10/15/20 1115  AST 19  ALT 17  ALKPHOS 58  BILITOT 1.5*  PROT 5.8*  ALBUMIN 3.5   Recent Labs  Lab 10/15/20 1115  LIPASE 19   No results for input(s): AMMONIA in the last 168 hours. Coagulation Profile: Recent Labs  Lab 10/15/20 1115  INR 1.1   Cardiac Enzymes: No results for input(s): CKTOTAL, CKMB, CKMBINDEX, TROPONINI in the last 168 hours. BNP (last 3 results) No results for input(s): PROBNP in the last 8760 hours. HbA1C: No results for input(s): HGBA1C in the last 72 hours. CBG: No results for input(s): GLUCAP in the last 168 hours. Lipid Profile: No results for input(s): CHOL, HDL, LDLCALC, TRIG, CHOLHDL, LDLDIRECT in the last 72 hours. Thyroid Function Tests: No results for input(s): TSH, T4TOTAL, FREET4, T3FREE, THYROIDAB in the last 72 hours. Anemia Panel: No results for input(s): VITAMINB12, FOLATE, FERRITIN, TIBC, IRON, RETICCTPCT in the last 72 hours. Sepsis Labs: Recent Labs  Lab 10/15/20 1115 10/15/20 1343  LATICACIDVEN 1.6 1.4    Recent Results (from the past 240 hour(s))  Resp Panel by RT-PCR (Flu A&B, Covid) Nasopharyngeal Swab     Status: None   Collection Time: 10/15/20 11:38 AM   Specimen: Nasopharyngeal Swab; Nasopharyngeal(NP) swabs in vial transport medium  Result  Value Ref Range Status   SARS Coronavirus 2 by RT PCR NEGATIVE NEGATIVE Final    Comment: (NOTE) SARS-CoV-2 target nucleic acids are NOT DETECTED.  The SARS-CoV-2 RNA is generally detectable in upper respiratory specimens during the acute phase of infection. The lowest concentration of SARS-CoV-2 viral copies this assay can detect is 138 copies/mL. A negative result does not preclude SARS-Cov-2 infection and should not be used as the sole basis for treatment or other patient management decisions. A negative result may occur with  improper specimen collection/handling, submission of specimen other than nasopharyngeal swab, presence of viral mutation(s) within the areas targeted by this assay, and inadequate number of viral copies(<138 copies/mL). A negative result must be combined with clinical observations, patient history, and epidemiological information. The expected result is Negative.  Fact Sheet for Patients:  EntrepreneurPulse.com.au  Fact Sheet for Healthcare Providers:  IncredibleEmployment.be  This test is no t yet approved or cleared by the [place] FDA and  has been authorized for detection and/or diagnosis of SARS-CoV-2 by FDA under an Emergency Use Authorization (EUA). This EUA will remain  in effect (meaning this test can be used) for the duration of the COVID-19 declaration under Section 564(b)(1) of the Act, 21 U.S.C.section 360bbb-3(b)(1), unless the authorization is terminated  or revoked sooner.       Influenza A by PCR NEGATIVE NEGATIVE Final   Influenza B by PCR NEGATIVE NEGATIVE Final    Comment: (NOTE) The Xpert Xpress SARS-CoV-2/FLU/RSV plus assay is intended as an aid in the diagnosis of influenza from Nasopharyngeal swab specimens and should not be used as a sole basis for treatment. Nasal washings and aspirates are unacceptable for Xpert Xpress SARS-CoV-2/FLU/RSV testing.  Fact Sheet for  Patients: EntrepreneurPulse.com.au  Fact Sheet for Healthcare Providers: IncredibleEmployment.be  This test is not yet approved or cleared by the [place] FDA and has been authorized for detection and/or diagnosis of SARS-CoV-2 by FDA under an Emergency Use Authorization (EUA). This EUA will remain in effect (meaning this test can be used) for the duration of the COVID-19 declaration under Section 564(b)(1) of the Act, 21 U.S.C. section 360bbb-3(b)(1), unless the authorization is terminated or revoked.  Performed at [facility], [address], [place], [place] [address]       Imaging Studies   CT Head Wo Contrast  Result Date: 10/15/2020 CLINICAL DATA:  Mental status  change.  History of dementia. EXAM: CT HEAD WITHOUT CONTRAST TECHNIQUE: Contiguous axial images were obtained from the base of the skull through the vertex without intravenous contrast. COMPARISON:  CT head 07/31/2014. FINDINGS: Brain: There is no evidence of acute intracranial hemorrhage, mass lesion, brain edema or extra-axial fluid collection. Mildly progressive atrophy with prominence of the ventricles and subarachnoid spaces. There is patchy low-density in the periventricular white matter bilaterally, likely due to chronic small vessel ischemic changes. There is no CT evidence of acute cortical infarction. Vascular:  No hyperdense vessel identified. Skull: Negative for fracture or focal lesion. Sinuses/Orbits: The visualized paranasal sinuses and mastoid air cells are clear. No orbital abnormalities are seen. Other: None. IMPRESSION: 1. No acute intracranial findings. 2. Mildly progressive atrophy and chronic small vessel ischemic changes. Electronically Signed   By: Richardean Sale M.D.   On: 10/15/2020 11:46   DG Chest Port 1 View  Result Date: 10/15/2020 CLINICAL DATA:  Syncopal episodes.  Multiple falls.  Dementia. EXAM: PORTABLE CHEST 1 VIEW COMPARISON:   Radiographs 03/12/2019 and 11/14/2017.  CT 12/20/2017. FINDINGS: 1120 hours. There are significantly lower lung volumes. The heart size and mediastinal contours are stable with aortic atherosclerosis. There are new patchy airspace opacities at both lung bases associated with poor definition of the pulmonary vasculature. No large pleural effusion, confluent airspace opacity or pneumothorax identified. Telemetry leads overlie the chest. No acute osseous findings. IMPRESSION: Lower lung volumes with new patchy bibasilar airspace opacities which could reflect viral infection or edema. Followup PA and lateral views recommended when the patient is able. Electronically Signed   By: Richardean Sale M.D.   On: 10/15/2020 11:44   DG Humerus Left  Result Date: 10/15/2020 CLINICAL DATA:  Frequent falls.  Left arm bruising.  Dementia. EXAM: LEFT HUMERUS - 2+ VIEW COMPARISON:  None. FINDINGS: The mineralization and alignment are normal. There is no evidence of acute fracture or dislocation. Minor degenerative changes at the shoulder and elbow for age. No foreign body or focal soft tissue swelling identified. IMPRESSION: No acute findings. Minor degenerative changes. Electronically Signed   By: Richardean Sale M.D.   On: 10/15/2020 11:41   ECHOCARDIOGRAM COMPLETE  Result Date: 10/16/2020    ECHOCARDIOGRAM REPORT   Patient Name:   Louis Harrison Date of Exam: 10/16/2020 Medical Rec #:  [id number]          Height:       71.0 in Accession #:    [id number]         Weight:       190.0 lb Date of Birth:  [birth year]           BSA:          2.063 m Patient Age:    51 years           BP:           158/86 mmHg Patient Gender: M                  HR:           57 bpm. Exam Location:  [facility] Procedure: 2D Echo, Color Doppler and Cardiac Doppler Indications:     I50.31 CHF-Acute Diastolic  History:         Patient has no prior history of Echocardiogram examinations.                  Previous Myocardial Infarction, COPD; Risk  Factors:Hypertension  and Dyslipidemia.  Sonographer:     Charmayne Sheer RDCS (AE) Referring Phys:  [id number] Collier Bullock Diagnosing Phys: Serafina Royals MD  Sonographer Comments: No subcostal window. Image acquisition challenging due to COPD and Image acquisition challenging due to respiratory motion. IMPRESSIONS  1. Left ventricular ejection fraction, by estimation, is 45 to 50%. The left ventricle has mildly decreased function. The left ventricle demonstrates regional wall motion abnormalities (see scoring diagram/findings for description). Left ventricular diastolic parameters were normal.  2. Right ventricular systolic function is normal. The right ventricular size is normal.  3. Left atrial size was mildly dilated.  4. The mitral valve is normal in structure. Mild mitral valve regurgitation.  5. The aortic valve is normal in structure. Aortic valve regurgitation is not visualized. FINDINGS  Left Ventricle: Left ventricular ejection fraction, by estimation, is 45 to 50%. The left ventricle has mildly decreased function. The left ventricle demonstrates regional wall motion abnormalities. Mild hypokinesis of the left ventricular, apical apical segment. The left ventricular internal cavity size was normal in size. There is no left ventricular hypertrophy. Left ventricular diastolic parameters were normal. Right Ventricle: The right ventricular size is normal. No increase in right ventricular wall thickness. Right ventricular systolic function is normal. Left Atrium: Left atrial size was mildly dilated. Right Atrium: Right atrial size was normal in size. Pericardium: There is no evidence of pericardial effusion. Mitral Valve: The mitral valve is normal in structure. Mild mitral valve regurgitation. MV peak gradient, 1.2 mmHg. The mean mitral valve gradient is 0.0 mmHg. Tricuspid Valve: The tricuspid valve is normal in structure. Tricuspid valve regurgitation is mild. Aortic Valve: The aortic valve is  normal in structure. Aortic valve regurgitation is not visualized. Aortic valve mean gradient measures 3.0 mmHg. Aortic valve peak gradient measures 4.2 mmHg. Aortic valve area, by VTI measures 2.45 cm. Pulmonic Valve: The pulmonic valve was normal in structure. Pulmonic valve regurgitation is trivial. Aorta: The aortic root and ascending aorta are structurally normal, with no evidence of dilitation. IAS/Shunts: No atrial level shunt detected by color flow Doppler.  LEFT VENTRICLE PLAX 2D LVIDd:         4.96 cm  Diastology LVIDs:         3.26 cm  LV e' lateral:   6.09 cm/s LV PW:         0.97 cm  LV E/e' lateral: 7.8 LV IVS:        0.89 cm LVOT diam:     2.10 cm LV SV:         49 LV SV Index:   24 LVOT Area:     3.46 cm  RIGHT VENTRICLE RV Basal diam:  3.48 cm LEFT ATRIUM             Index       RIGHT ATRIUM           Index LA diam:        4.20 cm 2.04 cm/m  RA Area:     14.70 cm LA Vol (A2C):   66.4 ml 32.18 ml/m RA Volume:   32.80 ml  15.90 ml/m LA Vol (A4C):   43.8 ml 21.23 ml/m LA Biplane Vol: 54.7 ml 26.51 ml/m  AORTIC VALVE                   PULMONIC VALVE AV Area (Vmax):    2.41 cm    PV Vmax:       0.89 m/s AV Area (Vmean):  2.04 cm    PV Vmean:      59.200 cm/s AV Area (VTI):     2.45 cm    PV VTI:        0.163 m AV Vmax:           103.00 cm/s PV Peak grad:  3.2 mmHg AV Vmean:          76.900 cm/s PV Mean grad:  2.0 mmHg AV VTI:            0.201 m AV Peak Grad:      4.2 mmHg AV Mean Grad:      3.0 mmHg LVOT Vmax:         71.80 cm/s LVOT Vmean:        45.200 cm/s LVOT VTI:          0.142 m LVOT/AV VTI ratio: 0.71  AORTA Ao Root diam: 3.50 cm MITRAL VALVE MV Area (PHT): 2.59 cm    SHUNTS MV Peak grad:  1.2 mmHg    Systemic VTI:  0.14 m MV Mean grad:  0.0 mmHg    Systemic Diam: 2.10 cm MV Vmax:       0.55 m/s MV Vmean:      33.6 cm/s MV Decel Time: 293 msec MV E velocity: 47.60 cm/s MV A velocity: 39.80 cm/s MV E/A ratio:  1.20 Serafina Royals MD Electronically signed by Serafina Royals MD Signature  Date/Time: 10/16/2020/2:47:53 PM    Final      Medications   Scheduled Meds: . aspirin EC  81 mg Oral Daily  . atenolol  25 mg Oral Daily  . azithromycin  500 mg Oral Daily  . budesonide (PULMICORT) nebulizer solution  0.25 mg Nebulization BID  . dextromethorphan-guaiFENesin  1 tablet Oral BID  . enoxaparin (LOVENOX) injection  30 mg Subcutaneous Q24H  . furosemide  40 mg Intravenous Q12H  . ipratropium-albuterol  3 mL Nebulization Q6H  . isosorbide mononitrate  30 mg Oral Daily  . levothyroxine  125 mcg Oral Q0600  . lisinopril  5 mg Oral Daily  . pantoprazole  40 mg Oral Daily  . simvastatin  20 mg Oral Daily  . sodium chloride flush  3 mL Intravenous Q12H   Continuous Infusions: . sodium chloride         LOS: 1 day    Time spent: 30 minutes with greater than 50% spent in coordination of care and direct patient contact.    Ezekiel Slocumb, DO [facility]  10/16/2020, 2:50 PM    If 7PM-7AM, please contact night-coverage. How to contact the [facility] Attending or Consulting provider [place] or covering provider during after hours [place], for this patient?    1. Check the care team in [facility] and look for a) attending/consulting [facility] provider listed and b) the [facility] team listed 2. Log into [email or web address] and use [place] universal password to access. If you do not have the password, please contact the hospital operator. 3. Locate the [facility] provider you are looking for under [facility] and page to a number that you can be directly reached. 4. If you still have difficulty reaching the provider, please page the [facility] (Director on Call) for the Hospitalists listed on amion for assistance.

## 2020-10-16 NOTE — ED Notes (Signed)
Pt asleep in bed at thsi time, [place] on pt and spo2 monitor

## 2020-10-16 NOTE — ED Notes (Signed)
Pt incontinent of urine. Bed linen changed and clean gown placed on pt. Pt was repositioned in bed. Meal tray set up for pt by son. Call light within reach . Pt has no further needs at this time.

## 2020-10-16 NOTE — ED Notes (Signed)
Pt finished working with OT. Pt provided with meal tray.

## 2020-10-16 NOTE — ED Notes (Signed)
Pts bed alarm alarming, pt found sitting on edge of bed saying he had to pee. Pt assisted with urinal and returned to bed, [place] applied to pt, pt resting

## 2020-10-16 NOTE — ED Notes (Signed)
Only able to apply spo2 monitor to pts finger due to combativeness of pt. Full set of vitals unable to be obtained at thsi time

## 2020-10-16 NOTE — ED Notes (Signed)
Pts bed alarming, pt at edge of bed, needed assistance using urinal at this time, new linen on bed new gown on pt, pt back in bed, [place] on pt at this time and spo2 monitor

## 2020-10-16 NOTE — ED Notes (Signed)
Visitor with pt.

## 2020-10-17 ENCOUNTER — Encounter: Payer: Self-pay | Admitting: Internal Medicine

## 2020-10-17 DIAGNOSIS — I10 Essential (primary) hypertension: Secondary | ICD-10-CM | POA: Diagnosis not present

## 2020-10-17 DIAGNOSIS — E039 Hypothyroidism, unspecified: Secondary | ICD-10-CM

## 2020-10-17 DIAGNOSIS — I5031 Acute diastolic (congestive) heart failure: Secondary | ICD-10-CM | POA: Diagnosis not present

## 2020-10-17 DIAGNOSIS — G309 Alzheimer's disease, unspecified: Secondary | ICD-10-CM | POA: Diagnosis not present

## 2020-10-17 LAB — CBC
HCT: 53.7 % — ABNORMAL HIGH (ref 39.0–52.0)
Hemoglobin: 17.7 g/dL — ABNORMAL HIGH (ref 13.0–17.0)
MCH: 33.8 pg (ref 26.0–34.0)
MCHC: 33 g/dL (ref 30.0–36.0)
MCV: 102.5 fL — ABNORMAL HIGH (ref 80.0–100.0)
Platelets: 125 10*3/uL — ABNORMAL LOW (ref 150–400)
RBC: 5.24 MIL/uL (ref 4.22–5.81)
RDW: 13.7 % (ref 11.5–15.5)
WBC: 9.5 10*3/uL (ref 4.0–10.5)
nRBC: 0 % (ref 0.0–0.2)

## 2020-10-17 LAB — BASIC METABOLIC PANEL
Anion gap: 11 (ref 5–15)
BUN: 31 mg/dL — ABNORMAL HIGH (ref 8–23)
CO2: 27 mmol/L (ref 22–32)
Calcium: 8.7 mg/dL — ABNORMAL LOW (ref 8.9–10.3)
Chloride: 102 mmol/L (ref 98–111)
Creatinine, Ser: 1.69 mg/dL — ABNORMAL HIGH (ref 0.61–1.24)
GFR, Estimated: 38 mL/min — ABNORMAL LOW (ref >60)
Glucose, Bld: 98 mg/dL (ref 70–99)
Potassium: 4.2 mmol/L (ref 3.5–5.1)
Sodium: 140 mmol/L (ref 135–145)

## 2020-10-17 LAB — TROPONIN I (HIGH SENSITIVITY): Troponin I (High Sensitivity): 79 ng/L — ABNORMAL HIGH (ref <18)

## 2020-10-17 LAB — VITAMIN B12: Vitamin B-12: 334 pg/mL (ref 180–914)

## 2020-10-17 LAB — T4, FREE: Free T4: 1.05 ng/dL (ref 0.61–1.12)

## 2020-10-17 LAB — TSH: TSH: 6.911 u[IU]/mL — ABNORMAL HIGH (ref 0.350–4.500)

## 2020-10-17 LAB — PROCALCITONIN: Procalcitonin: <0.1 ng/mL

## 2020-10-17 MED ORDER — OLANZAPINE 10 MG IM SOLR
10.0000 mg | Freq: Once | INTRAMUSCULAR | Status: AC
  Administered 2020-10-17: 19:00:00 10 mg via INTRAMUSCULAR
  Filled 2020-10-17: qty 10

## 2020-10-17 MED ORDER — OLANZAPINE 10 MG PO TABS
10.0000 mg | ORAL_TABLET | Freq: Once | ORAL | Status: DC
  Filled 2020-10-17: qty 1

## 2020-10-17 MED ORDER — HALOPERIDOL LACTATE 5 MG/ML IJ SOLN: 2.0000 mg | Freq: Once | INTRAMUSCULAR | Status: AC

## 2020-10-17 MED ORDER — HALOPERIDOL LACTATE 5 MG/ML IJ SOLN
INTRAMUSCULAR | Status: AC
  Administered 2020-10-17: 17:00:00 2 mg
  Filled 2020-10-17: qty 1

## 2020-10-17 MED ORDER — HALOPERIDOL LACTATE 5 MG/ML IJ SOLN
2.0000 mg | INTRAMUSCULAR | Status: DC | PRN
  Administered 2020-10-17 – 2020-10-21 (×2): 2 mg via INTRAVENOUS
  Filled 2020-10-17 (×3): qty 1

## 2020-10-17 MED ORDER — QUETIAPINE FUMARATE 25 MG PO TABS
25.0000 mg | ORAL_TABLET | Freq: Every day | ORAL | Status: DC
Start: 2020-10-17 — End: 2020-10-18
  Administered 2020-10-17: 20:00:00 25 mg via ORAL
  Filled 2020-10-17: qty 1

## 2020-10-17 MED ORDER — ENOXAPARIN SODIUM 40 MG/0.4ML ~~LOC~~ SOLN
40.0000 mg | SUBCUTANEOUS | Status: DC
  Administered 2020-10-17: 16:00:00 40 mg via SUBCUTANEOUS
  Filled 2020-10-17: qty 0.4

## 2020-10-17 NOTE — Progress Notes (Signed)
PROGRESS NOTE    Louis Harrison   DJM:[id number]  DOB: 1928/06/05  PCP: [facility] [place]    DOA: 10/15/2020 LOS: 2   Brief Narrative   Louis Harrison is a 86 y.o. male with medical history significant for dementia, COPD with chronic respiratory failure, chronic diastolic dysfunction CHF and hypertension who was brought into the ER by EMS for evaluation of mental status changes.  Patient's son provided history that patient's dementia seems to be recently worsening over the past few weeks, with worsening visual hallucinations and agitations, refusing care, more difficult to reorient.  Patient lives alone son concerned this may no longer be safe.  In addition, son had noted progressive shortness of breath with exertion and cough which had become productive of yellow sputum.  Evaluation in the ED revealed elevated BNP of 1311, mild troponin elevation 65 >> 70, normal lactic acid, no leukocytosis, thrombocytopenia with platelets 110k..  Chest x-ray showed bibasilar airspace opacities concerning for infection or edema.  X-ray of left humerus was negative.  Admitted to hospitalist service with cardiology consulted.  Undergoing diuresis for acute on chronic diastolic CHF undergoing IV diuresis and being treated for mild acute exacerbation of COPD with relatively stable chronic respiratory failure (uses 2 L/min at baseline).   Patient had significant behavioral disturbances since admission, very combative and difficult to manage.  Psychiatry was consulted and has ordered as needed IV Haldol.     Assessment & Plan   Principal Problem:   Dementia ([place]) Active Problems:   Chronic respiratory failure (HCC)   COPD (chronic obstructive pulmonary disease) (HCC)   Hypertension   Coronary artery disease   Acute CHF (congestive heart failure) (HCC)   CKD (chronic kidney disease), stage IV (HCC)   Hypothyroidism   Acute on chronic diastolic CHF -present on admission with  dyspnea on exertion, elevated BNP and x-ray findings with pulmonary edema.  Cardiology is consulted.  Continue IV Lasix 40 mg twice daily.  Follow-up echocardiogram.  Continue atenolol.  Strict INO's and daily weights.  Low-sodium diet with fluid restriction.  BP control as below Net IO Since Admission: -701 mL [10/17/20 1403] (I/O's inaccurate due to urinary incontinence)  Dementia with behavioral disturbances -Per son, patient lives alone and has had more difficulties with worsening dementia including hallucinations, combativeness, refusal of care, taking off oxygen.  He has also had frequent falls and unsafe to remain home alone without 24/7 supervision. --Psychiatry was consulted --IV Haldol as needed --TOC consulted for placement --PT and OT evaluations --Seroquel at bedtime to start this evening after discussion with son today  Acute kidney injury -present on admission with creatinine 1.92.  Baseline is not entirely clear, likely has some component of CKD.  Creatinine improving with diuresis.  Monitor BMP.  Avoid nephrotoxins and hypotension.  Renally dose meds as indicated.  COPD with acute exacerbation / Chronic respiratory failure -at baseline patient uses 2 L/min nasal cannula oxygen.  This is fairly stable patient on 2.5 L/min this morning.  Continue scheduled and as needed nebs per orders, inhaled corticosteroid.  Zithromax.  Will defer systemic steroids for now given agitation and combativeness.  Patient not wheezing on exam today.  Elevated troponin -mild and flat trend without any active chest pain.  This likely secondary to demand ischemia in the setting of decompensated CHF.  Hypertension -chronic, uncontrolled on admission.  Continue lisinopril.  As needed hydralazine if uncontrolled.  History of CAD -stable with no active chest pain.  Continue lisinopril,  Imdur, aspirin and simvastatin  Hypothyroidism -continue Synthroid.  TSH elevated at 6.911.  Add on free T4    DVT  prophylaxis: enoxaparin (LOVENOX) injection 40 mg Start: 10/17/20 1600   Diet:  Diet Orders (From admission, onward)    Start     Ordered   10/15/20 1545  Diet 2 gram sodium Room service appropriate? Yes; Fluid consistency: Thin  Diet effective now       Question Answer Comment  Room service appropriate? Yes   Fluid consistency: Thin      10/15/20 1545            Code Status: DNR    Subjective 10/17/20    Patient seen with son at bedside today.  Pt reports feeling okay.  Denies chest pain or SOB.  He is having urinary frequency with incontinence.  Earlier pulled off condom catheter.  Per nursing reports has been somewhat agitated at times.   Disposition Plan & Communication   Status is: Inpatient  Remains inpatient appropriate because:IV treatments appropriate due to intensity of illness or inability to take PO   Dispo: The patient is from: Home              Anticipated d/c is to: SNF              Anticipated d/c date is: 2 days              Patient currently is not medically stable to d/c.   Family Communication: son was at bedside on rounds today   Consults, Procedures, Significant Events   Consultants:   Cardiology  Procedures:   Echo EF 45-50%  Antimicrobials:  Anti-infectives (From admission, onward)   Start     Dose/Rate Route Frequency Ordered Stop   10/15/20 1600  azithromycin (ZITHROMAX) tablet 500 mg        500 mg Oral Daily 10/15/20 1554 10/20/20 0959         Objective   Vitals:   10/16/20 1834 10/17/20 0350 10/17/20 0740 10/17/20 1133  BP:  (!) 185/97 131/70 125/70  Pulse: (!) 58 (!) 59 63 (!) 56  Resp: (!) 23 18 19 18   Temp:  (!) 97.5 F (36.4 C) (!) 97.5 F (36.4 C) (!) 97.5 F (36.4 C)  TempSrc:  Oral Axillary Oral  SpO2: 91% 92% 96% (!) 89%  Weight:  83.2 kg    Height:        Intake/Output Summary (Last 24 hours) at 10/17/2020 1359 Last data filed at 10/17/2020 0700 Gross per 24 hour  Intake --  Output 201 ml  Net -201  ml   Filed Weights   10/15/20 1109 10/17/20 0350  Weight: 86.2 kg 83.2 kg    Physical Exam:  General exam: awake, alert, no acute distress HEENT: moist mucus membranes, hearing grossly normal  Respiratory system: diminished bases, no wheezes, normal respiratory effort Cardiovascular system: normal S1/S2, RRR, trace LE edema.   Gastrointestinal system: soft, NT, ND Central nervous system: no gross focal neurologic deficits, CN's grossly intact   Labs   Data Reviewed: I have personally reviewed following labs and imaging studies  CBC: Recent Labs  Lab 10/15/20 1343 10/17/20 0713  WBC 7.8 9.5  NEUTROABS 6.2  --   HGB 15.6 17.7*  HCT 47.8 53.7*  MCV 104.8* 102.5*  PLT 110* 338*   Basic Metabolic Panel: Recent Labs  Lab 10/15/20 1115 10/17/20 0528  NA 143 140  K 4.4 4.2  CL 108 102  CO2 26 27  GLUCOSE 101* 98  BUN 27* 31*  CREATININE 1.92* 1.69*  CALCIUM 8.4* 8.7*   GFR: Estimated Creatinine Clearance: 30.9 mL/min (A) (by C-G formula based on SCr of 1.69 mg/dL (H)). Liver Function Tests: Recent Labs  Lab 10/15/20 1115  AST 19  ALT 17  ALKPHOS 58  BILITOT 1.5*  PROT 5.8*  ALBUMIN 3.5   Recent Labs  Lab 10/15/20 1115  LIPASE 19   No results for input(s): AMMONIA in the last 168 hours. Coagulation Profile: Recent Labs  Lab 10/15/20 1115  INR 1.1   Cardiac Enzymes: No results for input(s): CKTOTAL, CKMB, CKMBINDEX, TROPONINI in the last 168 hours. BNP (last 3 results) No results for input(s): PROBNP in the last 8760 hours. HbA1C: No results for input(s): HGBA1C in the last 72 hours. CBG: No results for input(s): GLUCAP in the last 168 hours. Lipid Profile: No results for input(s): CHOL, HDL, LDLCALC, TRIG, CHOLHDL, LDLDIRECT in the last 72 hours. Thyroid Function Tests: Recent Labs    10/17/20 0528  TSH 6.911*   Anemia Panel: Recent Labs    10/17/20 0528  VITAMINB12 334   Sepsis Labs: Recent Labs  Lab 10/15/20 1115 10/15/20 1343  10/17/20 0528  PROCALCITON  --   --  <0.10  LATICACIDVEN 1.6 1.4  --     Recent Results (from the past 240 hour(s))  Resp Panel by RT-PCR (Flu A&B, Covid) Nasopharyngeal Swab     Status: None   Collection Time: 10/15/20 11:38 AM   Specimen: Nasopharyngeal Swab; Nasopharyngeal(NP) swabs in vial transport medium  Result Value Ref Range Status   SARS Coronavirus 2 by RT PCR NEGATIVE NEGATIVE Final    Comment: (NOTE) SARS-CoV-2 target nucleic acids are NOT DETECTED.  The SARS-CoV-2 RNA is generally detectable in upper respiratory specimens during the acute phase of infection. The lowest concentration of SARS-CoV-2 viral copies this assay can detect is 138 copies/mL. A negative result does not preclude SARS-Cov-2 infection and should not be used as the sole basis for treatment or other patient management decisions. A negative result may occur with  improper specimen collection/handling, submission of specimen other than nasopharyngeal swab, presence of viral mutation(s) within the areas targeted by this assay, and inadequate number of viral copies(<138 copies/mL). A negative result must be combined with clinical observations, patient history, and epidemiological information. The expected result is Negative.  Fact Sheet for Patients:  EntrepreneurPulse.com.au  Fact Sheet for Healthcare Providers:  IncredibleEmployment.be  This test is no t yet approved or cleared by the [place] FDA and  has been authorized for detection and/or diagnosis of SARS-CoV-2 by FDA under an Emergency Use Authorization (EUA). This EUA will remain  in effect (meaning this test can be used) for the duration of the COVID-19 declaration under Section 564(b)(1) of the Act, 21 U.S.C.section 360bbb-3(b)(1), unless the authorization is terminated  or revoked sooner.       Influenza A by PCR NEGATIVE NEGATIVE Final   Influenza B by PCR NEGATIVE NEGATIVE Final    Comment:  (NOTE) The Xpert Xpress SARS-CoV-2/FLU/RSV plus assay is intended as an aid in the diagnosis of influenza from Nasopharyngeal swab specimens and should not be used as a sole basis for treatment. Nasal washings and aspirates are unacceptable for Xpert Xpress SARS-CoV-2/FLU/RSV testing.  Fact Sheet for Patients: EntrepreneurPulse.com.au  Fact Sheet for Healthcare Providers: IncredibleEmployment.be  This test is not yet approved or cleared by the [place] FDA and has been authorized for detection and/or  diagnosis of SARS-CoV-2 by FDA under an Emergency Use Authorization (EUA). This EUA will remain in effect (meaning this test can be used) for the duration of the COVID-19 declaration under Section 564(b)(1) of the Act, 21 U.S.C. section 360bbb-3(b)(1), unless the authorization is terminated or revoked.  Performed at [facility], [address] [place], [place] [address]       Imaging Studies   ECHOCARDIOGRAM COMPLETE  Result Date: 10/16/2020    ECHOCARDIOGRAM REPORT   Patient Name:   KORREY SCHLEICHER Date of Exam: 10/16/2020 Medical Rec #:  [id number]          Height:       71.0 in Accession #:    [id number]         Weight:       190.0 lb Date of Birth:  Aug 23, 1928           BSA:          2.063 m Patient Age:    89 years           BP:           158/86 mmHg Patient Gender: M                  HR:           57 bpm. Exam Location:  [facility] Procedure: 2D Echo, Color Doppler and Cardiac Doppler Indications:     I50.31 CHF-Acute Diastolic  History:         Patient has no prior history of Echocardiogram examinations.                  Previous Myocardial Infarction, COPD; Risk Factors:Hypertension                  and Dyslipidemia.  Sonographer:     Charmayne Sheer RDCS (AE) Referring Phys:  [id number] Collier Bullock Diagnosing Phys: Serafina Royals MD  Sonographer Comments: No subcostal window. Image acquisition challenging due to COPD and Image acquisition  challenging due to respiratory motion. IMPRESSIONS  1. Left ventricular ejection fraction, by estimation, is 45 to 50%. The left ventricle has mildly decreased function. The left ventricle demonstrates regional wall motion abnormalities (see scoring diagram/findings for description). Left ventricular diastolic parameters were normal.  2. Right ventricular systolic function is normal. The right ventricular size is normal.  3. Left atrial size was mildly dilated.  4. The mitral valve is normal in structure. Mild mitral valve regurgitation.  5. The aortic valve is normal in structure. Aortic valve regurgitation is not visualized. FINDINGS  Left Ventricle: Left ventricular ejection fraction, by estimation, is 45 to 50%. The left ventricle has mildly decreased function. The left ventricle demonstrates regional wall motion abnormalities. Mild hypokinesis of the left ventricular, apical apical segment. The left ventricular internal cavity size was normal in size. There is no left ventricular hypertrophy. Left ventricular diastolic parameters were normal. Right Ventricle: The right ventricular size is normal. No increase in right ventricular wall thickness. Right ventricular systolic function is normal. Left Atrium: Left atrial size was mildly dilated. Right Atrium: Right atrial size was normal in size. Pericardium: There is no evidence of pericardial effusion. Mitral Valve: The mitral valve is normal in structure. Mild mitral valve regurgitation. MV peak gradient, 1.2 mmHg. The mean mitral valve gradient is 0.0 mmHg. Tricuspid Valve: The tricuspid valve is normal in structure. Tricuspid valve regurgitation is mild. Aortic Valve: The aortic valve is normal in structure. Aortic valve regurgitation is  not visualized. Aortic valve mean gradient measures 3.0 mmHg. Aortic valve peak gradient measures 4.2 mmHg. Aortic valve area, by VTI measures 2.45 cm. Pulmonic Valve: The pulmonic valve was normal in structure. Pulmonic valve  regurgitation is trivial. Aorta: The aortic root and ascending aorta are structurally normal, with no evidence of dilitation. IAS/Shunts: No atrial level shunt detected by color flow Doppler.  LEFT VENTRICLE PLAX 2D LVIDd:         4.96 cm  Diastology LVIDs:         3.26 cm  LV e' lateral:   6.09 cm/s LV PW:         0.97 cm  LV E/e' lateral: 7.8 LV IVS:        0.89 cm LVOT diam:     2.10 cm LV SV:         49 LV SV Index:   24 LVOT Area:     3.46 cm  RIGHT VENTRICLE RV Basal diam:  3.48 cm LEFT ATRIUM             Index       RIGHT ATRIUM           Index LA diam:        4.20 cm 2.04 cm/m  RA Area:     14.70 cm LA Vol (A2C):   66.4 ml 32.18 ml/m RA Volume:   32.80 ml  15.90 ml/m LA Vol (A4C):   43.8 ml 21.23 ml/m LA Biplane Vol: 54.7 ml 26.51 ml/m  AORTIC VALVE                   PULMONIC VALVE AV Area (Vmax):    2.41 cm    PV Vmax:       0.89 m/s AV Area (Vmean):   2.04 cm    PV Vmean:      59.200 cm/s AV Area (VTI):     2.45 cm    PV VTI:        0.163 m AV Vmax:           103.00 cm/s PV Peak grad:  3.2 mmHg AV Vmean:          76.900 cm/s PV Mean grad:  2.0 mmHg AV VTI:            0.201 m AV Peak Grad:      4.2 mmHg AV Mean Grad:      3.0 mmHg LVOT Vmax:         71.80 cm/s LVOT Vmean:        45.200 cm/s LVOT VTI:          0.142 m LVOT/AV VTI ratio: 0.71  AORTA Ao Root diam: 3.50 cm MITRAL VALVE MV Area (PHT): 2.59 cm    SHUNTS MV Peak grad:  1.2 mmHg    Systemic VTI:  0.14 m MV Mean grad:  0.0 mmHg    Systemic Diam: 2.10 cm MV Vmax:       0.55 m/s MV Vmean:      33.6 cm/s MV Decel Time: 293 msec MV E velocity: 47.60 cm/s MV A velocity: 39.80 cm/s MV E/A ratio:  1.20 Serafina Royals MD Electronically signed by Serafina Royals MD Signature Date/Time: 10/16/2020/2:47:53 PM    Final      Medications   Scheduled Meds: . aspirin EC  81 mg Oral Daily  . atenolol  25 mg Oral Daily  . azithromycin  500 mg Oral Daily  . budesonide (PULMICORT)  nebulizer solution  0.25 mg Nebulization BID  .  dextromethorphan-guaiFENesin  1 tablet Oral BID  . enoxaparin (LOVENOX) injection  40 mg Subcutaneous Q24H  . furosemide  40 mg Intravenous Q12H  . ipratropium-albuterol  3 mL Nebulization Q6H  . isosorbide mononitrate  30 mg Oral Daily  . levothyroxine  125 mcg Oral Q0600  . lisinopril  5 mg Oral Daily  . pantoprazole  40 mg Oral Daily  . QUEtiapine  25 mg Oral QHS  . simvastatin  20 mg Oral Daily  . sodium chloride flush  3 mL Intravenous Q12H   Continuous Infusions: . sodium chloride         LOS: 2 days    Time spent: 25 minutes with greater than 50% spent in coordination of care and direct patient contact.    Ezekiel Slocumb, DO [facility]  10/17/2020, 1:59 PM    If 7PM-7AM, please contact night-coverage. How to contact the [facility] Attending or Consulting provider [place] or covering provider during after hours [place], for this patient?    1. Check the care team in [facility] and look for a) attending/consulting [facility] provider listed and b) the [facility] team listed 2. Log into [email or web address] and use [facility]'s universal password to access. If you do not have the password, please contact the hospital operator. 3. Locate the [facility] provider you are looking for under [facility] and page to a number that you can be directly reached. 4. If you still have difficulty reaching the provider, please page the [facility] (Director on Call) for the Hospitalists listed on amion for assistance.

## 2020-10-17 NOTE — Progress Notes (Signed)
[facility] Cardiology [facility] Encounter Note  Patient: Charlies Rayburn Bogdan / Admit Date: 10/15/2020 / Date of Encounter: 10/17/2020, 6:37 AM   Subjective: 12/25.  Overall patient has been active throughout the evening with no specific complaints.  Patient cannot converse well about his symptoms and/or health at this time.  Telemetry has continued to show sinus bradycardia with no other significant rhythm disturbances.  Troponin is 70/82/93 consistent with either demand ischemia or kidney dysfunction without evidence of acute coronary syndrome.  Patient has had acute on chronic systolic dysfunction heart failure with some bibasilar Rales unchanged from before but concerns for chronic kidney disease now with only 500 cc net output and continued trace lower extremity edema.  Echocardiogram showing mild apical hypokinesis with ejection fraction of 45% and some mitral and tricuspid regurgitation  Review of Systems: Cannot assess   objective: Telemetry: Sinus bradycardia Physical Exam: Blood pressure (!) 185/97, pulse (!) 59, temperature (!) 97.5 F (36.4 C), temperature source Oral, resp. rate 18, height 5\' 11"  (1.803 m), weight 83.2 kg, SpO2 92 %. Body mass index is 25.59 kg/m. General: Well developed, well nourished, in no acute distress. Head: Normocephalic, atraumatic, sclera non-icteric, no xanthomas, nares are without discharge. Neck: No apparent masses Lungs: Normal respirations with few wheezes, no rhonchi, no rales , bibasilar crackles   Heart: Regular rate and rhythm, normal S1 S2, no murmur, no rub, no gallop, PMI is normal size and placement, carotid upstroke normal without bruit, jugular venous pressure normal Abdomen: Soft, non-tender, non-distended with normoactive bowel sounds. No hepatosplenomegaly. Abdominal aorta is normal size without bruit Extremities: N trace edema, no clubbing, no cyanosis, no ulcers,  Peripheral: 2+ radial, 2+ femoral, 2+ dorsal pedal pulses Neuro:  Not alert and oriented. Moves all extremities spontaneously. Psych: Does not responds to questions appropriately with a normal affect.   Intake/Output Summary (Last 24 hours) at 10/17/2020 2355 Last data filed at 10/16/2020 1359 Gross per 24 hour  Intake --  Output 200 ml  Net -200 ml    Inpatient Medications:  . aspirin EC  81 mg Oral Daily  . atenolol  25 mg Oral Daily  . azithromycin  500 mg Oral Daily  . budesonide (PULMICORT) nebulizer solution  0.25 mg Nebulization BID  . dextromethorphan-guaiFENesin  1 tablet Oral BID  . enoxaparin (LOVENOX) injection  30 mg Subcutaneous Q24H  . furosemide  40 mg Intravenous Q12H  . ipratropium-albuterol  3 mL Nebulization Q6H  . isosorbide mononitrate  30 mg Oral Daily  . levothyroxine  125 mcg Oral Q0600  . lisinopril  5 mg Oral Daily  . pantoprazole  40 mg Oral Daily  . simvastatin  20 mg Oral Daily  . sodium chloride flush  3 mL Intravenous Q12H   Infusions:  . sodium chloride      Labs: Recent Labs    10/15/20 1115  NA 143  K 4.4  CL 108  CO2 26  GLUCOSE 101*  BUN 27*  CREATININE 1.92*  CALCIUM 8.4*   Recent Labs    10/15/20 1115  AST 19  ALT 17  ALKPHOS 58  BILITOT 1.5*  PROT 5.8*  ALBUMIN 3.5   Recent Labs    10/15/20 1343  WBC 7.8  NEUTROABS 6.2  HGB 15.6  HCT 47.8  MCV 104.8*  PLT 110*   No results for input(s): CKTOTAL, CKMB, TROPONINI in the last 72 hours. Invalid input(s): POCBNP No results for input(s): HGBA1C in the last 72 hours.   Weights: [employer]  Weights   10/15/20 1109 10/17/20 0350  Weight: 86.2 kg 83.2 kg     Radiology/Studies:  CT Head Wo Contrast  Result Date: 10/15/2020 CLINICAL DATA:  Mental status change.  History of dementia. EXAM: CT HEAD WITHOUT CONTRAST TECHNIQUE: Contiguous axial images were obtained from the base of the skull through the vertex without intravenous contrast. COMPARISON:  CT head 07/31/2014. FINDINGS: Brain: There is no evidence of acute intracranial  hemorrhage, mass lesion, brain edema or extra-axial fluid collection. Mildly progressive atrophy with prominence of the ventricles and subarachnoid spaces. There is patchy low-density in the periventricular white matter bilaterally, likely due to chronic small vessel ischemic changes. There is no CT evidence of acute cortical infarction. Vascular:  No hyperdense vessel identified. Skull: Negative for fracture or focal lesion. Sinuses/Orbits: The visualized paranasal sinuses and mastoid air cells are clear. No orbital abnormalities are seen. Other: None. IMPRESSION: 1. No acute intracranial findings. 2. Mildly progressive atrophy and chronic small vessel ischemic changes. Electronically Signed   By: Richardean Sale M.D.   On: 10/15/2020 11:46   DG Chest Port 1 View  Result Date: 10/15/2020 CLINICAL DATA:  Syncopal episodes.  Multiple falls.  Dementia. EXAM: PORTABLE CHEST 1 VIEW COMPARISON:  Radiographs 03/12/2019 and 11/14/2017.  CT 12/20/2017. FINDINGS: 1120 hours. There are significantly lower lung volumes. The heart size and mediastinal contours are stable with aortic atherosclerosis. There are new patchy airspace opacities at both lung bases associated with poor definition of the pulmonary vasculature. No large pleural effusion, confluent airspace opacity or pneumothorax identified. Telemetry leads overlie the chest. No acute osseous findings. IMPRESSION: Lower lung volumes with new patchy bibasilar airspace opacities which could reflect viral infection or edema. Followup PA and lateral views recommended when the patient is able. Electronically Signed   By: Richardean Sale M.D.   On: 10/15/2020 11:44   DG Humerus Left  Result Date: 10/15/2020 CLINICAL DATA:  Frequent falls.  Left arm bruising.  Dementia. EXAM: LEFT HUMERUS - 2+ VIEW COMPARISON:  None. FINDINGS: The mineralization and alignment are normal. There is no evidence of acute fracture or dislocation. Minor degenerative changes at the shoulder  and elbow for age. No foreign body or focal soft tissue swelling identified. IMPRESSION: No acute findings. Minor degenerative changes. Electronically Signed   By: Richardean Sale M.D.   On: 10/15/2020 11:41   ECHOCARDIOGRAM COMPLETE  Result Date: 10/16/2020    ECHOCARDIOGRAM REPORT   Patient Name:   TZION WEDEL Date of Exam: 10/16/2020 Medical Rec #:  [id number]          Height:       71.0 in Accession #:    [id number]         Weight:       190.0 lb Date of Birth:  Jun 10, 1928           BSA:          2.063 m Patient Age:    46 years           BP:           158/86 mmHg Patient Gender: M                  HR:           57 bpm. Exam Location:  [facility] Procedure: 2D Echo, Color Doppler and Cardiac Doppler Indications:     I50.31 CHF-Acute Diastolic  History:         Patient has no prior history  of Echocardiogram examinations.                  Previous Myocardial Infarction, COPD; Risk Factors:Hypertension                  and Dyslipidemia.  Sonographer:     Charmayne Sheer RDCS (AE) Referring Phys:  [id number] Collier Bullock Diagnosing Phys: Serafina Royals MD  Sonographer Comments: No subcostal window. Image acquisition challenging due to COPD and Image acquisition challenging due to respiratory motion. IMPRESSIONS  1. Left ventricular ejection fraction, by estimation, is 45 to 50%. The left ventricle has mildly decreased function. The left ventricle demonstrates regional wall motion abnormalities (see scoring diagram/findings for description). Left ventricular diastolic parameters were normal.  2. Right ventricular systolic function is normal. The right ventricular size is normal.  3. Left atrial size was mildly dilated.  4. The mitral valve is normal in structure. Mild mitral valve regurgitation.  5. The aortic valve is normal in structure. Aortic valve regurgitation is not visualized. FINDINGS  Left Ventricle: Left ventricular ejection fraction, by estimation, is 45 to 50%. The left ventricle has mildly decreased  function. The left ventricle demonstrates regional wall motion abnormalities. Mild hypokinesis of the left ventricular, apical apical segment. The left ventricular internal cavity size was normal in size. There is no left ventricular hypertrophy. Left ventricular diastolic parameters were normal. Right Ventricle: The right ventricular size is normal. No increase in right ventricular wall thickness. Right ventricular systolic function is normal. Left Atrium: Left atrial size was mildly dilated. Right Atrium: Right atrial size was normal in size. Pericardium: There is no evidence of pericardial effusion. Mitral Valve: The mitral valve is normal in structure. Mild mitral valve regurgitation. MV peak gradient, 1.2 mmHg. The mean mitral valve gradient is 0.0 mmHg. Tricuspid Valve: The tricuspid valve is normal in structure. Tricuspid valve regurgitation is mild. Aortic Valve: The aortic valve is normal in structure. Aortic valve regurgitation is not visualized. Aortic valve mean gradient measures 3.0 mmHg. Aortic valve peak gradient measures 4.2 mmHg. Aortic valve area, by VTI measures 2.45 cm. Pulmonic Valve: The pulmonic valve was normal in structure. Pulmonic valve regurgitation is trivial. Aorta: The aortic root and ascending aorta are structurally normal, with no evidence of dilitation. IAS/Shunts: No atrial level shunt detected by color flow Doppler.  LEFT VENTRICLE PLAX 2D LVIDd:         4.96 cm  Diastology LVIDs:         3.26 cm  LV e' lateral:   6.09 cm/s LV PW:         0.97 cm  LV E/e' lateral: 7.8 LV IVS:        0.89 cm LVOT diam:     2.10 cm LV SV:         49 LV SV Index:   24 LVOT Area:     3.46 cm  RIGHT VENTRICLE RV Basal diam:  3.48 cm LEFT ATRIUM             Index       RIGHT ATRIUM           Index LA diam:        4.20 cm 2.04 cm/m  RA Area:     14.70 cm LA Vol (A2C):   66.4 ml 32.18 ml/m RA Volume:   32.80 ml  15.90 ml/m LA Vol (A4C):   43.8 ml 21.23 ml/m LA Biplane Vol: 54.7 ml 26.51 ml/m   AORTIC VALVE  PULMONIC VALVE AV Area (Vmax):    2.41 cm    PV Vmax:       0.89 m/s AV Area (Vmean):   2.04 cm    PV Vmean:      59.200 cm/s AV Area (VTI):     2.45 cm    PV VTI:        0.163 m AV Vmax:           103.00 cm/s PV Peak grad:  3.2 mmHg AV Vmean:          76.900 cm/s PV Mean grad:  2.0 mmHg AV VTI:            0.201 m AV Peak Grad:      4.2 mmHg AV Mean Grad:      3.0 mmHg LVOT Vmax:         71.80 cm/s LVOT Vmean:        45.200 cm/s LVOT VTI:          0.142 m LVOT/AV VTI ratio: 0.71  AORTA Ao Root diam: 3.50 cm MITRAL VALVE MV Area (PHT): 2.59 cm    SHUNTS MV Peak grad:  1.2 mmHg    Systemic VTI:  0.14 m MV Mean grad:  0.0 mmHg    Systemic Diam: 2.10 cm MV Vmax:       0.55 m/s MV Vmean:      33.6 cm/s MV Decel Time: 293 msec MV E velocity: 47.60 cm/s MV A velocity: 39.80 cm/s MV E/A ratio:  1.20 Serafina Royals MD Electronically signed by Serafina Royals MD Signature Date/Time: 10/16/2020/2:47:53 PM    Final      Assessment and Recommendation  86 y.o. 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  Continue atenolol for heart rate control and blood pressure control despite some bradycardia which appears to be asymptomatic 3.  Continuation of hydralazine isosorbide lisinopril combination for hypertension control and mild cardiomyopathy in the setting of chronic kidney  disease and would consider discontinuation of lisinopril if patient's kidney dysfunction worsens by lab work today 4.  No further intervention of minimal elevation of troponin more consistent with demand ischemia 5.  Begin ambulation and physical rehabilitation as able  Signed, Serafina Royals M.D. FACC

## 2020-10-17 NOTE — Plan of Care (Signed)

## 2020-10-18 DIAGNOSIS — G309 Alzheimer's disease, unspecified: Secondary | ICD-10-CM | POA: Diagnosis not present

## 2020-10-18 DIAGNOSIS — F0281 Dementia in other diseases classified elsewhere with behavioral disturbance: Secondary | ICD-10-CM | POA: Diagnosis not present

## 2020-10-18 DIAGNOSIS — J9610 Chronic respiratory failure, unspecified whether with hypoxia or hypercapnia: Secondary | ICD-10-CM | POA: Diagnosis not present

## 2020-10-18 LAB — CBC
HCT: 53.5 % — ABNORMAL HIGH (ref 39.0–52.0)
Hemoglobin: 17.4 g/dL — ABNORMAL HIGH (ref 13.0–17.0)
MCH: 33.5 pg (ref 26.0–34.0)
MCHC: 32.5 g/dL (ref 30.0–36.0)
MCV: 103.1 fL — ABNORMAL HIGH (ref 80.0–100.0)
Platelets: 130 10*3/uL — ABNORMAL LOW (ref 150–400)
RBC: 5.19 MIL/uL (ref 4.22–5.81)
RDW: 13.6 % (ref 11.5–15.5)
WBC: 10.9 10*3/uL — ABNORMAL HIGH (ref 4.0–10.5)
nRBC: 0 % (ref 0.0–0.2)

## 2020-10-18 LAB — BASIC METABOLIC PANEL
Anion gap: 11 (ref 5–15)
BUN: 32 mg/dL — ABNORMAL HIGH (ref 8–23)
CO2: 32 mmol/L (ref 22–32)
Calcium: 8.7 mg/dL — ABNORMAL LOW (ref 8.9–10.3)
Chloride: 97 mmol/L — ABNORMAL LOW (ref 98–111)
Creatinine, Ser: 1.89 mg/dL — ABNORMAL HIGH (ref 0.61–1.24)
GFR, Estimated: 33 mL/min — ABNORMAL LOW (ref >60)
Glucose, Bld: 93 mg/dL (ref 70–99)
Potassium: 4.3 mmol/L (ref 3.5–5.1)
Sodium: 140 mmol/L (ref 135–145)

## 2020-10-18 LAB — MAGNESIUM: Magnesium: 2 mg/dL (ref 1.7–2.4)

## 2020-10-18 MED ORDER — TORSEMIDE 20 MG PO TABS
40.0000 mg | ORAL_TABLET | Freq: Every day | ORAL | Status: DC
  Administered 2020-10-18: 16:00:00 40 mg via ORAL
  Filled 2020-10-18: qty 2

## 2020-10-18 MED ORDER — QUETIAPINE FUMARATE 25 MG PO TABS
50.0000 mg | ORAL_TABLET | Freq: Every day | ORAL | Status: DC
  Administered 2020-10-18 – 2020-10-20 (×2): 50 mg via ORAL
  Filled 2020-10-18 (×2): qty 2

## 2020-10-18 MED ORDER — QUETIAPINE FUMARATE 25 MG PO TABS: 50.0000 mg | ORAL_TABLET | Freq: Two times a day (BID) | ORAL | Status: DC

## 2020-10-18 MED ORDER — ENOXAPARIN SODIUM 30 MG/0.3ML ~~LOC~~ SOLN
30.0000 mg | SUBCUTANEOUS | Status: DC
  Administered 2020-10-18 – 2020-10-25 (×8): 30 mg via SUBCUTANEOUS
  Filled 2020-10-18 (×8): qty 0.3

## 2020-10-18 MED ORDER — IPRATROPIUM-ALBUTEROL 0.5-2.5 (3) MG/3ML IN SOLN
3.0000 mL | Freq: Three times a day (TID) | RESPIRATORY_TRACT | Status: DC
  Administered 2020-10-18: 08:00:00 3 mL via RESPIRATORY_TRACT
  Filled 2020-10-18: qty 3

## 2020-10-18 MED ORDER — QUETIAPINE FUMARATE 25 MG PO TABS
100.0000 mg | ORAL_TABLET | Freq: Every day | ORAL | Status: DC
  Administered 2020-10-18: 22:00:00 100 mg via ORAL
  Filled 2020-10-18: qty 4

## 2020-10-18 MED ORDER — IPRATROPIUM-ALBUTEROL 0.5-2.5 (3) MG/3ML IN SOLN
3.0000 mL | Freq: Two times a day (BID) | RESPIRATORY_TRACT | Status: DC
  Administered 2020-10-18 – 2020-10-20 (×5): 3 mL via RESPIRATORY_TRACT
  Filled 2020-10-18 (×5): qty 3

## 2020-10-18 NOTE — Progress Notes (Addendum)
PROGRESS NOTE    Louis Harrison   PFX:[id number]  DOB: 1928/05/27  PCP: [facility] [place]    DOA: 10/15/2020 LOS: 3   Brief Narrative   Louis Harrison is a 86 y.o. male with medical history significant for dementia, COPD with chronic respiratory failure, chronic diastolic dysfunction CHF and hypertension who was brought into the ER by EMS for evaluation of mental status changes.  Patient's son provided history that patient's dementia seems to be recently worsening over the past few weeks, with worsening visual hallucinations and agitations, refusing care, more difficult to reorient.  Patient lives alone son concerned this may no longer be safe.  In addition, son had noted progressive shortness of breath with exertion and cough which had become productive of yellow sputum.  Evaluation in the ED revealed elevated BNP of 1311, mild troponin elevation 65 >> 70, normal lactic acid, no leukocytosis, thrombocytopenia with platelets 110k..  Chest x-ray showed bibasilar airspace opacities concerning for infection or edema.  X-ray of left humerus was negative.  Admitted to hospitalist service with cardiology consulted.  Undergoing diuresis for acute on chronic diastolic CHF undergoing IV diuresis and being treated for mild acute exacerbation of COPD with relatively stable chronic respiratory failure (uses 2 L/min at baseline).   Patient had significant behavioral disturbances since admission, very combative and difficult to manage.  Psychiatry was consulted and has ordered as needed IV Haldol.     Assessment & Plan   Principal Problem:   Dementia ([place]) Active Problems:   Chronic respiratory failure (HCC)   COPD (chronic obstructive pulmonary disease) (HCC)   Hypertension   Coronary artery disease   Acute CHF (congestive heart failure) (HCC)   CKD (chronic kidney disease), stage IV (HCC)   Hypothyroidism   Acute on chronic diastolic CHF -present on admission with  dyspnea on exertion, elevated BNP and x-ray findings with pulmonary edema.  Cardiology is consulted.   Stop IV Lasix and start on oral torsemide 40 mg daily Continue atenolol.   Strict I/O's and daily weights.   Low-sodium diet with fluid restriction.   BP control as below Net IO Since Admission: -3,701 mL [10/18/20 1508] (I/O's inaccurate due to urinary incontinence)   Dementia with behavioral disturbances -Per son, patient lives alone and has had more difficulties with worsening dementia including hallucinations, combativeness, refusal of care, taking off oxygen.  He has also had frequent falls and unsafe to remain home alone without 24/7 supervision. --Psychiatry consulted --IV Haldol as needed --TOC consulted for placement --PT and OT evaluations when able --Seroquel 50 mg in the morning and 100 mg at bedtime (this was discussed with psychiatrist by secure chat) --Palliative care consulted after discussion with son this morning --Serial EKGs to monitor QTC -today's EKG is pending  Acute kidney injury -present on admission with creatinine 1.92.  Baseline is not entirely clear, likely has some component of CKD.  Monitor BMP.  Avoid nephrotoxins and hypotension.  Renally dose meds as indicated.   COPD with acute exacerbation / Chronic respiratory failure -at baseline patient uses 2 L/min nasal cannula oxygen.  This is fairly stable patient on 2.5 L/min this morning.  Continue scheduled and as needed nebs per orders, inhaled corticosteroid.  Zithromax.  Will defer systemic steroids for now given agitation and combativeness.  Patient not wheezing on exam.   Elevated troponin -mild and flat trend without any active chest pain.  This likely secondary to demand ischemia in the setting of decompensated CHF.  Hypertension -chronic, uncontrolled on admission.  Continue lisinopril.  As needed hydralazine if uncontrolled.   History of CAD -stable with no active chest pain.  Continue  lisinopril, Imdur, aspirin and simvastatin   Hypothyroidism -continue Synthroid.  TSH elevated at 6.911, but free T4 normal.    DVT prophylaxis: enoxaparin (LOVENOX) injection 30 mg Start: 10/18/20 2000   Diet:  Diet Orders (From admission, onward)    Start     Ordered   10/15/20 1545  Diet 2 gram sodium Room service appropriate? Yes; Fluid consistency: Thin  Diet effective now       Question Answer Comment  Room service appropriate? Yes   Fluid consistency: Thin      10/15/20 1545            Code Status: DNR    Subjective 10/18/20    Patient seen with son at bedside today.  Patient was up and combative much of the night.  Son had to come back to the hospital and he states this morning it took 4 people to keep the patient from getting up out of the bed.  He just fell asleep at 630 this morning.     Disposition Plan & Communication   Status is: Inpatient  Remains inpatient appropriate because:IV treatments appropriate due to intensity of illness or inability to take PO.  Monitor volume status with transition from IV to oral diuretic.  Adjusting medications for control of behavioral disturbances in setting of dementia.   Dispo: The patient is from: Home              Anticipated d/c is to: SNF              Anticipated d/c date is: 2 days              Patient currently is not medically stable to d/c.   Family Communication: son was at bedside on rounds today   Consults, Procedures, Significant Events   Consultants:   Cardiology  Procedures:   Echo EF 45-50%  Antimicrobials:  Anti-infectives (From admission, onward)   Start     Dose/Rate Route Frequency Ordered Stop   10/15/20 1600  azithromycin (ZITHROMAX) tablet 500 mg        500 mg Oral Daily 10/15/20 1554 10/20/20 0959         Objective   Vitals:   10/17/20 1626 10/17/20 1957 10/17/20 2025 10/18/20 1253  BP: (!) 148/94  (!) 150/91 (!) 119/105  Pulse: (!) 59  (!) 56 62  Resp: (!) 21  18 18    Temp: 98.2 F (36.8 C)  98 F (36.7 C) 98 F (36.7 C)  TempSrc: Oral  Oral Axillary  SpO2: (!) 89% 97% 91%   Weight:      Height:        Intake/Output Summary (Last 24 hours) at 10/18/2020 1508 Last data filed at 10/18/2020 1052 Gross per 24 hour  Intake 0 ml  Output 1000 ml  Net -1000 ml   Filed Weights   10/15/20 1109 10/17/20 0350  Weight: 86.2 kg 83.2 kg    Physical Exam:  General exam: Sleeping comfortably, no acute distress Respiratory system: Symmetric chest rise, normal respiratory effort, on nasal cannula oxygen Cardiovascular system: normal S1/S2, RRR Gastrointestinal system: soft, NT, ND Central nervous system: no gross focal neurologic deficits, full neurologic exam not completed as patient sleeping after upon night agitated   Labs   Data Reviewed: I have personally reviewed following labs and imaging  studies  CBC: Recent Labs  Lab 10/15/20 1343 10/17/20 0713 10/18/20 0854  WBC 7.8 9.5 10.9*  NEUTROABS 6.2  --   --   HGB 15.6 17.7* 17.4*  HCT 47.8 53.7* 53.5*  MCV 104.8* 102.5* 103.1*  PLT 110* 125* 818*   Basic Metabolic Panel: Recent Labs  Lab 10/15/20 1115 10/17/20 0528 10/18/20 0854  NA 143 140 140  K 4.4 4.2 4.3  CL 108 102 97*  CO2 26 27 32  GLUCOSE 101* 98 93  BUN 27* 31* 32*  CREATININE 1.92* 1.69* 1.89*  CALCIUM 8.4* 8.7* 8.7*  MG  --   --  2.0   GFR: Estimated Creatinine Clearance: 27.7 mL/min (A) (by C-G formula based on SCr of 1.89 mg/dL (H)). Liver Function Tests: Recent Labs  Lab 10/15/20 1115  AST 19  ALT 17  ALKPHOS 58  BILITOT 1.5*  PROT 5.8*  ALBUMIN 3.5   Recent Labs  Lab 10/15/20 1115  LIPASE 19   No results for input(s): AMMONIA in the last 168 hours. Coagulation Profile: Recent Labs  Lab 10/15/20 1115  INR 1.1   Cardiac Enzymes: No results for input(s): CKTOTAL, CKMB, CKMBINDEX, TROPONINI in the last 168 hours. BNP (last 3 results) No results for input(s): PROBNP in the last 8760  hours. HbA1C: No results for input(s): HGBA1C in the last 72 hours. CBG: No results for input(s): GLUCAP in the last 168 hours. Lipid Profile: No results for input(s): CHOL, HDL, LDLCALC, TRIG, CHOLHDL, LDLDIRECT in the last 72 hours. Thyroid Function Tests: Recent Labs    10/17/20 0528  TSH 6.911*  FREET4 1.05   Anemia Panel: Recent Labs    10/17/20 0528  VITAMINB12 334   Sepsis Labs: Recent Labs  Lab 10/15/20 1115 10/15/20 1343 10/17/20 0528  PROCALCITON  --   --  <0.10  LATICACIDVEN 1.6 1.4  --     Recent Results (from the past 240 hour(s))  Resp Panel by RT-PCR (Flu A&B, Covid) Nasopharyngeal Swab     Status: None   Collection Time: 10/15/20 11:38 AM   Specimen: Nasopharyngeal Swab; Nasopharyngeal(NP) swabs in vial transport medium  Result Value Ref Range Status   SARS Coronavirus 2 by RT PCR NEGATIVE NEGATIVE Final    Comment: (NOTE) SARS-CoV-2 target nucleic acids are NOT DETECTED.  The SARS-CoV-2 RNA is generally detectable in upper respiratory specimens during the acute phase of infection. The lowest concentration of SARS-CoV-2 viral copies this assay can detect is 138 copies/mL. A negative result does not preclude SARS-Cov-2 infection and should not be used as the sole basis for treatment or other patient management decisions. A negative result may occur with  improper specimen collection/handling, submission of specimen other than nasopharyngeal swab, presence of viral mutation(s) within the areas targeted by this assay, and inadequate number of viral copies(<138 copies/mL). A negative result must be combined with clinical observations, patient history, and epidemiological information. The expected result is Negative.  Fact Sheet for Patients:  EntrepreneurPulse.com.au  Fact Sheet for Healthcare Providers:  IncredibleEmployment.be  This test is no t yet approved or cleared by the [place] FDA and  has been  authorized for detection and/or diagnosis of SARS-CoV-2 by FDA under an Emergency Use Authorization (EUA). This EUA will remain  in effect (meaning this test can be used) for the duration of the COVID-19 declaration under Section 564(b)(1) of the Act, 21 U.S.C.section 360bbb-3(b)(1), unless the authorization is terminated  or revoked sooner.       Influenza A  by PCR NEGATIVE NEGATIVE Final   Influenza B by PCR NEGATIVE NEGATIVE Final    Comment: (NOTE) The Xpert Xpress SARS-CoV-2/FLU/RSV plus assay is intended as an aid in the diagnosis of influenza from Nasopharyngeal swab specimens and should not be used as a sole basis for treatment. Nasal washings and aspirates are unacceptable for Xpert Xpress SARS-CoV-2/FLU/RSV testing.  Fact Sheet for Patients: EntrepreneurPulse.com.au  Fact Sheet for Healthcare Providers: IncredibleEmployment.be  This test is not yet approved or cleared by the [place] FDA and has been authorized for detection and/or diagnosis of SARS-CoV-2 by FDA under an Emergency Use Authorization (EUA). This EUA will remain in effect (meaning this test can be used) for the duration of the COVID-19 declaration under Section 564(b)(1) of the Act, 21 U.S.C. section 360bbb-3(b)(1), unless the authorization is terminated or revoked.  Performed at [facility], [address] [place], [place] [address]       Imaging Studies   No results found.   Medications   Scheduled Meds: . aspirin EC  81 mg Oral Daily  . atenolol  25 mg Oral Daily  . azithromycin  500 mg Oral Daily  . budesonide (PULMICORT) nebulizer solution  0.25 mg Nebulization BID  . dextromethorphan-guaiFENesin  1 tablet Oral BID  . enoxaparin (LOVENOX) injection  30 mg Subcutaneous Q24H  . ipratropium-albuterol  3 mL Nebulization BID  . isosorbide mononitrate  30 mg Oral Daily  . levothyroxine  125 mcg Oral Q0600  . lisinopril  5 mg Oral Daily   . pantoprazole  40 mg Oral Daily  . QUEtiapine  100 mg Oral QHS  . QUEtiapine  50 mg Oral Daily  . simvastatin  20 mg Oral Daily  . sodium chloride flush  3 mL Intravenous Q12H  . torsemide  40 mg Oral Daily   Continuous Infusions: . sodium chloride         LOS: 3 days    Time spent: 30 minutes with greater than 50% spent in coordination of care and direct patient contact.    Ezekiel Slocumb, DO [facility]  10/18/2020, 3:08 PM    If 7PM-7AM, please contact night-coverage. How to contact the [facility] Attending or Consulting provider [facility] or covering provider during after hours [place], for this patient?    1. Check the care team in [facility] and look for a) attending/consulting TRH provider listed and b) the [facility] team listed 2. Log into [email or web address] and use [place]'s universal password to access. If you do not have the password, please contact the hospital operator. 3. Locate the [facility] provider you are looking for under [facility] and page to a number that you can be directly reached. 4. If you still have difficulty reaching the provider, please page the [facility] (Director on Call) for the Hospitalists listed on amion for assistance.

## 2020-10-18 NOTE — TOC Progression Note (Signed)
Transition of Care [facility]) - Progression Note    Patient Details  Name: Louis Harrison MRN: [id number] Date of Birth: [birth year]  Transit[facility] of Care [facility]) CM/SW Contact  Zigmund Daniel Dorian Pod, RN Phone Number:[phone] 10/18/2020, 10:39 AM  Clinical Narrative:    OT-SNF and PT-[facility] w/ supervision. Pt not conversing and has a history of dementia. TOC RN spoke with son Evett) concerning the recommendations and potential discharge plans once pt is stable. Currently undergoing several medication changes due to pt's overdiuresing and combativeness. Caregiver son has indicated SNF at this time and does not feel pt is safe enough to return home with [facility] services. Also discussed how to pursue LTC with the SNF facility if pt continues to need that level of care. Gwyndolyn Saxon receptive to [facility] for SNF placement but indicated pt may have a psychiatrist evaluation while here at the hospital.  [facility] RN will continue to monitor pt's discharge needs and address accordingly.  Expected Discharge Plan: [place] Barriers to Discharge: Continued Medical Work up  Expected Discharge Plan and Services Expected Discharge Plan: [place] In-house Referral: Clinical Social Work Discharge Planning Services: CM Consult Post Acute Care Choice: [place] arrangements for the past 2 months: Single Family Home                                       Social Determinants of Health (SDOH) Interventions    Readmission Risk Interventions No flowsheet data found.

## 2020-10-18 NOTE — Progress Notes (Signed)
[facility] Cardiology [facility] Encounter Note  Patient: Louis Harrison / Admit Date: 10/15/2020 / Date of Encounter: 10/18/2020, 7:48 AM   Subjective: 12/25.  Overall patient has been active throughout the evening with no specific complaints.  Patient cannot converse well about his symptoms and/or health at this time.  Telemetry has continued to show sinus bradycardia with no other significant rhythm disturbances.  Troponin is 70/82/93 consistent with either demand ischemia or kidney dysfunction without evidence of acute coronary syndrome.  Patient has had acute on chronic systolic dysfunction heart failure with some bibasilar Rales unchanged from before but concerns for chronic kidney disease now with only 500 cc net output and continued trace lower extremity edema.  12/26.  Patient still not conversing well and unable to get any history from overnight.  Telemetry still showing sinus bradycardia.  Glomerular filtration rate stable at 38 with some bibasilar crackles by exam.  Troponin continued to be flat with the latest being 79 and no current evidence of apparent anginal symptoms or acute coronary syndrome.  Hypertension control appears reasonable compared to yesterday and possibly improvement.  There is concerns of possible worsening chronic kidney disease and overdiuresis and therefore may consider the possibility of changing intravenous furosemide to torsemide orally.  Unfortunately having the patient take his medications in the future may be an issue  Echocardiogram showing mild apical hypokinesis with ejection fraction of 45% and some mitral and tricuspid regurgitation  Review of Systems: Cannot assess   objective: Telemetry: Sinus bradycardia Physical Exam: Blood pressure (!) 150/91, pulse (!) 56, temperature 98 F (36.7 C), temperature source Oral, resp. rate 18, height 5\' 11"  (1.803 m), weight 83.2 kg, SpO2 91 %. Body mass index is 25.59 kg/m. General: Well developed, well  nourished, in no acute distress. Head: Normocephalic, atraumatic, sclera non-icteric, no xanthomas, nares are without discharge. Neck: No apparent masses Lungs: Normal respirations with few wheezes, no rhonchi, no rales , bibasilar crackles   Heart: Regular rate and rhythm, normal S1 S2, no murmur, no rub, no gallop, PMI is normal size and placement, carotid upstroke normal without bruit, jugular venous pressure normal Abdomen: Soft, non-tender, non-distended with normoactive bowel sounds. No hepatosplenomegaly. Abdominal aorta is normal size without bruit Extremities: N trace edema, no clubbing, no cyanosis, no ulcers,  Peripheral: 2+ radial, 2+ femoral, 2+ dorsal pedal pulses Neuro: Not alert and oriented. Moves all extremities spontaneously. Psych: Does not responds to questions appropriately with a normal affect.   Intake/Output Summary (Last 24 hours) at 10/18/2020 0748 Last data filed at 10/17/2020 2029 Gross per 24 hour  Intake 0 ml  Output 2000 ml  Net -2000 ml    Inpatient Medications:  . aspirin EC  81 mg Oral Daily  . atenolol  25 mg Oral Daily  . azithromycin  500 mg Oral Daily  . budesonide (PULMICORT) nebulizer solution  0.25 mg Nebulization BID  . dextromethorphan-guaiFENesin  1 tablet Oral BID  . enoxaparin (LOVENOX) injection  40 mg Subcutaneous Q24H  . furosemide  40 mg Intravenous Q12H  . ipratropium-albuterol  3 mL Nebulization TID  . isosorbide mononitrate  30 mg Oral Daily  . levothyroxine  125 mcg Oral Q0600  . lisinopril  5 mg Oral Daily  . pantoprazole  40 mg Oral Daily  . QUEtiapine  25 mg Oral QHS  . simvastatin  20 mg Oral Daily  . sodium chloride flush  3 mL Intravenous Q12H   Infusions:  . sodium chloride  Labs: Recent Labs    10/15/20 1115 10/17/20 0528  NA 143 140  K 4.4 4.2  CL 108 102  CO2 26 27  GLUCOSE 101* 98  BUN 27* 31*  CREATININE 1.92* 1.69*  CALCIUM 8.4* 8.7*   Recent Labs    10/15/20 1115  AST 19  ALT 17   ALKPHOS 58  BILITOT 1.5*  PROT 5.8*  ALBUMIN 3.5   Recent Labs    10/15/20 1343 10/17/20 0713  WBC 7.8 9.5  NEUTROABS 6.2  --   HGB 15.6 17.7*  HCT 47.8 53.7*  MCV 104.8* 102.5*  PLT 110* 125*   No results for input(s): CKTOTAL, CKMB, TROPONINI in the last 72 hours. Invalid input(s): POCBNP No results for input(s): HGBA1C in the last 72 hours.   Weights: Filed Weights   10/15/20 1109 10/17/20 0350  Weight: 86.2 kg 83.2 kg     Radiology/Studies:  CT Head Wo Contrast  Result Date: 10/15/2020 CLINICAL DATA:  Mental status change.  History of dementia. EXAM: CT HEAD WITHOUT CONTRAST TECHNIQUE: Contiguous axial images were obtained from the base of the skull through the vertex without intravenous contrast. COMPARISON:  CT head 07/31/2014. FINDINGS: Brain: There is no evidence of acute intracranial hemorrhage, mass lesion, brain edema or extra-axial fluid collection. Mildly progressive atrophy with prominence of the ventricles and subarachnoid spaces. There is patchy low-density in the periventricular white matter bilaterally, likely due to chronic small vessel ischemic changes. There is no CT evidence of acute cortical infarction. Vascular:  No hyperdense vessel identified. Skull: Negative for fracture or focal lesion. Sinuses/Orbits: The visualized paranasal sinuses and mastoid air cells are clear. No orbital abnormalities are seen. Other: None. IMPRESSION: 1. No acute intracranial findings. 2. Mildly progressive atrophy and chronic small vessel ischemic changes. Electronically Signed   By: Richardean Sale M.D.   On: 10/15/2020 11:46   DG Chest Port 1 View  Result Date: 10/15/2020 CLINICAL DATA:  Syncopal episodes.  Multiple falls.  Dementia. EXAM: PORTABLE CHEST 1 VIEW COMPARISON:  Radiographs 03/12/2019 and 11/14/2017.  CT 12/20/2017. FINDINGS: 1120 hours. There are significantly lower lung volumes. The heart size and mediastinal contours are stable with aortic atherosclerosis.  There are new patchy airspace opacities at both lung bases associated with poor definition of the pulmonary vasculature. No large pleural effusion, confluent airspace opacity or pneumothorax identified. Telemetry leads overlie the chest. No acute osseous findings. IMPRESSION: Lower lung volumes with new patchy bibasilar airspace opacities which could reflect viral infection or edema. Followup PA and lateral views recommended when the patient is able. Electronically Signed   By: Richardean Sale M.D.   On: 10/15/2020 11:44   DG Humerus Left  Result Date: 10/15/2020 CLINICAL DATA:  Frequent falls.  Left arm bruising.  Dementia. EXAM: LEFT HUMERUS - 2+ VIEW COMPARISON:  None. FINDINGS: The mineralization and alignment are normal. There is no evidence of acute fracture or dislocation. Minor degenerative changes at the shoulder and elbow for age. No foreign body or focal soft tissue swelling identified. IMPRESSION: No acute findings. Minor degenerative changes. Electronically Signed   By: Richardean Sale M.D.   On: 10/15/2020 11:41   ECHOCARDIOGRAM COMPLETE  Result Date: 10/16/2020    ECHOCARDIOGRAM REPORT   Patient Name:   JADIS MIKA Date of Exam: 10/16/2020 Medical Rec #:  [id number]          Height:       71.0 in Accession #:    [id number]  Weight:       190.0 lb Date of Birth:  1927-10-30           BSA:          2.063 m Patient Age:    86 years           BP:           158/86 mmHg Patient Gender: M                  HR:           57 bpm. Exam Location:  [facility] Procedure: 2D Echo, Color Doppler and Cardiac Doppler Indications:     I50.31 CHF-Acute Diastolic  History:         Patient has no prior history of Echocardiogram examinations.                  Previous Myocardial Infarction, COPD; Risk Factors:Hypertension                  and Dyslipidemia.  Sonographer:     Charmayne Sheer RDCS (AE) Referring Phys:  [id number] Collier Bullock Diagnosing Phys: Serafina Royals MD  Sonographer Comments: No subcostal  window. Image acquisition challenging due to COPD and Image acquisition challenging due to respiratory motion. IMPRESSIONS  1. Left ventricular ejection fraction, by estimation, is 45 to 50%. The left ventricle has mildly decreased function. The left ventricle demonstrates regional wall motion abnormalities (see scoring diagram/findings for description). Left ventricular diastolic parameters were normal.  2. Right ventricular systolic function is normal. The right ventricular size is normal.  3. Left atrial size was mildly dilated.  4. The mitral valve is normal in structure. Mild mitral valve regurgitation.  5. The aortic valve is normal in structure. Aortic valve regurgitation is not visualized. FINDINGS  Left Ventricle: Left ventricular ejection fraction, by estimation, is 45 to 50%. The left ventricle has mildly decreased function. The left ventricle demonstrates regional wall motion abnormalities. Mild hypokinesis of the left ventricular, apical apical segment. The left ventricular internal cavity size was normal in size. There is no left ventricular hypertrophy. Left ventricular diastolic parameters were normal. Right Ventricle: The right ventricular size is normal. No increase in right ventricular wall thickness. Right ventricular systolic function is normal. Left Atrium: Left atrial size was mildly dilated. Right Atrium: Right atrial size was normal in size. Pericardium: There is no evidence of pericardial effusion. Mitral Valve: The mitral valve is normal in structure. Mild mitral valve regurgitation. MV peak gradient, 1.2 mmHg. The mean mitral valve gradient is 0.0 mmHg. Tricuspid Valve: The tricuspid valve is normal in structure. Tricuspid valve regurgitation is mild. Aortic Valve: The aortic valve is normal in structure. Aortic valve regurgitation is not visualized. Aortic valve mean gradient measures 3.0 mmHg. Aortic valve peak gradient measures 4.2 mmHg. Aortic valve area, by VTI measures 2.45 cm.  Pulmonic Valve: The pulmonic valve was normal in structure. Pulmonic valve regurgitation is trivial. Aorta: The aortic root and ascending aorta are structurally normal, with no evidence of dilitation. IAS/Shunts: No atrial level shunt detected by color flow Doppler.  LEFT VENTRICLE PLAX 2D LVIDd:         4.96 cm  Diastology LVIDs:         3.26 cm  LV e' lateral:   6.09 cm/s LV PW:         0.97 cm  LV E/e' lateral: 7.8 LV IVS:        0.89 cm LVOT diam:  2.10 cm LV SV:         49 LV SV Index:   24 LVOT Area:     3.46 cm  RIGHT VENTRICLE RV Basal diam:  3.48 cm LEFT ATRIUM             Index       RIGHT ATRIUM           Index LA diam:        4.20 cm 2.04 cm/m  RA Area:     14.70 cm LA Vol (A2C):   66.4 ml 32.18 ml/m RA Volume:   32.80 ml  15.90 ml/m LA Vol (A4C):   43.8 ml 21.23 ml/m LA Biplane Vol: 54.7 ml 26.51 ml/m  AORTIC VALVE                   PULMONIC VALVE AV Area (Vmax):    2.41 cm    PV Vmax:       0.89 m/s AV Area (Vmean):   2.04 cm    PV Vmean:      59.200 cm/s AV Area (VTI):     2.45 cm    PV VTI:        0.163 m AV Vmax:           103.00 cm/s PV Peak grad:  3.2 mmHg AV Vmean:          76.900 cm/s PV Mean grad:  2.0 mmHg AV VTI:            0.201 m AV Peak Grad:      4.2 mmHg AV Mean Grad:      3.0 mmHg LVOT Vmax:         71.80 cm/s LVOT Vmean:        45.200 cm/s LVOT VTI:          0.142 m LVOT/AV VTI ratio: 0.71  AORTA Ao Root diam: 3.50 cm MITRAL VALVE MV Area (PHT): 2.59 cm    SHUNTS MV Peak grad:  1.2 mmHg    Systemic VTI:  0.14 m MV Mean grad:  0.0 mmHg    Systemic Diam: 2.10 cm MV Vmax:       0.55 m/s MV Vmean:      33.6 cm/s MV Decel Time: 293 msec MV E velocity: 47.60 cm/s MV A velocity: 39.80 cm/s MV E/A ratio:  1.20 Serafina Royals MD Electronically signed by Serafina Royals MD Signature Date/Time: 10/16/2020/2:47:53 PM    Final      Assessment and Recommendation  86 y.o. 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  With diuresis slowing and urine output difficult to assess would consider the possibility of changing to oral torsemide to reduce the possibility of worsening acute kidney injury and/or dehydration.   2.  Continue atenolol for heart rate control and blood pressure control despite some bradycardia which appears to be asymptomatic 3.  Continuation of hydralazine isosorbide lisinopril combination for hypertension control and mild cardiomyopathy in the setting of chronic kidney disease and would consider discontinuation of lisinopril if patient's kidney dysfunction worsens by lab work but currently  has been relatively stable 4.  No further intervention of minimal elevation of troponin more consistent with demand ischemia 5.  Begin ambulation and physical rehabilitation as able and would consider the possibility of 40 mg of torsemide as outpatient medication if the patient will take  Signed, Serafina Royals M.D. FACC

## 2020-10-19 ENCOUNTER — Inpatient Hospital Stay: Payer: Medicare Other

## 2020-10-19 DIAGNOSIS — Z515 Encounter for palliative care: Secondary | ICD-10-CM | POA: Diagnosis not present

## 2020-10-19 DIAGNOSIS — G309 Alzheimer's disease, unspecified: Secondary | ICD-10-CM | POA: Diagnosis not present

## 2020-10-19 DIAGNOSIS — N184 Chronic kidney disease, stage 4 (severe): Secondary | ICD-10-CM | POA: Diagnosis not present

## 2020-10-19 DIAGNOSIS — Z7189 Other specified counseling: Secondary | ICD-10-CM

## 2020-10-19 DIAGNOSIS — J411 Mucopurulent chronic bronchitis: Secondary | ICD-10-CM | POA: Diagnosis not present

## 2020-10-19 DIAGNOSIS — I5031 Acute diastolic (congestive) heart failure: Secondary | ICD-10-CM | POA: Diagnosis not present

## 2020-10-19 DIAGNOSIS — J9610 Chronic respiratory failure, unspecified whether with hypoxia or hypercapnia: Secondary | ICD-10-CM | POA: Diagnosis not present

## 2020-10-19 LAB — BASIC METABOLIC PANEL
Anion gap: 15 (ref 5–15)
BUN: 46 mg/dL — ABNORMAL HIGH (ref 8–23)
CO2: 28 mmol/L (ref 22–32)
Calcium: 8.7 mg/dL — ABNORMAL LOW (ref 8.9–10.3)
Chloride: 98 mmol/L (ref 98–111)
Creatinine, Ser: 2.7 mg/dL — ABNORMAL HIGH (ref 0.61–1.24)
GFR, Estimated: 22 mL/min — ABNORMAL LOW (ref >60)
Glucose, Bld: 100 mg/dL — ABNORMAL HIGH (ref 70–99)
Potassium: 4.4 mmol/L (ref 3.5–5.1)
Sodium: 141 mmol/L (ref 135–145)

## 2020-10-19 LAB — CBC
HCT: 54.4 % — ABNORMAL HIGH (ref 39.0–52.0)
Hemoglobin: 18.1 g/dL — ABNORMAL HIGH (ref 13.0–17.0)
MCH: 34.2 pg — ABNORMAL HIGH (ref 26.0–34.0)
MCHC: 33.3 g/dL (ref 30.0–36.0)
MCV: 102.6 fL — ABNORMAL HIGH (ref 80.0–100.0)
Platelets: 132 10*3/uL — ABNORMAL LOW (ref 150–400)
RBC: 5.3 MIL/uL (ref 4.22–5.81)
RDW: 13.9 % (ref 11.5–15.5)
WBC: 10 10*3/uL (ref 4.0–10.5)
nRBC: 0 % (ref 0.0–0.2)

## 2020-10-19 MED ORDER — QUETIAPINE FUMARATE 25 MG PO TABS
50.0000 mg | ORAL_TABLET | Freq: Every day | ORAL | Status: DC
  Administered 2020-10-19 – 2020-10-26 (×7): 50 mg via ORAL
  Filled 2020-10-19 (×7): qty 2

## 2020-10-19 MED ORDER — SODIUM CHLORIDE 0.9 % IV SOLN: INTRAVENOUS | Status: AC

## 2020-10-19 NOTE — Progress Notes (Addendum)
PROGRESS NOTE    Louis Harrison   CZY:[id number]  DOB: 1928-02-24  PCP: [facility] [place]    DOA: 10/15/2020 LOS: 4   Brief Narrative   Louis Harrison is a 86 y.o. male with medical history significant for dementia, COPD with chronic respiratory failure, chronic diastolic dysfunction CHF and hypertension who was brought into the ER by EMS for evaluation of mental status changes.  Patient's son provided history that patient's dementia seems to be recently worsening over the past few weeks, with worsening visual hallucinations and agitations, refusing care, more difficult to reorient.  Patient lives alone son concerned this may no longer be safe.  In addition, son had noted progressive shortness of breath with exertion and cough which had become productive of yellow sputum.  Evaluation in the ED revealed elevated BNP of 1311, mild troponin elevation 65 >> 70, normal lactic acid, no leukocytosis, thrombocytopenia with platelets 110k..  Chest x-ray showed bibasilar airspace opacities concerning for infection or edema.  X-ray of left humerus was negative.  Admitted to hospitalist service with cardiology consulted.  Undergoing diuresis for acute on chronic diastolic CHF undergoing IV diuresis and being treated for mild acute exacerbation of COPD with relatively stable chronic respiratory failure (uses 2 L/min at baseline).   Patient had significant behavioral disturbances since admission, very combative and difficult to manage.  Psychiatry was consulted and has ordered as needed IV Haldol.     Assessment & Plan   Principal Problem:   Dementia ([place]) Active Problems:   Chronic respiratory failure (HCC)   COPD (chronic obstructive pulmonary disease) (HCC)   Hypertension   Coronary artery disease   Acute CHF (congestive heart failure) (HCC)   CKD (chronic kidney disease), stage IV (HCC)   Hypothyroidism   Acute on chronic diastolic CHF -present on admission with  dyspnea on exertion, elevated BNP and x-ray findings with pulmonary edema.  Cardiology is consulted.   12/27 Diuretics on hold due to worsening renal function. Initially given IV Lasix, then oral torsemide 40 mg daily with improved breathing. Continue atenolol.   Strict I/O's and daily weights.   Low-sodium diet with fluid restriction.   BP control as below Net IO Since Admission: -3,498.59 mL [10/19/20 1816] (I/O's inaccurate due to urinary incontinence)   Dementia with behavioral disturbances -Per son, patient lives alone and has had more difficulties with worsening dementia including hallucinations, combativeness, refusal of care, taking off oxygen.  He has also had frequent falls and unsafe to remain home alone without 24/7 supervision. 12/27 - very somnolent, no PRN meds needed for agitation but schedule Seroquel doses yesterday were given close together --Psychiatry consulted --Seroquel 50 mg in AM and reduce bedtime dose to 50 mg --IV Haldol as needed --TOC consulted for placement --PT and OT evaluations  --Palliative care consulted for [place] discussions --Serial EKGs to monitor QTC    Acute kidney injury -Cr bumped up to 2.70 this AM, due to no oral intake with delirium and somnolence.   AKI present on admission with creatinine 1.92.   Baseline CKD stage IV.   --Gentle IV hydration given no PO intake.  Monitor volume status closely. Monitor BMP.   Avoid nephrotoxins and hypotension.     COPD with acute exacerbation / Chronic respiratory failure  Exacerbation resolved, no wheezing, aeration improved. -at baseline patient uses 2 L/min nasal cannula oxygen.  Continue scheduled and as needed nebs per orders, inhaled corticosteroid.  Zithromax.  Systemic steroids held agitation and combativeness.  Elevated troponin -mild and flat trend without any active chest pain.  This likely secondary to demand ischemia in the setting of decompensated CHF.   Hypertension -chronic,  uncontrolled on admission.  Continue lisinopril.  As needed hydralazine if uncontrolled.   History of CAD -stable with no active chest pain.  Continue lisinopril, Imdur, aspirin and simvastatin   Hypothyroidism -continue Synthroid.  TSH elevated at 6.911, but free T4 normal.    DVT prophylaxis: enoxaparin (LOVENOX) injection 30 mg Start: 10/18/20 2000   Diet:  Diet Orders (From admission, onward)    Start     Ordered   10/15/20 1545  Diet 2 gram sodium Room service appropriate? Yes; Fluid consistency: Thin  Diet effective now       Question Answer Comment  Room service appropriate? Yes   Fluid consistency: Thin      10/15/20 1545            Code Status: DNR    Subjective 10/19/20    Patient seen this AM, son Nada Boozer had already left.  Pt sleeping comfortably.  Seems Seroquel doing okay for his agitation but quite somnolent.   Disposition Plan & Communication   Status is: Inpatient  Remains inpatient appropriate because:IV treatments appropriate due to intensity of illness or inability to take PO.  Worsening renal failure, no PO intake, behavioral disturbances in setting of dementia.   Dispo: The patient is from: Home              Anticipated d/c is to: SNF              Anticipated d/c date is: 3 days              Patient currently is not medically stable to d/c.   Family Communication: spoke with son, Marylyn Ishihara, by phone this afternoon to discuss overall clinical picture and prognosis, dementia being progressive and uncurable condition, need to plan for new living arrangements with 24/7 care.   Consults, Procedures, Significant Events   Consultants:   Cardiology  Palliative care  Procedures:   Echo EF 45-50%  Antimicrobials:  Anti-infectives (From admission, onward)   Start     Dose/Rate Route Frequency Ordered Stop   10/15/20 1600  azithromycin (ZITHROMAX) tablet 500 mg        500 mg Oral Daily 10/15/20 1554 10/19/20 1056         Objective   Vitals:    10/19/20 0806 10/19/20 0845 10/19/20 1209 10/19/20 1622  BP: (!) 146/72  138/66 106/62  Pulse: (!) 50 (!) 55 (!) 53 (!) 57  Resp: 19  19 18   Temp: 97.7 F (36.5 C)  97.9 F (36.6 C) 97.9 F (36.6 C)  TempSrc:      SpO2: 92%  93%   Weight:      Height:        Intake/Output Summary (Last 24 hours) at 10/19/2020 1816 Last data filed at 10/19/2020 1655 Gross per 24 hour  Intake 452.41 ml  Output --  Net 452.41 ml   Filed Weights   10/15/20 1109 10/17/20 0350 10/19/20 0442  Weight: 86.2 kg 83.2 kg 78.3 kg    Physical Exam:  General exam: Sleeping comfortably, no acute distress Respiratory system: CTAB diminished bases, normal respiratory effort, on nasal cannula oxygen Cardiovascular system: normal S1/S2, RRR Gastrointestinal system: soft, NT, ND, +bowel sounds Central nervous system: not completed as patient somnolent   Labs   Data Reviewed: I have personally reviewed following labs and  imaging studies  CBC: Recent Labs  Lab 10/15/20 1343 10/17/20 0713 10/18/20 0854 10/19/20 0415  WBC 7.8 9.5 10.9* 10.0  NEUTROABS 6.2  --   --   --   HGB 15.6 17.7* 17.4* 18.1*  HCT 47.8 53.7* 53.5* 54.4*  MCV 104.8* 102.5* 103.1* 102.6*  PLT 110* 125* 130* 175*   Basic Metabolic Panel: Recent Labs  Lab 10/15/20 1115 10/17/20 0528 10/18/20 0854 10/19/20 0415  NA 143 140 140 141  K 4.4 4.2 4.3 4.4  CL 108 102 97* 98  CO2 26 27 32 28  GLUCOSE 101* 98 93 100*  BUN 27* 31* 32* 46*  CREATININE 1.92* 1.69* 1.89* 2.70*  CALCIUM 8.4* 8.7* 8.7* 8.7*  MG  --   --  2.0  --    GFR: Estimated Creatinine Clearance: 19.4 mL/min (A) (by C-G formula based on SCr of 2.7 mg/dL (H)). Liver Function Tests: Recent Labs  Lab 10/15/20 1115  AST 19  ALT 17  ALKPHOS 58  BILITOT 1.5*  PROT 5.8*  ALBUMIN 3.5   Recent Labs  Lab 10/15/20 1115  LIPASE 19   No results for input(s): AMMONIA in the last 168 hours. Coagulation Profile: Recent Labs  Lab 10/15/20 1115  INR 1.1    Cardiac Enzymes: No results for input(s): CKTOTAL, CKMB, CKMBINDEX, TROPONINI in the last 168 hours. BNP (last 3 results) No results for input(s): PROBNP in the last 8760 hours. HbA1C: No results for input(s): HGBA1C in the last 72 hours. CBG: No results for input(s): GLUCAP in the last 168 hours. Lipid Profile: No results for input(s): CHOL, HDL, LDLCALC, TRIG, CHOLHDL, LDLDIRECT in the last 72 hours. Thyroid Function Tests: Recent Labs    10/17/20 0528  TSH 6.911*  FREET4 1.05   Anemia Panel: Recent Labs    10/17/20 0528  VITAMINB12 334   Sepsis Labs: Recent Labs  Lab 10/15/20 1115 10/15/20 1343 10/17/20 0528  PROCALCITON  --   --  <0.10  LATICACIDVEN 1.6 1.4  --     Recent Results (from the past 240 hour(s))  Resp Panel by RT-PCR (Flu A&B, Covid) Nasopharyngeal Swab     Status: None   Collection Time: 10/15/20 11:38 AM   Specimen: Nasopharyngeal Swab; Nasopharyngeal(NP) swabs in vial transport medium  Result Value Ref Range Status   SARS Coronavirus 2 by RT PCR NEGATIVE NEGATIVE Final    Comment: (NOTE) SARS-CoV-2 target nucleic acids are NOT DETECTED.  The SARS-CoV-2 RNA is generally detectable in upper respiratory specimens during the acute phase of infection. The lowest concentration of SARS-CoV-2 viral copies this assay can detect is 138 copies/mL. A negative result does not preclude SARS-Cov-2 infection and should not be used as the sole basis for treatment or other patient management decisions. A negative result may occur with  improper specimen collection/handling, submission of specimen other than nasopharyngeal swab, presence of viral mutation(s) within the areas targeted by this assay, and inadequate number of viral copies(<138 copies/mL). A negative result must be combined with clinical observations, patient history, and epidemiological information. The expected result is Negative.  Fact Sheet for Patients:   EntrepreneurPulse.com.au  Fact Sheet for Healthcare Providers:  IncredibleEmployment.be  This test is no t yet approved or cleared by the [place] FDA and  has been authorized for detection and/or diagnosis of SARS-CoV-2 by FDA under an Emergency Use Authorization (EUA). This EUA will remain  in effect (meaning this test can be used) for the duration of the COVID-19 declaration under Section  564(b)(1) of the Act, 21 U.S.C.section 360bbb-3(b)(1), unless the authorization is terminated  or revoked sooner.       Influenza A by PCR NEGATIVE NEGATIVE Final   Influenza B by PCR NEGATIVE NEGATIVE Final    Comment: (NOTE) The Xpert Xpress SARS-CoV-2/FLU/RSV plus assay is intended as an aid in the diagnosis of influenza from Nasopharyngeal swab specimens and should not be used as a sole basis for treatment. Nasal washings and aspirates are unacceptable for Xpert Xpress SARS-CoV-2/FLU/RSV testing.  Fact Sheet for Patients: EntrepreneurPulse.com.au  Fact Sheet for Healthcare Providers: IncredibleEmployment.be  This test is not yet approved or cleared by the [place] FDA and has been authorized for detection and/or diagnosis of SARS-CoV-2 by FDA under an Emergency Use Authorization (EUA). This EUA will remain in effect (meaning this test can be used) for the duration of the COVID-19 declaration under Section 564(b)(1) of the Act, 21 U.S.C. section 360bbb-3(b)(1), unless the authorization is terminated or revoked.  Performed at [facility], [address] [place], [place] [address]       Imaging Studies   US RENAL  Result Date: 2020/10/21 CLINICAL DATA:  Acute kidney injury EXAM: RENAL / URINARY TRACT ULTRASOUND COMPLETE COMPARISON:  None. FINDINGS: Right Kidney: Renal measurements: 10.3 x 4.8 x 4.7 cm = volume: 121 mL. Echogenic renal parenchyma. 3.7 x 4.0 x 3.3 cm interpolar right renal  cyst with a single incomplete septation, benign. No hydronephrosis. Left Kidney: Was not imaged.  Patient aborted the study. Bladder: Appears normal for degree of bladder distention. Other: None. IMPRESSION: Left kidney not imaged due to patient request. 4.0 cm minimally complex right renal cyst, benign. No hydronephrosis. Electronically Signed   By: Julian Hy M.D.   On: 10/21/2020 11:32   DG Chest Port 1 View  Result Date: Oct 21, 2020 CLINICAL DATA:  Altered mental status EXAM: PORTABLE CHEST 1 VIEW COMPARISON:  10/15/2020 FINDINGS: Mild bilateral lower lobe opacities, atelectasis versus pneumonia. No pleural effusion or pneumothorax. The heart is top-normal in size.  Thoracic aortic atherosclerosis. IMPRESSION: Mild bilateral lower lobe opacities, atelectasis versus pneumonia. Electronically Signed   By: Julian Hy M.D.   On: 10/21/20 08:26     Medications   Scheduled Meds: . aspirin EC  81 mg Oral Daily  . atenolol  25 mg Oral Daily  . budesonide (PULMICORT) nebulizer solution  0.25 mg Nebulization BID  . dextromethorphan-guaiFENesin  1 tablet Oral BID  . enoxaparin (LOVENOX) injection  30 mg Subcutaneous Q24H  . ipratropium-albuterol  3 mL Nebulization BID  . isosorbide mononitrate  30 mg Oral Daily  . levothyroxine  125 mcg Oral Q0600  . pantoprazole  40 mg Oral Daily  . QUEtiapine  100 mg Oral QHS  . QUEtiapine  50 mg Oral Daily  . simvastatin  20 mg Oral Daily  . sodium chloride flush  3 mL Intravenous Q12H   Continuous Infusions: . sodium chloride    . sodium chloride 75 mL/hr at 2020/10/21 1008       LOS: 4 days    Time spent: 25 minutes with greater than 50% spent in coordination of care and direct patient contact.    Ezekiel Slocumb, DO [facility]  October 21, 2020, 6:16 PM    If 7PM-7AM, please contact night-coverage. How to contact the [facility] Attending or Consulting provider [place] or covering provider during after hours [place], for this  patient?    1. Check the care team in [facility] and look for a)  attending/consulting [facility] provider listed and b) the [facility] team listed 2. Log into [email or web address] and use [place]'s universal password to access. If you do not have the password, please contact the hospital operator. 3. Locate the [facility] provider you are looking for under [facility] and page to a number that you can be directly reached. 4. If you still have difficulty reaching the provider, please page the [facility] (Director on Call) for the Hospitalists listed on amion for assistance.

## 2020-10-19 NOTE — Plan of Care (Signed)
  Problem: Clinical Measurements: Goal: Ability to maintain clinical measurements within normal limits will improve Outcome: Progressing Goal: Will remain free from infection Outcome: Progressing Goal: Respiratory complications will improve Outcome: Progressing   Problem: Education: Goal: Knowledge of General Education information will improve Description: Including pain rating scale, medication(s)/side effects and non-pharmacologic comfort measures Outcome: Not Progressing   Problem: Health Behavior/Discharge Planning: Goal: Ability to manage health-related needs will improve Outcome: Not Progressing   Problem: Coping: Goal: Level of anxiety will decrease Outcome: Not Progressing   Pt experiencing some agitation at beginning of shift, becoming more calm with lights down and music in room.  RN will continue to monitor.

## 2020-10-19 NOTE — Progress Notes (Signed)
[facility] Cardiology [facility] Encounter Note  Patient: Louis Harrison / Admit Date: 10/15/2020 / Date of Encounter: 10/19/2020, 8:27 AM   Subjective: 12/25.  Overall patient has been active throughout the evening with no specific complaints.  Patient cannot converse well about his symptoms and/or health at this time.  Telemetry has continued to show sinus bradycardia with no other significant rhythm disturbances.  Troponin is 70/82/93 consistent with either demand ischemia or kidney dysfunction without evidence of acute coronary syndrome.  Patient has had acute on chronic systolic dysfunction heart failure with some bibasilar Rales unchanged from before but concerns for chronic kidney disease now with only 500 cc net output and continued trace lower extremity edema.  12/26.  Patient still not conversing well and unable to get any history from overnight.  Telemetry still showing sinus bradycardia.  Glomerular filtration rate stable at 38 with some bibasilar crackles by exam.  Troponin continued to be flat with the latest being 79 and no current evidence of apparent anginal symptoms or acute coronary syndrome.  Hypertension control appears reasonable compared to yesterday and possibly improvement.  There is concerns of possible worsening chronic kidney disease and overdiuresis and therefore may consider the possibility of changing intravenous furosemide to torsemide orally.  Unfortunately having the patient take his medications in the future may be an issue   12/27.  Patient still not conversing at all and unable to discuss any symptoms or issues.  Patient did have a discontinuation of IV Lasix but kidney injury slightly worse with a glomerular filtration rate of 22 and therefore discontinuation of all diuretics would be appropriate.  In addition to that further evaluation of the possibility pulmonary edema resolution due to difficulty in having patient take deep breaths and truly assess.  The  patient continues to have sinus bradycardia by telemetry.  Echocardiogram showing mild apical hypokinesis with ejection fraction of 45% and some mitral and tricuspid regurgitation  Review of Systems: Cannot assess   objective: Telemetry: Sinus bradycardia Physical Exam: Blood pressure (!) 146/72, pulse (!) 50, temperature 97.7 F (36.5 C), resp. rate 19, height 5\' 11"  (1.803 m), weight 78.3 kg, SpO2 92 %. Body mass index is 24.09 kg/m. General: Well developed, well nourished, in no acute distress. Head: Normocephalic, atraumatic, sclera non-icteric, no xanthomas, nares are without discharge. Neck: No apparent masses Lungs: Normal respirations with few wheezes, no rhonchi, no rales , bibasilar crackles   Heart: Regular rate and rhythm, normal S1 S2, no murmur, no rub, no gallop, PMI is normal size and placement, carotid upstroke normal without bruit, jugular venous pressure normal Abdomen: Soft, non-tender, non-distended with normoactive bowel sounds. No hepatosplenomegaly. Abdominal aorta is normal size without bruit Extremities: N trace edema, no clubbing, no cyanosis, no ulcers,  Peripheral: 2+ radial, 2+ femoral, 2+ dorsal pedal pulses Neuro: Not alert and oriented. Moves all extremities spontaneously. Psych: Does not responds to questions appropriately with a normal affect.   Intake/Output Summary (Last 24 hours) at 10/19/2020 0827 Last data filed at 10/18/2020 1500 Gross per 24 hour  Intake 0 ml  Output 1250 ml  Net -1250 ml    Inpatient Medications:  . aspirin EC  81 mg Oral Daily  . atenolol  25 mg Oral Daily  . azithromycin  500 mg Oral Daily  . budesonide (PULMICORT) nebulizer solution  0.25 mg Nebulization BID  . dextromethorphan-guaiFENesin  1 tablet Oral BID  . enoxaparin (LOVENOX) injection  30 mg Subcutaneous Q24H  . ipratropium-albuterol  3 mL Nebulization  BID  . isosorbide mononitrate  30 mg Oral Daily  . levothyroxine  125 mcg Oral Q0600  . pantoprazole  40  mg Oral Daily  . QUEtiapine  100 mg Oral QHS  . QUEtiapine  50 mg Oral Daily  . simvastatin  20 mg Oral Daily  . sodium chloride flush  3 mL Intravenous Q12H   Infusions:  . sodium chloride    . sodium chloride      Labs: Recent Labs    10/18/20 0854 10/19/20 0415  NA 140 141  K 4.3 4.4  CL 97* 98  CO2 32 28  GLUCOSE 93 100*  BUN 32* 46*  CREATININE 1.89* 2.70*  CALCIUM 8.7* 8.7*  MG 2.0  --    No results for input(s): AST, ALT, ALKPHOS, BILITOT, PROT, ALBUMIN in the last 72 hours. Recent Labs    10/18/20 0854 10/19/20 0415  WBC 10.9* 10.0  HGB 17.4* 18.1*  HCT 53.5* 54.4*  MCV 103.1* 102.6*  PLT 130* 132*   No results for input(s): CKTOTAL, CKMB, TROPONINI in the last 72 hours. Invalid input(s): POCBNP No results for input(s): HGBA1C in the last 72 hours.   Weights: Filed Weights   10/15/20 1109 10/17/20 0350 10/19/20 0442  Weight: 86.2 kg 83.2 kg 78.3 kg     Radiology/Studies:  CT Head Wo Contrast  Result Date: 10/15/2020 CLINICAL DATA:  Mental status change.  History of dementia. EXAM: CT HEAD WITHOUT CONTRAST TECHNIQUE: Contiguous axial images were obtained from the base of the skull through the vertex without intravenous contrast. COMPARISON:  CT head 07/31/2014. FINDINGS: Brain: There is no evidence of acute intracranial hemorrhage, mass lesion, brain edema or extra-axial fluid collection. Mildly progressive atrophy with prominence of the ventricles and subarachnoid spaces. There is patchy low-density in the periventricular white matter bilaterally, likely due to chronic small vessel ischemic changes. There is no CT evidence of acute cortical infarction. Vascular:  No hyperdense vessel identified. Skull: Negative for fracture or focal lesion. Sinuses/Orbits: The visualized paranasal sinuses and mastoid air cells are clear. No orbital abnormalities are seen. Other: None. IMPRESSION: 1. No acute intracranial findings. 2. Mildly progressive atrophy and chronic  small vessel ischemic changes. Electronically Signed   By: Richardean Sale M.D.   On: 10/15/2020 11:46   DG Chest Port 1 View  Result Date: 10/15/2020 CLINICAL DATA:  Syncopal episodes.  Multiple falls.  Dementia. EXAM: PORTABLE CHEST 1 VIEW COMPARISON:  Radiographs 03/12/2019 and 11/14/2017.  CT 12/20/2017. FINDINGS: 1120 hours. There are significantly lower lung volumes. The heart size and mediastinal contours are stable with aortic atherosclerosis. There are new patchy airspace opacities at both lung bases associated with poor definition of the pulmonary vasculature. No large pleural effusion, confluent airspace opacity or pneumothorax identified. Telemetry leads overlie the chest. No acute osseous findings. IMPRESSION: Lower lung volumes with new patchy bibasilar airspace opacities which could reflect viral infection or edema. Followup PA and lateral views recommended when the patient is able. Electronically Signed   By: Richardean Sale M.D.   On: 10/15/2020 11:44   DG Humerus Left  Result Date: 10/15/2020 CLINICAL DATA:  Frequent falls.  Left arm bruising.  Dementia. EXAM: LEFT HUMERUS - 2+ VIEW COMPARISON:  None. FINDINGS: The mineralization and alignment are normal. There is no evidence of acute fracture or dislocation. Minor degenerative changes at the shoulder and elbow for age. No foreign body or focal soft tissue swelling identified. IMPRESSION: No acute findings. Minor degenerative changes. Electronically Signed  By: Richardean Sale M.D.   On: 10/15/2020 11:41   ECHOCARDIOGRAM COMPLETE  Result Date: 10/16/2020    ECHOCARDIOGRAM REPORT   Patient Name:   Louis Harrison Date of Exam: 10/16/2020 Medical Rec #:  [id number]          Height:       71.0 in Accession #:    [id number]         Weight:       190.0 lb Date of Birth:  04-22-1928           BSA:          2.063 m Patient Age:    10 years           BP:           158/86 mmHg Patient Gender: M                  HR:           57 bpm. Exam  Location:  [facility] Procedure: 2D Echo, Color Doppler and Cardiac Doppler Indications:     I50.31 CHF-Acute Diastolic  History:         Patient has no prior history of Echocardiogram examinations.                  Previous Myocardial Infarction, COPD; Risk Factors:Hypertension                  and Dyslipidemia.  Sonographer:     Charmayne Sheer RDCS (AE) Referring Phys:  [id number] Collier Bullock Diagnosing Phys: Serafina Royals MD  Sonographer Comments: No subcostal window. Image acquisition challenging due to COPD and Image acquisition challenging due to respiratory motion. IMPRESSIONS  1. Left ventricular ejection fraction, by estimation, is 45 to 50%. The left ventricle has mildly decreased function. The left ventricle demonstrates regional wall motion abnormalities (see scoring diagram/findings for description). Left ventricular diastolic parameters were normal.  2. Right ventricular systolic function is normal. The right ventricular size is normal.  3. Left atrial size was mildly dilated.  4. The mitral valve is normal in structure. Mild mitral valve regurgitation.  5. The aortic valve is normal in structure. Aortic valve regurgitation is not visualized. FINDINGS  Left Ventricle: Left ventricular ejection fraction, by estimation, is 45 to 50%. The left ventricle has mildly decreased function. The left ventricle demonstrates regional wall motion abnormalities. Mild hypokinesis of the left ventricular, apical apical segment. The left ventricular internal cavity size was normal in size. There is no left ventricular hypertrophy. Left ventricular diastolic parameters were normal. Right Ventricle: The right ventricular size is normal. No increase in right ventricular wall thickness. Right ventricular systolic function is normal. Left Atrium: Left atrial size was mildly dilated. Right Atrium: Right atrial size was normal in size. Pericardium: There is no evidence of pericardial effusion. Mitral Valve: The mitral valve is normal  in structure. Mild mitral valve regurgitation. MV peak gradient, 1.2 mmHg. The mean mitral valve gradient is 0.0 mmHg. Tricuspid Valve: The tricuspid valve is normal in structure. Tricuspid valve regurgitation is mild. Aortic Valve: The aortic valve is normal in structure. Aortic valve regurgitation is not visualized. Aortic valve mean gradient measures 3.0 mmHg. Aortic valve peak gradient measures 4.2 mmHg. Aortic valve area, by VTI measures 2.45 cm. Pulmonic Valve: The pulmonic valve was normal in structure. Pulmonic valve regurgitation is trivial. Aorta: The aortic root and ascending aorta are structurally normal, with no evidence of dilitation. IAS/Shunts: No atrial level  shunt detected by color flow Doppler.  LEFT VENTRICLE PLAX 2D LVIDd:         4.96 cm  Diastology LVIDs:         3.26 cm  LV e' lateral:   6.09 cm/s LV PW:         0.97 cm  LV E/e' lateral: 7.8 LV IVS:        0.89 cm LVOT diam:     2.10 cm LV SV:         49 LV SV Index:   24 LVOT Area:     3.46 cm  RIGHT VENTRICLE RV Basal diam:  3.48 cm LEFT ATRIUM             Index       RIGHT ATRIUM           Index LA diam:        4.20 cm 2.04 cm/m  RA Area:     14.70 cm LA Vol (A2C):   66.4 ml 32.18 ml/m RA Volume:   32.80 ml  15.90 ml/m LA Vol (A4C):   43.8 ml 21.23 ml/m LA Biplane Vol: 54.7 ml 26.51 ml/m  AORTIC VALVE                   PULMONIC VALVE AV Area (Vmax):    2.41 cm    PV Vmax:       0.89 m/s AV Area (Vmean):   2.04 cm    PV Vmean:      59.200 cm/s AV Area (VTI):     2.45 cm    PV VTI:        0.163 m AV Vmax:           103.00 cm/s PV Peak grad:  3.2 mmHg AV Vmean:          76.900 cm/s PV Mean grad:  2.0 mmHg AV VTI:            0.201 m AV Peak Grad:      4.2 mmHg AV Mean Grad:      3.0 mmHg LVOT Vmax:         71.80 cm/s LVOT Vmean:        45.200 cm/s LVOT VTI:          0.142 m LVOT/AV VTI ratio: 0.71  AORTA Ao Root diam: 3.50 cm MITRAL VALVE MV Area (PHT): 2.59 cm    SHUNTS MV Peak grad:  1.2 mmHg    Systemic VTI:  0.14 m MV Mean  grad:  0.0 mmHg    Systemic Diam: 2.10 cm MV Vmax:       0.55 m/s MV Vmean:      33.6 cm/s MV Decel Time: 293 msec MV E velocity: 47.60 cm/s MV A velocity: 39.80 cm/s MV E/A ratio:  1.20 Serafina Royals MD Electronically signed by Serafina Royals MD Signature Date/Time: 10/16/2020/2:47:53 PM    Final      Assessment and Recommendation  86 y.o. 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  Discontinuation of diuretics  at this time due to worsening kidney dysfunction and possible kidney injury with further evaluation by chest x-ray to assess any pulmonary edema requiring additional treatment. 2.  Continue atenolol for heart rate control and blood pressure control despite some bradycardia which appears to be asymptomatic 3.   Okay for continuation hydralazine isosorbide for LV systolic dysfunction and hypertension control but will discontinue lisinopril temporarily due to concerns of acute kidney injury 4.  No further intervention of minimal elevation of troponin more consistent with demand ischemia 5.  Continuation of supportive care dementia and other significant concerns  Signed, Serafina Royals M.D. FACC

## 2020-10-19 NOTE — Consult Note (Signed)
Consultation Note Date: 10/19/2020   Patient Name: Louis Harrison  DOB: [birth year]  MRN: [id number]  Age / Sex: 86 y.o., male  [facility]P: [facility] [place] Referring Physician: Ezekiel Slocumb, DO  Reason for Consultation: Establishing goals of care and Psychosocial/spiritual support  HPI/Patient Profile: 86 y.o. male  admitted on 10/15/2020 with medical history significant for dementia) per family slow progression over the past few years), COPD with chronic respiratory failure, chronic diastolic dysfunction CHF and hypertension who was brought into the ER by EMS for evaluation of mental status changes.   Patient son states that over the last couple of weeks he has had worsening visual hallucinations and agitation which includes refusing care and not wanting people to come into his house to help provide care for him.  He states it has become increasingly difficult to reorient the patient and to make him do the things that he needs to do including wearing his oxygen.    Patient lives alone and because he continues to refuse outside help his son is concerned that it is not safe for him to continue to stay home by himself.  Patient's son states that he has noticed that his father has become increasingly short of breath with exertion and has a chronic cough which is now productive of yellow phlegm.  He does not think he has had any fever or chills.  CT scan of the head without contrast shows no acute intracranial findings.  Mildly progressive atrophic and chronic small vessel ischemic changes.  Chest x-ray shows bibasilar airspace opacities which could reflect viral infection or edema.   Significant behavioral disturbances since admission, very combative and difficult to manage.  Psychiatry was consulted and has ordered as needed IV Haldol.  Today with lethargy, difficult to arouse, poor po intake.   Worsening renal function, Creatine 2.70 Continued failure to thrive.  Patient has a documented desire for a natural death.  Patient does not have medical decision capacity at this time.  Family face treatment option decisions, advanced directive decisions and anticipatory care needs.   Clinical Assessment and Goals of Care:  This NP Wadie Lessen reviewed medical records, received report from team, assessed the patient and then meet at the patient's bedside with his son/ Louis Harrison and [name] by phone # [phone] to discuss diagnosis, prognosis, GOC, EOL wishes disposition and options.   Concept of Palliative Care was introduced as specialized medical care for people and their families living with serious illness.  Ii focuses on providing relief from the symptoms and stress of a serious illness.  The goal is to improve quality of life for both the patient and the family.  Values and goals of care important to patient and family were attempted to be elicited.  Created space and opportunity forfamily to explore thoughts and feelings regarding current medical situation.  Patient's [name] is adamant about "giving his father a chance".  He expresses concern with "I do not want my father drugged up"   A  discussion was had today regarding advanced directives.  Concepts specific to code status, artifical feeding and hydration, continued IV antibiotics and rehospitalization was had.    Education offered on the concept of adult failure to thrive and the limitations of medical interventions to prolong quality of life when the body fails to thrive.  Education offered on the difference between aggressive medical intervention path and a palliative comfort path for this patient at this time in this situation.  Patient's son/HPOA Louis Harrison verbalizes his appreciation for the care his father is getting here in the hospital.  He verbalizes that he remains hopeful for  improvement but realizes that with his father's advanced age and multiple comorbidities decision to shift to a more comfort path may be in the near future.  Family members express the need for "more time" to see if the patient will begin to respond to treatments  MOST form introduced and hard choices booklet left for review.   Natural trajectory and expectations at EOL were discussed.  Questions and concerns addressed. Patient  encouraged to call with questions or concerns.     PMT will continue to support holistically, and assist family in navigating decisions depending on outcomes over the next 24-48 hours     Documented HPOA/Louis Harrison/son    SUMMARY OF RECOMMENDATIONS    Code Status/Advance Care Planning:  DNR   Symptom Management:   Agitation: Seroquel and Haldol as previously written   Delirium precautions  Palliative Prophylaxis:   Aspiration, Bowel Regimen, Delirium Protocol, Frequent Pain Assessment and Oral Care  Additional Recommendations (Limitations, Scope, Preferences):  Full Scope Treatment  Encouraged family to consider DNR/DNI status understanding evidenced based poor outcomes in similar hospitalized patient, as the cause of arrest is likely associated with advanced chronic illness rather than an easily reversible acute cardio-pulmonary event.  Psycho-social/Spiritual:   Desire for further Chaplaincy support:no  Additional Recommendations: Education on Hospice  Prognosis:   Unable to determine  Discharge Planning: To Be Determined      Primary Diagnoses: Present on Admission: . Chronic respiratory failure ([place]) . COPD (chronic obstructive pulmonary disease) ([place]) . Coronary artery disease . Hypertension . Dementia ([place]) . CKD (chronic kidney disease), stage IV ([place]) . Hypothyroidism   I have reviewed the medical record, interviewed the patient and family, and examined the patient. The following aspects are pertinent.  Past Medical  History:  Diagnosis Date  . Cancer [facility])    Larynx cancer.    Marland Kitchen COPD (chronic obstructive pulmonary disease) ([place])   . Heart attack ([place]) 1995  . Hyperlipidemia   . Hypertension   . Renal artery stenosis [facility])    Social History   Socioeconomic History  . Marital status: Widowed    Spouse name: Not on file  . Number of children: Not on file  . Years of education: Not on file  . Highest education level: Not on file  Occupational History  . Not on file  Tobacco Use  . Smoking status: Former Smoker    Packs/day: 1.50    Years: 30.00    Pack years: 45.00    Types: Cigarettes    Quit date: 10/24/1993    Years since quitting: 27.0  . Smokeless tobacco: Never Used  Vaping Use  . Vaping Use: Never used  Substance and Sexual Activity  . Alcohol use: No  . Drug use: No  . Sexual activity: Never    Birth control/protection: Abstinence  Other Topics Concern  . Not on file  Social  History Narrative  . Not on file   Social Determinants of Health   Financial Resource Strain: Not on file  Food Insecurity: Not on file  Transportation Needs: Not on file  Physical Activity: Not on file  Stress: Not on file  Social Connections: Not on file   Family History  Family history unknown: Yes   Scheduled Meds: . aspirin EC  81 mg Oral Daily  . atenolol  25 mg Oral Daily  . azithromycin  500 mg Oral Daily  . budesonide (PULMICORT) nebulizer solution  0.25 mg Nebulization BID  . dextromethorphan-guaiFENesin  1 tablet Oral BID  . enoxaparin (LOVENOX) injection  30 mg Subcutaneous Q24H  . ipratropium-albuterol  3 mL Nebulization BID  . isosorbide mononitrate  30 mg Oral Daily  . levothyroxine  125 mcg Oral Q0600  . pantoprazole  40 mg Oral Daily  . QUEtiapine  100 mg Oral QHS  . QUEtiapine  50 mg Oral Daily  . simvastatin  20 mg Oral Daily  . sodium chloride flush  3 mL Intravenous Q12H   Continuous Infusions: . sodium chloride    . sodium chloride     PRN Meds:.sodium chloride,  acetaminophen, albuterol, haloperidol lactate, hydrALAZINE, ondansetron (ZOFRAN) IV, sodium chloride flush Medications Prior to Admission:  Prior to Admission medications   Medication Sig Start Date End Date Taking? Authorizing Provider  aspirin EC 81 MG tablet Take 81 mg by mouth daily. Swallow whole.   Yes [provider]  atenolol (TENORMIN) 25 MG tablet Take 25 mg by mouth daily. 08/19/19  Yes [provider]  isosorbide mononitrate (IMDUR) 30 MG 24 hr tablet Take 30 mg by mouth daily.   Yes [provider]  levothyroxine (SYNTHROID) 125 MCG tablet Take 125 mcg by mouth daily. 08/19/20  Yes [provider]  lisinopril (PRINIVIL,ZESTRIL) 5 MG tablet Take 5 mg by mouth daily.   Yes [provider]  omeprazole (PRILOSEC) 20 MG capsule Take 20 mg by mouth daily.   Yes [provider]  simvastatin (ZOCOR) 20 MG tablet Take 20 mg by mouth daily.    Yes [provider]  indomethacin (INDOCIN SR) 75 MG CR capsule Take 1 capsule (75 mg total) by mouth 2 (two) times daily with a meal. Patient not taking: No sig reported 01/06/16   Hyatt, Max T, DPM  tamsulosin (FLOMAX) 0.4 MG CAPS capsule Take 0.4 mg by mouth daily. Patient not taking: No sig reported    [provider]   No Known Allergies Review of Systems  Unable to perform ROS: Mental status change    Physical Exam Constitutional:      Appearance: He is normal weight. He is ill-appearing.  Cardiovascular:     Rate and Rhythm: Normal rate.  Pulmonary:     Breath sounds: Examination of the right-lower field reveals decreased breath sounds. Examination of the left-lower field reveals decreased breath sounds. Decreased breath sounds present.  Neurological:     Mental Status: He is lethargic.     Vital Signs: BP (!) 146/72 (BP Location: Left Leg)   Pulse (!) 50   Temp 97.7 F (36.5 C)   Resp 19   Ht 5\' 11"  (1.803 m)   Wt 78.3 kg   SpO2 92%   BMI 24.09 kg/m   Pain Scale: 0-10   Pain Score: 0-No pain   SpO2: SpO2: 92 % O2 Device:SpO2: 92 % O2 Flow Rate: .O2 Flow Rate (L/min): 2 L/min  IO: Intake/output summary:  Intake/Output Summary (Last 24 hours) at 10/19/2020 0920 Last data filed at 10/18/2020 1500 Gross per 24 hour  Intake 0 ml  Output 1250 ml  Net -1250 ml    LBM: Last BM Date: 10/14/20 Baseline Weight: Weight: 86.2 kg Most recent weight: Weight: 78.3 kg     Palliative Assessment/Data: 30 % at best   Discussed with Dr Arbutus Ped via secure chat  Time In: 0930 Time Out: 1045 Time Total: 75 minutes Greater than 50%  of this time was spent counseling and coordinating care related to the above assessment and plan.  Signed by: Wadie Lessen, NP   Please contact Palliative Medicine Team phone at [phone] for questions and concerns.  For individual provider: See Shea Evans

## 2020-10-19 NOTE — Care Management Important Message (Signed)
Important Message  Patient Details  Name: YANDIEL BERGUM MRN: [id number] Date of Birth: 1928-06-20   Medicare Important Message Given:  Yes     Dannette Barbara 10/19/2020, 12:17 PM

## 2020-10-20 ENCOUNTER — Inpatient Hospital Stay: Payer: Medicare Other

## 2020-10-20 DIAGNOSIS — R627 Adult failure to thrive: Secondary | ICD-10-CM | POA: Diagnosis not present

## 2020-10-20 DIAGNOSIS — F0281 Dementia in other diseases classified elsewhere with behavioral disturbance: Secondary | ICD-10-CM | POA: Diagnosis not present

## 2020-10-20 DIAGNOSIS — R4182 Altered mental status, unspecified: Secondary | ICD-10-CM

## 2020-10-20 DIAGNOSIS — J411 Mucopurulent chronic bronchitis: Secondary | ICD-10-CM | POA: Diagnosis not present

## 2020-10-20 DIAGNOSIS — G309 Alzheimer's disease, unspecified: Secondary | ICD-10-CM | POA: Diagnosis not present

## 2020-10-20 DIAGNOSIS — N184 Chronic kidney disease, stage 4 (severe): Secondary | ICD-10-CM | POA: Diagnosis not present

## 2020-10-20 DIAGNOSIS — J9610 Chronic respiratory failure, unspecified whether with hypoxia or hypercapnia: Secondary | ICD-10-CM | POA: Diagnosis not present

## 2020-10-20 LAB — BASIC METABOLIC PANEL
Anion gap: 12 (ref 5–15)
BUN: 64 mg/dL — ABNORMAL HIGH (ref 8–23)
CO2: 30 mmol/L (ref 22–32)
Calcium: 8.5 mg/dL — ABNORMAL LOW (ref 8.9–10.3)
Chloride: 98 mmol/L (ref 98–111)
Creatinine, Ser: 3.2 mg/dL — ABNORMAL HIGH (ref 0.61–1.24)
GFR, Estimated: 18 mL/min — ABNORMAL LOW (ref >60)
Glucose, Bld: 92 mg/dL (ref 70–99)
Potassium: 4.8 mmol/L (ref 3.5–5.1)
Sodium: 140 mmol/L (ref 135–145)

## 2020-10-20 LAB — CBC
HCT: 51.5 % (ref 39.0–52.0)
Hemoglobin: 17.1 g/dL — ABNORMAL HIGH (ref 13.0–17.0)
MCH: 34.1 pg — ABNORMAL HIGH (ref 26.0–34.0)
MCHC: 33.2 g/dL (ref 30.0–36.0)
MCV: 102.8 fL — ABNORMAL HIGH (ref 80.0–100.0)
Platelets: 125 10*3/uL — ABNORMAL LOW (ref 150–400)
RBC: 5.01 MIL/uL (ref 4.22–5.81)
RDW: 13.8 % (ref 11.5–15.5)
WBC: 11.7 10*3/uL — ABNORMAL HIGH (ref 4.0–10.5)
nRBC: 0 % (ref 0.0–0.2)

## 2020-10-20 LAB — BLOOD GAS, ARTERIAL
Acid-Base Excess: 4.1 mmol/L — ABNORMAL HIGH (ref 0.0–2.0)
Bicarbonate: 28.5 mmol/L — ABNORMAL HIGH (ref 20.0–28.0)
FIO2: 0.26
O2 Saturation: 94.8 %
Patient temperature: 37
pCO2 arterial: 41 mmHg (ref 32.0–48.0)
pH, Arterial: 7.45 (ref 7.350–7.450)
pO2, Arterial: 71 mmHg — ABNORMAL LOW (ref 83.0–108.0)

## 2020-10-20 LAB — HEPATIC FUNCTION PANEL
ALT: 18 U/L (ref 0–44)
AST: 28 U/L (ref 15–41)
Albumin: 3.3 g/dL — ABNORMAL LOW (ref 3.5–5.0)
Alkaline Phosphatase: 60 U/L (ref 38–126)
Bilirubin, Direct: 0.4 mg/dL — ABNORMAL HIGH (ref 0.0–0.2)
Indirect Bilirubin: 1.2 mg/dL — ABNORMAL HIGH (ref 0.3–0.9)
Total Bilirubin: 1.6 mg/dL — ABNORMAL HIGH (ref 0.3–1.2)
Total Protein: 6 g/dL — ABNORMAL LOW (ref 6.5–8.1)

## 2020-10-20 LAB — AMMONIA: Ammonia: 24 umol/L (ref 9–35)

## 2020-10-20 MED ORDER — SODIUM CHLORIDE 0.9 % IV SOLN: INTRAVENOUS | Status: DC

## 2020-10-20 MED ORDER — TAMSULOSIN HCL 0.4 MG PO CAPS
0.4000 mg | ORAL_CAPSULE | Freq: Every day | ORAL | Status: DC
  Administered 2020-10-21 – 2020-10-26 (×5): 0.4 mg via ORAL
  Filled 2020-10-20 (×6): qty 1

## 2020-10-20 MED ORDER — SIMVASTATIN 10 MG PO TABS
10.0000 mg | ORAL_TABLET | Freq: Every day | ORAL | Status: DC
  Administered 2020-10-20 – 2020-10-26 (×6): 10 mg via ORAL
  Filled 2020-10-20 (×7): qty 1

## 2020-10-20 MED ORDER — QUETIAPINE FUMARATE 25 MG PO TABS
25.0000 mg | ORAL_TABLET | Freq: Every day | ORAL | Status: DC
  Administered 2020-10-21 – 2020-10-26 (×5): 25 mg via ORAL
  Filled 2020-10-20 (×7): qty 1

## 2020-10-20 NOTE — Progress Notes (Addendum)
PROGRESS NOTE    Louis Harrison   JOA:[id number]  DOB: 09-15-1928  PCP: [facility]    DOA: 10/15/2020 LOS: 5   Brief Narrative   Louis Harrison is a 86 y.o. male with medical history significant for dementia who lives alone at home, COPD with chronic respiratory failure on 2 L/min oxygen, chronic diastolic dysfunction CHF and HTN presented by EMS for evaluation of mental status changes.  Son reported dementia seems to be recently worsening over the past few weeks, with worsening visual hallucinations and agitations, refusing care, more difficult to reorient.  Concerns for safety at home.  Patient also had progressive shortness of breath on exertion, productive cough.  No fevers or chills.  Evaluation in ED was consistent with decompensated heart failure with elevated BNP 1311, normal lactate, no leukocytosis, negative procalcitonin and CXR with bibasilar airspace opacities concerning for infection or edema.    Admitted to hospitalist service with cardiology consulted.  Initially diuresed with IV Lasix with caution given CKD.  Pt's respiratory status and LE edema improved.  He was also treated for mild COPD exacerbation on admission but systemic steroids were deferred due to severe agitation and combativeness, and only had minimal wheezing.  Patient has had significant behavioral disturbances since admission, very combative and difficult to manage.  Psychiatry was consulted while patient in the ED and recommended as needed IV Haldol.  Scheduled Seroquel BID has since been started and adjusting dose for control of behaviors while minimizing sedation.  Has not required PRN medication for agitation.  Have reduced AM dose of Haldol to 25 mg.    Palliative care is consulted and discussions are ongoing with patient's two sons regarding goals of care.       Assessment & Plan   Principal Problem:   Dementia ([place]) Active Problems:   Chronic respiratory failure (HCC)    COPD (chronic obstructive pulmonary disease) (HCC)   Hypertension   Coronary artery disease   Acute CHF (congestive heart failure) (HCC)   CKD (chronic kidney disease), stage IV ([place])   Hypothyroidism   Addendum ~7pm - notified by RN patient again somnolent and unresponsive to sternal rub.  When I saw pt with son at bedside late afternoon, pt was more drowsy appearing but awake and still up in chair.  --head CT pending --ABG without hypercarbia --Ammonia and hepatin function panel pending (LFT's were within normal on admission except Tbili 1. --hold bedtime Seroquel if still somnolent Suspect disrupted sleep cycle and medication related.   Acute on chronic diastolic CHF -present on admission with dyspnea on exertion, elevated BNP and x-ray findings with pulmonary edema.   Cardiology is consulted.   12/27 Diuretics held due to worsening renal function. Initially given IV Lasix, then oral torsemide 40 mg daily Off Diuretics for worsening AKI.  Continue atenolol.   Strict I/O's and daily weights.   Low-sodium diet with fluid restriction.   BP control as below Net IO Since Admission: -3,758.59 mL [10/20/20 1930] (I/O's inaccurate due to urinary incontinence)   Dementia with behavioral disturbances - Per son, patient lives alone and has had more difficulties with worsening dementia including hallucinations, combativeness, paranoia, refusal of care, taking off oxygen.  He has also had frequent falls and unsafe to remain home alone without 24/7 supervision. Will require placement. 12/27 - very somnolent, no PRN meds needed for agitation but schedule Seroquel doses yesterday were given close together 12/28 - awake, in chair, no excessive agitation, progressively drowsy over  course of the day however --Psychiatry consulted --reduce AM Seroquel to 25 mg --continue bedtime Seroquel at 50 mg  --IV Haldol as needed, avoid if possible (sitter preferred over meds if possible) --TOC consulted  for placement --Palliative care consulted for [place] discussions --Serial EKGs to monitor QTC    Acute kidney injury superimposed on CKD stage IV - AKI present on admission with creatinine 1.92, initially improved with diuresis, but since Cr rising.   AKI is worsening due to likely dehydration with little to no PO intake for several days due to above, somnolence.   Cr rising up to 3.20 this AM. --Gentle IV hydration given no PO intake.  --Nephrology consulted Monitor volume status closely on fluids Monitor BMP.   Avoid nephrotoxins and hypotension.  Renally dose meds.   COPD with acute exacerbation / Chronic respiratory failure  Exacerbation resolved, no wheezing, aeration improved. -at baseline patient uses 2 L/min nasal cannula oxygen.  Continue scheduled and as needed nebs per orders, inhaled corticosteroid.  Mucinex.  Zithromax completed.  Systemic steroids deferred due to severe agitation and combativeness.     Elevated troponin -mild and flat trend without any active chest pain.  This likely secondary to demand ischemia in the setting of decompensated CHF.   Hypertension -chronic, uncontrolled on admission.   Hold lisinopril.  As needed hydralazine if uncontrolled.   History of CAD -stable with no active chest pain.  Continue lisinopril, Imdur, aspirin and simvastatin   Hypothyroidism -continue Synthroid.  TSH elevated at 6.911, but free T4 normal.   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  ?if R knee pain related.  Hold home Indocin with renal failure.  Appears not on urate lowering medication.   BPH - continue Flomax   GERD - on PPI   DVT prophylaxis: enoxaparin (LOVENOX) injection 30 mg Start: 10/18/20 2000   Diet:  Diet Orders (From admission, onward)    Start     Ordered   10/15/20 1545  Diet 2 gram sodium Room service appropriate? Yes; Fluid consistency: Thin  Diet effective now       Question Answer Comment  Room service appropriate? Yes   Fluid  consistency: Thin      10/15/20 1545            Code Status: DNR    Subjective 10/20/20    Patient seen this AM, awake up in the chair.  Says he feels fair.  Reports some right knee pain.  Has not required PRN meds for agitation overnight or today, only scheduled Seroquel   Disposition Plan & Communication   Status is: Inpatient  Remains inpatient appropriate because:IV treatments appropriate due to intensity of illness or inability to take PO.  Worsening renal failure, no PO intake, behavioral disturbances in setting of dementia.   Dispo: The patient is from: Home              Anticipated d/c is to: SNF              Anticipated d/c date is: 3 days              Patient currently is not medically stable to d/c.   Family Communication: none at bedside on rounds. Returned to bedside late afternoon and spoke with son, Nada Boozer about patient status.   Consults, Procedures, Significant Events   Consultants:   Cardiology  Palliative care  Nephrology  Procedures:   Echo EF 45-50%  Antimicrobials:  Anti-infectives (From admission, onward)  Start     Dose/Rate Route Frequency Ordered Stop   10/15/20 1600  azithromycin (ZITHROMAX) tablet 500 mg        500 mg Oral Daily 10/15/20 1554 10/19/20 1056         Objective   Vitals:   10/20/20 0842 10/20/20 1209 10/20/20 1612 10/20/20 1738  BP: (!) 99/56 (!) 86/70 (!) 106/56 129/64  Pulse: (!) 59 (!) 57 (!) 56 (!) 42  Resp: 20 20 18    Temp: 97.9 F (36.6 C) 98.1 F (36.7 C) 98.1 F (36.7 C)   TempSrc:      SpO2: 91% 92% 91% 97%  Weight:      Height:        Intake/Output Summary (Last 24 hours) at 10/20/2020 1930 Last data filed at 10/20/2020 1906 Gross per 24 hour  Intake 240 ml  Output 500 ml  Net -260 ml   Filed Weights   10/17/20 0350 10/19/20 0442 10/20/20 0540  Weight: 83.2 kg 78.3 kg 78 kg    Physical Exam:  General exam: awake up in chair, calm, no acute distress Respiratory system: clear  bilaterally with diminished bases but no wheezes or rhonchi, normal respiratory effort, on nasal cannula oxygen Cardiovascular system: normal S1/S2, RRR Gastrointestinal system: soft, NT, ND, +bowel sounds Central nervous system: alert, oriented to self only, no gross focal motor deficits, CN's grossly intact, exam limited by pt inability to follow commands Extremities: no LE edema, moves all, right knee tenderness on palpation without warmth or erythema   Labs   Data Reviewed: I have personally reviewed following labs and imaging studies  CBC: Recent Labs  Lab 10/15/20 1343 10/17/20 0713 10/18/20 0854 10/19/20 0415 10/20/20 0604  WBC 7.8 9.5 10.9* 10.0 11.7*  NEUTROABS 6.2  --   --   --   --   HGB 15.6 17.7* 17.4* 18.1* 17.1*  HCT 47.8 53.7* 53.5* 54.4* 51.5  MCV 104.8* 102.5* 103.1* 102.6* 102.8*  PLT 110* 125* 130* 132* 229*   Basic Metabolic Panel: Recent Labs  Lab 10/15/20 1115 10/17/20 0528 10/18/20 0854 10/19/20 0415 10/20/20 0604  NA 143 140 140 141 140  K 4.4 4.2 4.3 4.4 4.8  CL 108 102 97* 98 98  CO2 26 27 32 28 30  GLUCOSE 101* 98 93 100* 92  BUN 27* 31* 32* 46* 64*  CREATININE 1.92* 1.69* 1.89* 2.70* 3.20*  CALCIUM 8.4* 8.7* 8.7* 8.7* 8.5*  MG  --   --  2.0  --   --    GFR: Estimated Creatinine Clearance: 16.3 mL/min (A) (by C-G formula based on SCr of 3.2 mg/dL (H)). Liver Function Tests: Recent Labs  Lab 10/15/20 1115  AST 19  ALT 17  ALKPHOS 58  BILITOT 1.5*  PROT 5.8*  ALBUMIN 3.5   Recent Labs  Lab 10/15/20 1115  LIPASE 19   No results for input(s): AMMONIA in the last 168 hours. Coagulation Profile: Recent Labs  Lab 10/15/20 1115  INR 1.1   Cardiac Enzymes: No results for input(s): CKTOTAL, CKMB, CKMBINDEX, TROPONINI in the last 168 hours. BNP (last 3 results) No results for input(s): PROBNP in the last 8760 hours. HbA1C: No results for input(s): HGBA1C in the last 72 hours. CBG: No results for input(s): GLUCAP in the last  168 hours. Lipid Profile: No results for input(s): CHOL, HDL, LDLCALC, TRIG, CHOLHDL, LDLDIRECT in the last 72 hours. Thyroid Function Tests: No results for input(s): TSH, T4TOTAL, FREET4, T3FREE, THYROIDAB in the last 72  hours. Anemia Panel: No results for input(s): VITAMINB12, FOLATE, FERRITIN, TIBC, IRON, RETICCTPCT in the last 72 hours. Sepsis Labs: Recent Labs  Lab 10/15/20 1115 10/15/20 1343 10/17/20 0528  PROCALCITON  --   --  <0.10  LATICACIDVEN 1.6 1.4  --     Recent Results (from the past 240 hour(s))  Resp Panel by RT-PCR (Flu A&B, Covid) Nasopharyngeal Swab     Status: None   Collection Time: 10/15/20 11:38 AM   Specimen: Nasopharyngeal Swab; Nasopharyngeal(NP) swabs in vial transport medium  Result Value Ref Range Status   SARS Coronavirus 2 by RT PCR NEGATIVE NEGATIVE Final    Comment: (NOTE) SARS-CoV-2 target nucleic acids are NOT DETECTED.  The SARS-CoV-2 RNA is generally detectable in upper respiratory specimens during the acute phase of infection. The lowest concentration of SARS-CoV-2 viral copies this assay can detect is 138 copies/mL. A negative result does not preclude SARS-Cov-2 infection and should not be used as the sole basis for treatment or other patient management decisions. A negative result may occur with  improper specimen collection/handling, submission of specimen other than nasopharyngeal swab, presence of viral mutation(s) within the areas targeted by this assay, and inadequate number of viral copies(<138 copies/mL). A negative result must be combined with clinical observations, patient history, and epidemiological information. The expected result is Negative.  Fact Sheet for Patients:  EntrepreneurPulse.com.au  Fact Sheet for Healthcare Providers:  IncredibleEmployment.be  This test is no t yet approved or cleared by the [place] FDA and  has been authorized for detection and/or diagnosis of  SARS-CoV-2 by FDA under an Emergency Use Authorization (EUA). This EUA will remain  in effect (meaning this test can be used) for the duration of the COVID-19 declaration under Section 564(b)(1) of the Act, 21 U.S.C.section 360bbb-3(b)(1), unless the authorization is terminated  or revoked sooner.       Influenza A by PCR NEGATIVE NEGATIVE Final   Influenza B by PCR NEGATIVE NEGATIVE Final    Comment: (NOTE) The Xpert Xpress SARS-CoV-2/FLU/RSV plus assay is intended as an aid in the diagnosis of influenza from Nasopharyngeal swab specimens and should not be used as a sole basis for treatment. Nasal washings and aspirates are unacceptable for Xpert Xpress SARS-CoV-2/FLU/RSV testing.  Fact Sheet for Patients: EntrepreneurPulse.com.au  Fact Sheet for Healthcare Providers: IncredibleEmployment.be  This test is not yet approved or cleared by the [place] FDA and has been authorized for detection and/or diagnosis of SARS-CoV-2 by FDA under an Emergency Use Authorization (EUA). This EUA will remain in effect (meaning this test can be used) for the duration of the COVID-19 declaration under Section 564(b)(1) of the Act, 21 U.S.C. section 360bbb-3(b)(1), unless the authorization is terminated or revoked.  Performed at [facility], [address] [place], [place] [address]       Imaging Studies   CT HEAD WO CONTRAST  Result Date: 10/20/2020 CLINICAL DATA:  Mental status change. EXAM: CT HEAD WITHOUT CONTRAST TECHNIQUE: Contiguous axial images were obtained from the base of the skull through the vertex without intravenous contrast. COMPARISON:  CT head 10/15/2020 FINDINGS: Brain: Cerebral ventricle sizes are concordant with the degree of cerebral volume loss. Patchy and confluent areas of decreased attenuation are noted throughout the deep and periventricular white matter of the cerebral hemispheres bilaterally, compatible with  chronic microvascular ischemic disease. No evidence of large-territorial acute infarction. No parenchymal hemorrhage. No mass lesion. No extra-axial collection. No mass effect or midline shift. No hydrocephalus. Basilar cisterns are patent.  Vascular: No hyperdense vessel. Skull: No acute fracture or focal lesion. Sinuses/Orbits: Paranasal sinuses and mastoid air cells are clear. The orbits are unremarkable. Other: None. IMPRESSION: No acute intracranial abnormality. Electronically Signed   By: Iven Finn M.D.   On: 10/20/2020 19:13   US RENAL  Result Date: 10/19/2020 CLINICAL DATA:  Acute kidney injury EXAM: RENAL / URINARY TRACT ULTRASOUND COMPLETE COMPARISON:  None. FINDINGS: Right Kidney: Renal measurements: 10.3 x 4.8 x 4.7 cm = volume: 121 mL. Echogenic renal parenchyma. 3.7 x 4.0 x 3.3 cm interpolar right renal cyst with a single incomplete septation, benign. No hydronephrosis. Left Kidney: Was not imaged.  Patient aborted the study. Bladder: Appears normal for degree of bladder distention. Other: None. IMPRESSION: Left kidney not imaged due to patient request. 4.0 cm minimally complex right renal cyst, benign. No hydronephrosis. Electronically Signed   By: Julian Hy M.D.   On: 10/19/2020 11:32   DG Chest Port 1 View  Result Date: 10/19/2020 CLINICAL DATA:  Altered mental status EXAM: PORTABLE CHEST 1 VIEW COMPARISON:  10/15/2020 FINDINGS: Mild bilateral lower lobe opacities, atelectasis versus pneumonia. No pleural effusion or pneumothorax. The heart is top-normal in size.  Thoracic aortic atherosclerosis. IMPRESSION: Mild bilateral lower lobe opacities, atelectasis versus pneumonia. Electronically Signed   By: Julian Hy M.D.   On: 10/19/2020 08:26     Medications   Scheduled Meds: . aspirin EC  81 mg Oral Daily  . atenolol  25 mg Oral Daily  . budesonide (PULMICORT) nebulizer solution  0.25 mg Nebulization BID  . dextromethorphan-guaiFENesin  1 tablet Oral BID  .  enoxaparin (LOVENOX) injection  30 mg Subcutaneous Q24H  . ipratropium-albuterol  3 mL Nebulization BID  . isosorbide mononitrate  30 mg Oral Daily  . levothyroxine  125 mcg Oral Q0600  . pantoprazole  40 mg Oral Daily  . [START ON 10/21/2020] QUEtiapine  25 mg Oral Daily  . QUEtiapine  50 mg Oral QHS  . simvastatin  10 mg Oral Daily  . sodium chloride flush  3 mL Intravenous Q12H   Continuous Infusions: . sodium chloride    . sodium chloride 75 mL/hr at 10/20/20 1807       LOS: 5 days    Time spent: 35 minutes with greater than 50% spent in coordination of care and direct patient contact.    Ezekiel Slocumb, DO [facility]  10/20/2020, 7:30 PM    If 7PM-7AM, please contact night-coverage. How to contact the [facility] Attending or Consulting provider [place] or covering provider during after hours [place], for this patient?    1. Check the care team in [facility] and look for a) attending/consulting [facility] provider listed and b) the [facility] team listed 2. Log into [email or web address] and use [place]'s universal password to access. If you do not have the password, please contact the hospital operator. 3. Locate the [facility] provider you are looking for under [facility] and page to a number that you can be directly reached. 4. If you still have difficulty reaching the provider, please page the [facility] (Director on Call) for the Hospitalists listed on amion for assistance.

## 2020-10-20 NOTE — Progress Notes (Signed)
Occupational Therapy Treatment Patient Details Name: Louis Harrison MRN: [id number] DOB: [birth year] Today's Date: 10/20/2020    History of present illness Pt is a 86 y/o M who was admitted on 10/15/20 after presenting to ED for evaluation of AMS which son describes as visual hallucinations & agitation. PMH: dementia, COPD with chronic respiratory failure, chronic diastolic dysfunction CHF, HTN, larynx CA, heart attack (1995), hyperlipidemia, renal artery stenosis, CKD stage 3   OT comments  Mr Moffatt was seen for OT treatment on this date. Upon arrival to room pt reclined in chair asleep c meal tray untouched and feet off of recliner. Pt wakes easily and is A&O self only - noted difficulty sequencing and poor safety awareness t/o. MIN A don/doff gown seated EOB. MOD A + HHA + R rail for ADL t/f. SETUP + VCs self-feeding seated in chair. Pt making progress toward goals. Pt continues to benefit from skilled OT services to maximize return to PLOF and minimize risk of future falls, injury, caregiver burden, and readmission. Will continue to follow POC. Discharge recommendation remains appropriate.    Follow Up Recommendations  SNF    Equipment Recommendations  Tub/shower seat    Recommendations for Other Services      Precautions / Restrictions Precautions Precautions: Fall Restrictions Weight Bearing Restrictions: No       Mobility Bed Mobility               General bed mobility comments: Pt received and left up in chair  Transfers Overall transfer level: Needs assistance Equipment used: 1 person hand held assist Transfers: Sit to/from Stand Sit to Stand: Mod assist         General transfer comment: Difficulty sequencing and safety deficits. HHA + R rail    Balance Overall balance assessment: Needs assistance Sitting-balance support: No upper extremity supported;Feet supported Sitting balance-Leahy Scale: Fair     Standing balance support: Bilateral upper  extremity supported Standing balance-Leahy Scale: Poor                             ADL either performed or assessed with clinical judgement   ADL Overall ADL's : Needs assistance/impaired                                       General ADL Comments: MIN A don/doff gown seated EOB. MOD A + HHA for ADL t/f. SETUP + VCs self-feeding seated in chair               Cognition Arousal/Alertness: Awake/alert Behavior During Therapy: Flat affect Overall Cognitive Status: Impaired/Different from baseline Area of Impairment: Orientation;Following commands;Safety/judgement;Problem solving                 Orientation Level: Disoriented to;Place;Time;Situation     Following Commands: Follows one step commands inconsistently Safety/Judgement: Decreased awareness of safety;Decreased awareness of deficits   Problem Solving: Slow processing;Decreased initiation;Difficulty sequencing;Requires verbal cues;Requires tactile cues General Comments: Requires significantly increased cues from last OT session. Cognition limiting ability to mobilize safely this date. Multimodal cues t/o to sequence        Exercises Exercises: Other exercises Other Exercises Other Exercises: Pt educated re: OT role, DME recs, falls prevention, safe t/f techniques Other Exercises: UBD, sit<>stand, sitting/standing balance/tolerance, self-feeding   Shoulder Instructions       General Comments SITTING: BP 79/52, MAP  63    Pertinent Vitals/ Pain       Pain Assessment: Faces Faces Pain Scale: No hurt         Frequency  Min 1X/week        Progress Toward Goals  OT Goals(current goals can now be found in the care plan section)  Progress towards OT goals: Progressing toward goals  Acute Rehab OT Goals Patient Stated Goal: go home OT Goal Formulation: With patient Time For Goal Achievement: 11/07/2020 Potential to Achieve Goals: Good ADL Goals Pt Will Perform Grooming:  with modified independence;standing (c LRAD PRN) Pt Will Perform Lower Body Dressing: with modified independence;sit to/from stand (c LRAD PRN) Pt Will Transfer to Toilet: with modified independence;ambulating;regular height toilet (c LRAD PRN)  Plan Discharge plan remains appropriate;Frequency remains appropriate       AM-PAC OT "6 Clicks" Daily Activity     Outcome Measure   Help from another person eating meals?: A Little Help from another person taking care of personal grooming?: A Lot Help from another person toileting, which includes using toliet, bedpan, or urinal?: A Lot Help from another person bathing (including washing, rinsing, drying)?: A Little Help from another person to put on and taking off regular upper body clothing?: A Little Help from another person to put on and taking off regular lower body clothing?: A Lot 6 Click Score: 15    End of Session    OT Visit Diagnosis: History of falling (Z91.81);Other abnormalities of gait and mobility (R26.89)   Activity Tolerance Patient tolerated treatment well   Patient Left in chair;with call bell/phone within reach;with chair alarm set   Nurse Communication          Time: 9758-8325 OT Time Calculation (min): 14 min  Charges: OT General Charges $OT Visit: 1 Visit OT Treatments $Self Care/Home Management : 8-22 mins  Dessie Coma, M.S. OTR/L  10/20/20, 2:21 PM  ascom [phone]

## 2020-10-20 NOTE — Progress Notes (Signed)
Report called to Audrea Muscat on 2C. Patient will be transferred to 203. Patient's son Nada Boozer was notified at the bedside.

## 2020-10-20 NOTE — Progress Notes (Signed)
[facility] Encounter Note  Patient: Louis Harrison / Admit Date: 10/15/2020 / Date of Encounter: 10/20/2020, 8:52 AM   Subjective: 12/25.  Overall patient has been active throughout the evening with no specific complaints.  Patient cannot converse well about his symptoms and/or health at this time.  Telemetry has continued to show sinus bradycardia with no other significant rhythm disturbances.  Troponin is 70/82/93 consistent with either demand ischemia or kidney dysfunction without evidence of acute coronary syndrome.  Patient has had acute on chronic systolic dysfunction heart failure with some bibasilar Rales unchanged from before but concerns for chronic kidney disease now with only 500 cc net output and continued trace lower extremity edema.  12/26.  Patient still not conversing well and unable to get any history from overnight.  Telemetry still showing sinus bradycardia.  Glomerular filtration rate stable at 38 with some bibasilar crackles by exam.  Troponin continued to be flat with the latest being 79 and no current evidence of apparent anginal symptoms or acute coronary syndrome.  Hypertension control appears reasonable compared to yesterday and possibly improvement.  There is concerns of possible worsening chronic kidney disease and overdiuresis and therefore may consider the possibility of changing intravenous furosemide to torsemide orally.  Unfortunately having the patient take his medications in the future may be an issue   12/27.  Patient still not conversing at all and unable to discuss any symptoms or issues.  Patient did have a discontinuation of IV Lasix but kidney injury slightly worse with a glomerular filtration rate of 22 and therefore discontinuation of all diuretics would be appropriate.  In addition to that further evaluation of the possibility pulmonary edema resolution due to difficulty in having patient take deep breaths and truly assess.  The  patient continues to have sinus bradycardia by telemetry.  12/28.  Patient is still having evenings of combativeness and unable to converse with him today.  Have had a long discussion with the son about his current condition.  Previously the patient was frequently functional.  With his congestive heart failure the patient has now had some diuresis for which has now potentially cause some mild amount of dehydration and acute kidney injury.  We have abstained from diuretics since 1226 to potentially improve this issue.  Chest x-ray has shown no evidence of pulmonary edema although there is bilateral basilar atelectasis.  Currently with discussion with family the patient has sedation to a significant degree and may have some difficulties awakening in the coming days.  Otherwise there are no apparent cardiovascular concerns  Echocardiogram showing mild apical hypokinesis with ejection fraction of 45% and some mitral and tricuspid regurgitation  Review of Systems: Cannot assess   objective: Telemetry: Sinus bradycardia Physical Exam: Blood pressure (!) 99/56, pulse (!) 59, temperature 97.9 F (36.6 C), resp. rate 20, height 5\' 11"  (1.803 m), weight 78 kg, SpO2 91 %. Body mass index is 23.98 kg/m. General: Well developed, well nourished, in no acute distress. Head: Normocephalic, atraumatic, sclera non-icteric, no xanthomas, nares are without discharge. Neck: No apparent masses Lungs: Normal respirations with few wheezes, no rhonchi, no rales , bibasilar crackles   Heart: Regular rate and rhythm, normal S1 S2, no murmur, no rub, no gallop, PMI is normal size and placement, carotid upstroke normal without bruit, jugular venous pressure normal Abdomen: Soft, non-tender, non-distended with normoactive bowel sounds. No hepatosplenomegaly. Abdominal aorta is normal size without bruit Extremities: N trace edema, no clubbing, no cyanosis, no ulcers,  Peripheral:  2+ radial, 2+ femoral, 2+ dorsal pedal  pulses Neuro: Not alert and oriented. Moves all extremities spontaneously. Psych: Does not responds to questions appropriately with a normal affect.   Intake/Output Summary (Last 24 hours) at 10/20/2020 0852 Last data filed at 10/19/2020 2031 Gross per 24 hour  Intake 452.41 ml  Output 200 ml  Net 252.41 ml    Inpatient Medications:  . aspirin EC  81 mg Oral Daily  . atenolol  25 mg Oral Daily  . budesonide (PULMICORT) nebulizer solution  0.25 mg Nebulization BID  . dextromethorphan-guaiFENesin  1 tablet Oral BID  . enoxaparin (LOVENOX) injection  30 mg Subcutaneous Q24H  . ipratropium-albuterol  3 mL Nebulization BID  . isosorbide mononitrate  30 mg Oral Daily  . levothyroxine  125 mcg Oral Q0600  . pantoprazole  40 mg Oral Daily  . QUEtiapine  50 mg Oral Daily  . QUEtiapine  50 mg Oral QHS  . simvastatin  10 mg Oral Daily  . sodium chloride flush  3 mL Intravenous Q12H   Infusions:  . sodium chloride    . sodium chloride Stopped (10/19/20 2033)    Labs: Recent Labs    10/18/20 0854 10/19/20 0415 10/20/20 0604  NA 140 141 140  K 4.3 4.4 4.8  CL 97* 98 98  CO2 32 28 30  GLUCOSE 93 100* 92  BUN 32* 46* 64*  CREATININE 1.89* 2.70* 3.20*  CALCIUM 8.7* 8.7* 8.5*  MG 2.0  --   --    No results for input(s): AST, ALT, ALKPHOS, BILITOT, PROT, ALBUMIN in the last 72 hours. Recent Labs    10/19/20 0415 10/20/20 0604  WBC 10.0 11.7*  HGB 18.1* 17.1*  HCT 54.4* 51.5  MCV 102.6* 102.8*  PLT 132* 125*   No results for input(s): CKTOTAL, CKMB, TROPONINI in the last 72 hours. Invalid input(s): POCBNP No results for input(s): HGBA1C in the last 72 hours.   Weights: Filed Weights   10/17/20 0350 10/19/20 0442 10/20/20 0540  Weight: 83.2 kg 78.3 kg 78 kg     Radiology/Studies:  CT Head Wo Contrast  Result Date: 10/15/2020 CLINICAL DATA:  Mental status change.  History of dementia. EXAM: CT HEAD WITHOUT CONTRAST TECHNIQUE: Contiguous axial images were obtained  from the base of the skull through the vertex without intravenous contrast. COMPARISON:  CT head 07/31/2014. FINDINGS: Brain: There is no evidence of acute intracranial hemorrhage, mass lesion, brain edema or extra-axial fluid collection. Mildly progressive atrophy with prominence of the ventricles and subarachnoid spaces. There is patchy low-density in the periventricular white matter bilaterally, likely due to chronic small vessel ischemic changes. There is no CT evidence of acute cortical infarction. Vascular:  No hyperdense vessel identified. Skull: Negative for fracture or focal lesion. Sinuses/Orbits: The visualized paranasal sinuses and mastoid air cells are clear. No orbital abnormalities are seen. Other: None. IMPRESSION: 1. No acute intracranial findings. 2. Mildly progressive atrophy and chronic small vessel ischemic changes. Electronically Signed   By: Richardean Sale M.D.   On: 10/15/2020 11:46   US RENAL  Result Date: 10/19/2020 CLINICAL DATA:  Acute kidney injury EXAM: RENAL / URINARY TRACT ULTRASOUND COMPLETE COMPARISON:  None. FINDINGS: Right Kidney: Renal measurements: 10.3 x 4.8 x 4.7 cm = volume: 121 mL. Echogenic renal parenchyma. 3.7 x 4.0 x 3.3 cm interpolar right renal cyst with a single incomplete septation, benign. No hydronephrosis. Left Kidney: Was not imaged.  Patient aborted the study. Bladder: Appears normal for degree of bladder  distention. Other: None. IMPRESSION: Left kidney not imaged due to patient request. 4.0 cm minimally complex right renal cyst, benign. No hydronephrosis. Electronically Signed   By: Julian Hy M.D.   On: 10/19/2020 11:32   DG Chest Port 1 View  Result Date: 10/19/2020 CLINICAL DATA:  Altered mental status EXAM: PORTABLE CHEST 1 VIEW COMPARISON:  10/15/2020 FINDINGS: Mild bilateral lower lobe opacities, atelectasis versus pneumonia. No pleural effusion or pneumothorax. The heart is top-normal in size.  Thoracic aortic atherosclerosis.  IMPRESSION: Mild bilateral lower lobe opacities, atelectasis versus pneumonia. Electronically Signed   By: Julian Hy M.D.   On: 10/19/2020 08:26   DG Chest Port 1 View  Result Date: 10/15/2020 CLINICAL DATA:  Syncopal episodes.  Multiple falls.  Dementia. EXAM: PORTABLE CHEST 1 VIEW COMPARISON:  Radiographs 03/12/2019 and 11/14/2017.  CT 12/20/2017. FINDINGS: 1120 hours. There are significantly lower lung volumes. The heart size and mediastinal contours are stable with aortic atherosclerosis. There are new patchy airspace opacities at both lung bases associated with poor definition of the pulmonary vasculature. No large pleural effusion, confluent airspace opacity or pneumothorax identified. Telemetry leads overlie the chest. No acute osseous findings. IMPRESSION: Lower lung volumes with new patchy bibasilar airspace opacities which could reflect viral infection or edema. Followup PA and lateral views recommended when the patient is able. Electronically Signed   By: Richardean Sale M.D.   On: 10/15/2020 11:44   DG Humerus Left  Result Date: 10/15/2020 CLINICAL DATA:  Frequent falls.  Left arm bruising.  Dementia. EXAM: LEFT HUMERUS - 2+ VIEW COMPARISON:  None. FINDINGS: The mineralization and alignment are normal. There is no evidence of acute fracture or dislocation. Minor degenerative changes at the shoulder and elbow for age. No foreign body or focal soft tissue swelling identified. IMPRESSION: No acute findings. Minor degenerative changes. Electronically Signed   By: Richardean Sale M.D.   On: 10/15/2020 11:41   ECHOCARDIOGRAM COMPLETE  Result Date: 10/16/2020    ECHOCARDIOGRAM REPORT   Patient Name:   Louis Harrison Date of Exam: 10/16/2020 Medical Rec #:  [id number]          Height:       71.0 in Accession #:    [id number]         Weight:       190.0 lb Date of Birth:  1928-08-24           BSA:          2.063 m Patient Age:    86 years           BP:           158/86 mmHg Patient  Gender: M                  HR:           57 bpm. Exam Location:  [facility] Procedure: 2D Echo, Color Doppler and Cardiac Doppler Indications:     I50.31 CHF-Acute Diastolic  History:         Patient has no prior history of Echocardiogram examinations.                  Previous Myocardial Infarction, COPD; Risk Factors:Hypertension                  and Dyslipidemia.  Sonographer:     Charmayne Sheer RDCS (AE) Referring Phys:  [id number] Collier Bullock Diagnosing Phys: Serafina Royals MD  Sonographer Comments: No subcostal window. Image acquisition  challenging due to COPD and Image acquisition challenging due to respiratory motion. IMPRESSIONS  1. Left ventricular ejection fraction, by estimation, is 45 to 50%. The left ventricle has mildly decreased function. The left ventricle demonstrates regional wall motion abnormalities (see scoring diagram/findings for description). Left ventricular diastolic parameters were normal.  2. Right ventricular systolic function is normal. The right ventricular size is normal.  3. Left atrial size was mildly dilated.  4. The mitral valve is normal in structure. Mild mitral valve regurgitation.  5. The aortic valve is normal in structure. Aortic valve regurgitation is not visualized. FINDINGS  Left Ventricle: Left ventricular ejection fraction, by estimation, is 45 to 50%. The left ventricle has mildly decreased function. The left ventricle demonstrates regional wall motion abnormalities. Mild hypokinesis of the left ventricular, apical apical segment. The left ventricular internal cavity size was normal in size. There is no left ventricular hypertrophy. Left ventricular diastolic parameters were normal. Right Ventricle: The right ventricular size is normal. No increase in right ventricular wall thickness. Right ventricular systolic function is normal. Left Atrium: Left atrial size was mildly dilated. Right Atrium: Right atrial size was normal in size. Pericardium: There is no evidence of  pericardial effusion. Mitral Valve: The mitral valve is normal in structure. Mild mitral valve regurgitation. MV peak gradient, 1.2 mmHg. The mean mitral valve gradient is 0.0 mmHg. Tricuspid Valve: The tricuspid valve is normal in structure. Tricuspid valve regurgitation is mild. Aortic Valve: The aortic valve is normal in structure. Aortic valve regurgitation is not visualized. Aortic valve mean gradient measures 3.0 mmHg. Aortic valve peak gradient measures 4.2 mmHg. Aortic valve area, by VTI measures 2.45 cm. Pulmonic Valve: The pulmonic valve was normal in structure. Pulmonic valve regurgitation is trivial. Aorta: The aortic root and ascending aorta are structurally normal, with no evidence of dilitation. IAS/Shunts: No atrial level shunt detected by color flow Doppler.  LEFT VENTRICLE PLAX 2D LVIDd:         4.96 cm  Diastology LVIDs:         3.26 cm  LV e' lateral:   6.09 cm/s LV PW:         0.97 cm  LV E/e' lateral: 7.8 LV IVS:        0.89 cm LVOT diam:     2.10 cm LV SV:         49 LV SV Index:   24 LVOT Area:     3.46 cm  RIGHT VENTRICLE RV Basal diam:  3.48 cm LEFT ATRIUM             Index       RIGHT ATRIUM           Index LA diam:        4.20 cm 2.04 cm/m  RA Area:     14.70 cm LA Vol (A2C):   66.4 ml 32.18 ml/m RA Volume:   32.80 ml  15.90 ml/m LA Vol (A4C):   43.8 ml 21.23 ml/m LA Biplane Vol: 54.7 ml 26.51 ml/m  AORTIC VALVE                   PULMONIC VALVE AV Area (Vmax):    2.41 cm    PV Vmax:       0.89 m/s AV Area (Vmean):   2.04 cm    PV Vmean:      59.200 cm/s AV Area (VTI):     2.45 cm    PV VTI:  0.163 m AV Vmax:           103.00 cm/s PV Peak grad:  3.2 mmHg AV Vmean:          76.900 cm/s PV Mean grad:  2.0 mmHg AV VTI:            0.201 m AV Peak Grad:      4.2 mmHg AV Mean Grad:      3.0 mmHg LVOT Vmax:         71.80 cm/s LVOT Vmean:        45.200 cm/s LVOT VTI:          0.142 m LVOT/AV VTI ratio: 0.71  AORTA Ao Root diam: 3.50 cm MITRAL VALVE MV Area (PHT): 2.59 cm     SHUNTS MV Peak grad:  1.2 mmHg    Systemic VTI:  0.14 m MV Mean grad:  0.0 mmHg    Systemic Diam: 2.10 cm MV Vmax:       0.55 m/s MV Vmean:      33.6 cm/s MV Decel Time: 293 msec MV E velocity: 47.60 cm/s MV A velocity: 39.80 cm/s MV E/A ratio:  1.20 Serafina Royals MD Electronically signed by Serafina Royals MD Signature Date/Time: 10/16/2020/2:47:53 PM    Final      Assessment and Recommendation  86 y.o. male with no significant amount dementia with that is worsening recently having weakness fatigue and shortness of breath with bibasilar changes on chest x-ray and what appears to be acute on chronic combined systolic diastolic dysfunction congestive heart failure multifactorial in nature including chronic kidney disease stage III worsening at this time with possible significant overdiuresis without evidence of myocardial infarction or acute coronary syndrome without ability to truly assess significant improvements due to patient's inability to converse well. 1.  Abstain from diuretics at this time due to worsening kidney dysfunction and possible kidney injury with chest x-ray showing no evidence of pulmonary edema.  Would consider the possibility of reinstatement as an outpatient as necessary 2.  Continue atenolol for heart rate control and blood pressure control despite some bradycardia which appears to be asymptomatic.  Would continue to follow very closely for any other significant bradycardia since atenolol is metabolized by the kidneys 3.   Okay for continuation hydralazine isosorbide for LV systolic dysfunction and hypertension control but will abstain from lisinopril temporarily due to concerns of acute kidney injury 4.  No further intervention of minimal elevation of troponin more consistent with demand ischemia 5.  Continuation of supportive care dementia and other significant concerns 6.  With discussion of family it appears that the next potential step is placement for the next few weeks for  recovery from above  Signed, Serafina Royals M.D. FACC

## 2020-10-20 NOTE — Progress Notes (Signed)
Patient ID: Louis Harrison, male   DOB: 25-Dec-1927, 86 y.o.   MRN: [id number]  This NP visited patient at the bedside as a follow up to  yesterday's Louis Harrison.  Spoke with son/HPOA for ongoing support and assessment for palliative medicine needs and emotional support.  Family verbalized an understanding of the seriousness of Louis Harrison's current medical situation,  however at this time they remain hopeful for improvement and open to continued medical interventions to treat the treatable and transition to skilled nursing facility for rehab in hopes of return to baseline  Discussed with son the importance of continued conversation with family and their  medical providers regarding overall plan of care and treatment options,  ensuring decisions are within the context of the patients values and GOCs.  Questions and concerns addressed     Recommend outpatient community-based palliative services at skilled nursing facility.  Family encouraged to call palliative medicine with questions or concerns  Total time spent on the unit was 15 minutes  Greater than 50% of the time was spent in counseling and coordination of care  Louis Lessen NP  Palliative Medicine Team Team Phone # [phone] Pager [phone]

## 2020-10-20 NOTE — Progress Notes (Signed)
Transfer cancelled. Patient is barely responsive even to sternal rub, MD was notified. Head CT ordered and labs ordered

## 2020-10-20 NOTE — Progress Notes (Signed)
Physical Therapy Treatment Patient Details Name: Louis Harrison MRN: [id number] DOB: [birth year] Today's Date: 10/20/2020    History of Present Illness Pt is a 86 y/o M who was admitted on 10/15/20 after presenting to ED for evaluation of AMS which son describes as visual hallucinations & agitation. PMH: dementia, COPD with chronic respiratory failure, chronic diastolic dysfunction CHF, HTN, larynx CA, heart attack (1995), hyperlipidemia, renal artery stenosis, CKD stage 3    PT Comments    Pt pleasantly confused sitting up in chair unsafely. Pt assisted to standing with ModA after several attempts due to impaired cognitive status and difficulty following desired commands. Pt tolerated ~30 seconds in flexed forward standing, unable to progress with ambulation due to high fall risk.  Pt assisted back to sitting to complete B LE AROM in sitting with max verbal and visual cues to complete 50% correctly. Pt left up in chair with call bell in reach and chair alarm on.     Follow Up Recommendations  Home health PT;Supervision/Assistance - 24 hour;Supervision for mobility/OOB     Equipment Recommendations  Rolling walker with 5" wheels    Recommendations for Other Services       Precautions / Restrictions Precautions Precautions: Fall Restrictions Weight Bearing Restrictions: No    Mobility  Bed Mobility               General bed mobility comments: Pt received and left up in chair  Transfers Overall transfer level: Needs assistance Equipment used: 1 person hand held assist Transfers: Sit to/from Stand Sit to Stand: Mod assist         General transfer comment: Difficulty sequencing and safety deficits. HHA + R rail  Ambulation/Gait                 Stairs             Wheelchair Mobility    Modified Rankin (Stroke Patients Only)       Balance Overall balance assessment: Needs assistance Sitting-balance support: No upper extremity supported;Feet  supported Sitting balance-Leahy Scale: Fair     Standing balance support: Bilateral upper extremity supported Standing balance-Leahy Scale: Poor                              Cognition Arousal/Alertness: Awake/alert Behavior During Therapy: Anxious Overall Cognitive Status: History of cognitive impairments - at baseline Area of Impairment: Orientation;Following commands;Safety/judgement;Problem solving                 Orientation Level: Disoriented to;Place;Time;Situation     Following Commands: Follows one step commands inconsistently Safety/Judgement: Decreased awareness of safety;Decreased awareness of deficits   Problem Solving: Slow processing;Decreased initiation;Difficulty sequencing;Requires verbal cues;Requires tactile cues General Comments:  (Pt pleasantly confused requiring significant repeated commands to complete simple tasks)      Exercises General Exercises - Lower Extremity Ankle Circles/Pumps: AROM;Both;10 reps;Seated (consisting of PF/DF, LAQ, Seated marching as able) Other Exercises Other Exercises: Pt educated re: OT role, DME recs, falls prevention, safe t/f techniques Other Exercises: UBD, sit<>stand, sitting/standing balance/tolerance, self-feeding    General Comments General comments (skin integrity, edema, etc.): SITTING: BP 79/52, MAP 63      Pertinent Vitals/Pain Pain Assessment: Faces Faces Pain Scale: No hurt    Home Living                      Prior Function  PT Goals (current goals can now be found in the care plan section) Acute Rehab PT Goals Patient Stated Goal: go home    Frequency    Min 2X/week      PT Plan Current plan remains appropriate    Co-evaluation              AM-PAC PT "6 Clicks" Mobility   Outcome Measure  Help needed turning from your back to your side while in a flat bed without using bedrails?: None Help needed moving from lying on your back to sitting on the  side of a flat bed without using bedrails?: A Little Help needed moving to and from a bed to a chair (including a wheelchair)?: A Little Help needed standing up from a chair using your arms (e.g., wheelchair or bedside chair)?: A Little Help needed to walk in hospital room?: A Little Help needed climbing 3-5 steps with a railing? : A Lot 6 Click Score: 18    End of Session Equipment Utilized During Treatment: Gait belt Activity Tolerance: Patient tolerated treatment well Patient left: in chair;with call bell/phone within reach;with chair alarm set Nurse Communication: Other (comment) (1:1 supervision for fall prevention) PT Visit Diagnosis: Unsteadiness on feet (R26.81);Muscle weakness (generalized) (M62.81);Difficulty in walking, not elsewhere classified (R26.2)     Time: 3212-2482 PT Time Calculation (min) (ACUTE ONLY): 20 min  Charges:                        Mikel Cella, PTA   Josie Dixon 10/20/2020, 3:01 PM

## 2020-10-21 ENCOUNTER — Inpatient Hospital Stay: Payer: Medicare Other

## 2020-10-21 DIAGNOSIS — I5031 Acute diastolic (congestive) heart failure: Secondary | ICD-10-CM | POA: Diagnosis not present

## 2020-10-21 DIAGNOSIS — E039 Hypothyroidism, unspecified: Secondary | ICD-10-CM | POA: Diagnosis not present

## 2020-10-21 DIAGNOSIS — G309 Alzheimer's disease, unspecified: Secondary | ICD-10-CM | POA: Diagnosis not present

## 2020-10-21 DIAGNOSIS — N179 Acute kidney failure, unspecified: Secondary | ICD-10-CM | POA: Diagnosis not present

## 2020-10-21 LAB — BASIC METABOLIC PANEL
Anion gap: 14 (ref 5–15)
BUN: 66 mg/dL — ABNORMAL HIGH (ref 8–23)
CO2: 24 mmol/L (ref 22–32)
Calcium: 8 mg/dL — ABNORMAL LOW (ref 8.9–10.3)
Chloride: 98 mmol/L (ref 98–111)
Creatinine, Ser: 3.15 mg/dL — ABNORMAL HIGH (ref 0.61–1.24)
GFR, Estimated: 18 mL/min — ABNORMAL LOW (ref >60)
Glucose, Bld: 81 mg/dL (ref 70–99)
Potassium: 4.2 mmol/L (ref 3.5–5.1)
Sodium: 136 mmol/L (ref 135–145)

## 2020-10-21 LAB — URINALYSIS, COMPLETE (UACMP) WITH MICROSCOPIC
Bacteria, UA: NONE SEEN
Bilirubin Urine: NEGATIVE
Glucose, UA: NEGATIVE mg/dL
Hgb urine dipstick: NEGATIVE
Ketones, ur: NEGATIVE mg/dL
Leukocytes,Ua: NEGATIVE
Nitrite: NEGATIVE
Protein, ur: NEGATIVE mg/dL
Specific Gravity, Urine: 1.014 (ref 1.005–1.030)
Squamous Epithelial / HPF: NONE SEEN (ref 0–5)
pH: 5 (ref 5.0–8.0)

## 2020-10-21 LAB — CBC
HCT: 45 % (ref 39.0–52.0)
Hemoglobin: 14.6 g/dL (ref 13.0–17.0)
MCH: 33.9 pg (ref 26.0–34.0)
MCHC: 32.4 g/dL (ref 30.0–36.0)
MCV: 104.4 fL — ABNORMAL HIGH (ref 80.0–100.0)
Platelets: 110 10*3/uL — ABNORMAL LOW (ref 150–400)
RBC: 4.31 MIL/uL (ref 4.22–5.81)
RDW: 13.8 % (ref 11.5–15.5)
WBC: 8.4 10*3/uL (ref 4.0–10.5)
nRBC: 0 % (ref 0.0–0.2)

## 2020-10-21 LAB — SODIUM, URINE, RANDOM: Sodium, Ur: 57 mmol/L

## 2020-10-21 LAB — CREATININE, URINE, RANDOM: Creatinine, Urine: 95 mg/dL

## 2020-10-21 NOTE — Consult Note (Signed)
[facility] CONSULT NOTE    Date: 10/21/2020                  Patient Name:  Louis Harrison  MRN: [id number]  DOB: 28-Sep-1928  Age / Sex: 86 y.o., male         PCP: [facility]                 Service Requesting Consult:  Hospitalist                 Reason for Consult:  Acute kidney injury/chronic kidney disease stage IIIa            History of Present Illness: Patient is a 86 y.o. male with a PMHx of dementia, COPD, chronic respiratory failure, chronic diastolic heart failure, hypertension, chronic kidney disease stage IIIa, gout, laryngeal cancer, renal artery stenosis who was admitted to [facility] on 10/15/2020 for evaluation of altered mental status.  Patient unfortunately a poor historian given his underlying dementia.  We are asked to see him for evaluation management of acute kidney injury.  Patient was apparently living home by himself prior to admission.  Unclear as to whether his p.o. intake was reasonable but nursing reports having to feed him while he is at the hospital.  Therefore dehydration a very likely possibility.  Currently creatinine 3.2.  Urine output was 900 cc over the preceding 24 hours.  He has underlying chronic kidney disease with a baseline creatinine of 1.6 and EGFR of 38.   Medications: Outpatient medications: Medications Prior to Admission  Medication Sig Dispense Refill Last Dose  . aspirin EC 81 MG tablet Take 81 mg by mouth daily. Swallow whole.   10/14/2020 at Unknown time  . atenolol (TENORMIN) 25 MG tablet Take 25 mg by mouth daily.   10/14/2020 at Unknown time  . isosorbide mononitrate (IMDUR) 30 MG 24 hr tablet Take 30 mg by mouth daily.   10/14/2020 at Unknown time  . levothyroxine (SYNTHROID) 125 MCG tablet Take 125 mcg by mouth daily.   10/14/2020 at Unknown time  . lisinopril (PRINIVIL,ZESTRIL) 5 MG tablet Take 5 mg by mouth daily.   10/14/2020 at Unknown time  . omeprazole (PRILOSEC) 20 MG capsule Take  20 mg by mouth daily.   10/14/2020 at Unknown time  . simvastatin (ZOCOR) 20 MG tablet Take 20 mg by mouth daily.    10/14/2020 at Unknown time  . indomethacin (INDOCIN SR) 75 MG CR capsule Take 1 capsule (75 mg total) by mouth 2 (two) times daily with a meal. (Patient not taking: No sig reported) 60 capsule 1 Not Taking at Unknown time  . tamsulosin (FLOMAX) 0.4 MG CAPS capsule Take 0.4 mg by mouth daily. (Patient not taking: No sig reported)   Not Taking at Unknown time    Current medications: Current Facility-Administered Medications  Medication Dose Route Frequency Provider Last Rate Last Admin  . 0.9 %  sodium chloride infusion  250 mL Intravenous PRN Agbata, Tochukwu, MD      . 0.9 %  sodium chloride infusion   Intravenous Continuous Nicole Kindred A, DO 75 mL/hr at 10/20/20 1807 New Bag at 10/20/20 1807  . acetaminophen (TYLENOL) tablet 650 mg  650 mg Oral Q4H PRN Agbata, Tochukwu, MD      . albuterol (PROVENTIL) (2.5 MG/3ML) 0.083% nebulizer solution 2.5 mg  2.5 mg Nebulization Q4H PRN Agbata, Tochukwu, MD      . aspirin EC tablet 81 mg  81 mg Oral Daily Agbata, Tochukwu, MD   81 mg at 10/21/20 0921  . atenolol (TENORMIN) tablet 25 mg  25 mg Oral Daily Agbata, Tochukwu, MD   25 mg at 10/21/20 0921  . dextromethorphan-guaiFENesin (MUCINEX DM) 30-600 MG per 12 hr tablet 1 tablet  1 tablet Oral BID Nicole Kindred A, DO   1 tablet at 10/21/20 2202  . enoxaparin (LOVENOX) injection 30 mg  30 mg Subcutaneous Q24H Dallie Piles, RPH   30 mg at 10/20/20 2038  . haloperidol lactate (HALDOL) injection 2 mg  2 mg Intravenous Q4H PRN Nicole Kindred A, DO   2 mg at 10/17/20 2027  . hydrALAZINE (APRESOLINE) tablet 25 mg  25 mg Oral Q8H PRN Nicole Kindred A, DO      . isosorbide mononitrate (IMDUR) 24 hr tablet 30 mg  30 mg Oral Daily Agbata, Tochukwu, MD   30 mg at 10/21/20 0921  . levothyroxine (SYNTHROID) tablet 125 mcg  125 mcg Oral Q0600 Collier Bullock, MD   125 mcg at 10/21/20 0516  .  ondansetron (ZOFRAN) injection 4 mg  4 mg Intravenous Q6H PRN Agbata, Tochukwu, MD      . pantoprazole (PROTONIX) EC tablet 40 mg  40 mg Oral Daily Agbata, Tochukwu, MD   40 mg at 10/21/20 0921  . QUEtiapine (SEROQUEL) tablet 25 mg  25 mg Oral Daily Nicole Kindred A, DO   25 mg at 10/21/20 5427  . QUEtiapine (SEROQUEL) tablet 50 mg  50 mg Oral QHS Nicole Kindred A, DO   50 mg at 10/19/20 2028  . simvastatin (ZOCOR) tablet 10 mg  10 mg Oral Daily Nicole Kindred A, DO   10 mg at 10/21/20 0623  . sodium chloride flush (NS) 0.9 % injection 3 mL  3 mL Intravenous Q12H Agbata, Tochukwu, MD   3 mL at 10/20/20 0923  . sodium chloride flush (NS) 0.9 % injection 3 mL  3 mL Intravenous PRN Agbata, Tochukwu, MD      . tamsulosin (FLOMAX) capsule 0.4 mg  0.4 mg Oral QPC breakfast Nicole Kindred A, DO   0.4 mg at 10/21/20 7628      Allergies: No Known Allergies    Past Medical History: Past Medical History:  Diagnosis Date  . Cancer [facility])    Larynx cancer.    Marland Kitchen COPD (chronic obstructive pulmonary disease) ([place])   . Heart attack ([place]) 1995  . Hyperlipidemia   . Hypertension   . Renal artery stenosis [facility])      Past Surgical History: Past Surgical History:  Procedure Laterality Date  . CHOLECYSTECTOMY    . CORONARY ANGIOPLASTY WITH STENT PLACEMENT       Family History: Family History  Family history unknown: Yes     Social History: Social History   Socioeconomic History  . Marital status: Widowed    Spouse name: Not on file  . Number of children: Not on file  . Years of education: Not on file  . Highest education level: Not on file  Occupational History  . Not on file  Tobacco Use  . Smoking status: Former Smoker    Packs/day: 1.50    Years: 30.00    Pack years: 45.00    Types: Cigarettes    Quit date: 10/24/1993    Years since quitting: 27.0  . Smokeless tobacco: Never Used  Vaping Use  . Vaping Use: Never used  Substance and Sexual Activity  . Alcohol use: No  .  Drug use:  No  . Sexual activity: Never    Birth control/protection: Abstinence  Other Topics Concern  . Not on file  Social History Narrative  . Not on file   Social Determinants of Health   Financial Resource Strain: Not on file  Food Insecurity: Not on file  Transportation Needs: Not on file  Physical Activity: Not on file  Stress: Not on file  Social Connections: Not on file  Intimate Partner Violence: Not on file     Review of Systems: Unable to offer accurate review of systems given underlying dementia  Vital Signs: Blood pressure (!) 98/55, pulse (!) 55, temperature 98.6 F (37 C), temperature source Oral, resp. rate 18, height 5' 11"  (1.803 m), weight 79.8 kg, SpO2 92 %.  Weight trends: Filed Weights   10/19/20 0442 10/20/20 0540 10/21/20 0013  Weight: 78.3 kg 78 kg 79.8 kg    Physical Exam: Physical Exam: General:  Chronically ill-appearing  Head:  Normocephalic, atraumatic. Moist oral mucosal membranes  Eyes:  Anicteric  Neck:  Supple  Lungs:   Clear to auscultation, normal effort  Heart:  S1S2 no rubs  Abdomen:   Soft, nontender, bowel sounds present  Extremities:  No peripheral edema.  Signs of gouty arthritis bilateral hands  Neurologic:  Awake, alert, but confused  Skin:  No acute skin rash  Access:  No hemodialysis access    Lab results: Basic Metabolic Panel: Recent Labs  Lab 10/18/20 0854 10/19/20 0415 10/20/20 0604 10/21/20 0740  NA 140 141 140 136  K 4.3 4.4 4.8 4.2  CL 97* 98 98 98  CO2 32 28 30 24   GLUCOSE 93 100* 92 81  BUN 32* 46* 64* 66*  CREATININE 1.89* 2.70* 3.20* 3.15*  CALCIUM 8.7* 8.7* 8.5* 8.0*  MG 2.0  --   --   --     Liver Function Tests: Recent Labs  Lab 10/15/20 1115 10/20/20 2116  AST 19 28  ALT 17 18  ALKPHOS 58 60  BILITOT 1.5* 1.6*  PROT 5.8* 6.0*  ALBUMIN 3.5 3.3*   Recent Labs  Lab 10/15/20 1115  LIPASE 19   Recent Labs  Lab 10/20/20 2116  AMMONIA 24    CBC: Recent Labs  Lab  10/15/20 1343 10/17/20 0713 10/18/20 0854 10/19/20 0415 10/20/20 0604 10/21/20 0503  WBC 7.8 9.5 10.9* 10.0 11.7* 8.4  NEUTROABS 6.2  --   --   --   --   --   HGB 15.6 17.7* 17.4* 18.1* 17.1* 14.6  HCT 47.8 53.7* 53.5* 54.4* 51.5 45.0  MCV 104.8* 102.5* 103.1* 102.6* 102.8* 104.4*  PLT 110* 125* 130* 132* 125* 110*    Cardiac Enzymes: No results for input(s): CKTOTAL, CKMB, CKMBINDEX, TROPONINI in the last 168 hours.  BNP: Invalid input(s): POCBNP  CBG: No results for input(s): GLUCAP in the last 168 hours.  Microbiology: Results for orders placed or performed during the hospital encounter of 10/15/20  Resp Panel by RT-PCR (Flu A&B, Covid) Nasopharyngeal Swab     Status: None   Collection Time: 10/15/20 11:38 AM   Specimen: Nasopharyngeal Swab; Nasopharyngeal(NP) swabs in vial transport medium  Result Value Ref Range Status   SARS Coronavirus 2 by RT PCR NEGATIVE NEGATIVE Final    Comment: (NOTE) SARS-CoV-2 target nucleic acids are NOT DETECTED.  The SARS-CoV-2 RNA is generally detectable in upper respiratory specimens during the acute phase of infection. The lowest concentration of SARS-CoV-2 viral copies this assay can detect is 138 copies/mL. A negative result  does not preclude SARS-Cov-2 infection and should not be used as the sole basis for treatment or other patient management decisions. A negative result may occur with  improper specimen collection/handling, submission of specimen other than nasopharyngeal swab, presence of viral mutation(s) within the areas targeted by this assay, and inadequate number of viral copies(<138 copies/mL). A negative result must be combined with clinical observations, patient history, and epidemiological information. The expected result is Negative.  Fact Sheet for Patients:  EntrepreneurPulse.com.au  Fact Sheet for Healthcare Providers:  IncredibleEmployment.be  This test is no t yet approved  or cleared by the [place] FDA and  has been authorized for detection and/or diagnosis of SARS-CoV-2 by FDA under an Emergency Use Authorization (EUA). This EUA will remain  in effect (meaning this test can be used) for the duration of the COVID-19 declaration under Section 564(b)(1) of the Act, 21 U.S.C.section 360bbb-3(b)(1), unless the authorization is terminated  or revoked sooner.       Influenza A by PCR NEGATIVE NEGATIVE Final   Influenza B by PCR NEGATIVE NEGATIVE Final    Comment: (NOTE) The Xpert Xpress SARS-CoV-2/FLU/RSV plus assay is intended as an aid in the diagnosis of influenza from Nasopharyngeal swab specimens and should not be used as a sole basis for treatment. Nasal washings and aspirates are unacceptable for Xpert Xpress SARS-CoV-2/FLU/RSV testing.  Fact Sheet for Patients: EntrepreneurPulse.com.au  Fact Sheet for Healthcare Providers: IncredibleEmployment.be  This test is not yet approved or cleared by the [place] FDA and has been authorized for detection and/or diagnosis of SARS-CoV-2 by FDA under an Emergency Use Authorization (EUA). This EUA will remain in effect (meaning this test can be used) for the duration of the COVID-19 declaration under Section 564(b)(1) of the Act, 21 U.S.C. section 360bbb-3(b)(1), unless the authorization is terminated or revoked.  Performed at [facility], [address] [place], [place] [address]     Coagulation Studies: No results for input(s): LABPROT, INR in the last 72 hours.  Urinalysis: No results for input(s): COLORURINE, LABSPEC, PHURINE, GLUCOSEU, HGBUR, BILIRUBINUR, KETONESUR, PROTEINUR, UROBILINOGEN, NITRITE, LEUKOCYTESUR in the last 72 hours.  Invalid input(s): APPERANCEUR    Imaging: CT HEAD WO CONTRAST  Result Date: 10/20/2020 CLINICAL DATA:  Mental status change. EXAM: CT HEAD WITHOUT CONTRAST TECHNIQUE: Contiguous axial images were  obtained from the base of the skull through the vertex without intravenous contrast. COMPARISON:  CT head 10/15/2020 FINDINGS: Brain: Cerebral ventricle sizes are concordant with the degree of cerebral volume loss. Patchy and confluent areas of decreased attenuation are noted throughout the deep and periventricular white matter of the cerebral hemispheres bilaterally, compatible with chronic microvascular ischemic disease. No evidence of large-territorial acute infarction. No parenchymal hemorrhage. No mass lesion. No extra-axial collection. No mass effect or midline shift. No hydrocephalus. Basilar cisterns are patent. Vascular: No hyperdense vessel. Skull: No acute fracture or focal lesion. Sinuses/Orbits: Paranasal sinuses and mastoid air cells are clear. The orbits are unremarkable. Other: None. IMPRESSION: No acute intracranial abnormality. Electronically Signed   By: Iven Finn M.D.   On: 10/20/2020 19:13   DG Knee Right Port  Result Date: 10/21/2020 CLINICAL DATA:  Generalized RIGHT knee pain. EXAM: PORTABLE RIGHT KNEE - 1-2 VIEW COMPARISON:  Plain film of the RIGHT knee dated 03/01/2016. FINDINGS: Osseous alignment is normal. Bone mineralization is grossly normal. No fracture line or displaced fracture fragment. No acute or suspicious osseous lesion. Mild tricompartmental joint space narrowings. No large osteophytes or other signs of advanced degenerative  joint disease Chondrocalcinosis of better appreciated within the joint space on the previous exam. Faint calcific densities are again seen along the course of the quadriceps insertion site, again likely related to the supposed underlying CPPD arthropathy. Probable small joint effusion which is likely chronic. Vascular calcifications of the distal SFA. IMPRESSION: 1. No acute findings. 2. Mild tricompartmental degenerative change. 3. Chondrocalcinosis of the knee joint space, compatible with underlying CPPD arthropathy. 4. Probable small joint  effusion which is likely chronic. 5. Vascular calcifications. Electronically Signed   By: Franki Cabot M.D.   On: 10/21/2020 08:16      Assessment & Plan: Pt is a 86 y.o. male with a PMHx of dementia, COPD, chronic respiratory failure, chronic diastolic heart failure, hypertension, chronic kidney disease stage IIIa, gout, laryngeal cancer, renal artery stenosis who was admitted to [facility] on 10/15/2020 for evaluation of altered mental status.   1.  Acute kidney injury/chronic kidney disease stage IIIa.  Suspect acute kidney injury now related to dehydration.  Also was on indomethacin as an outpatient for gout.  Currently on 0.9 normal saline at 75 cc/h.  Renal ultrasound was reviewed.  Patient did not allow left kidney to be imaged as he requested the study to be aborted.  Right kidney appeared to be without obstruction.  No immediate need for dialysis.  Continue to monitor renal parameters closely.  2.  Hypotension.  Blood pressure currently 98/55.  Continue 0.9 normal saline.  May need to hold additional doses of atenolol.

## 2020-10-21 NOTE — Progress Notes (Signed)
Mobility Specialist - Progress Note   10/21/20 1400  Mobility  Range of Motion/Exercises Right leg;Left leg (SLR, ABD)  Level of Assistance Maximum assist, patient does 25-49%  Assistive Device None  Distance Ambulated (ft) 0 ft  Mobility Response Tolerated well  Mobility performed by Mobility specialist  $Mobility charge 1 Mobility    Pre-mobility: 65 HR, 94% SpO2 During mobility: 90%  SpO2 Post-mobility: 53 HR, 92% SpO2   Pt was sleeping in bed upon arrival utilizing 2L [place] O2. Pt was awakened by voice and agreed to session. Pt engages in small talk, but difficult to understand at times. Pt performed supine exercises: straight leg raises and hip abduction x10 with maxA from mobility. Pt struggled to successfully carryover verbal commands. O2 desat to 90% during performance. Further mobility deferred d/t pt voicing "that's it" in reference to further activity. Overall, pt tolerated session well. Pt was left in bed with all needs in reach and alarm set. Nurse notified.    Kathee Delton Mobility Specialist 10/21/20, 2:56 PM

## 2020-10-21 NOTE — Progress Notes (Signed)
PROGRESS NOTE    Louis Harrison  MEQ:[id number] DOB: 1928/07/10 DOA: 10/15/2020 PCP: [facility]   Brief Narrative: Louis Harrison is a 86 y.o. male with a history of COPD, chronic respiratory failure on oxygen, CHF, hypertension. Patient presented secondary to altered mental status and found to have an acute heart failure exacerbation. IV diuresis initiated.   Assessment & Plan:   Principal Problem:   Dementia ([place]) Active Problems:   Chronic respiratory failure (HCC)   COPD (chronic obstructive pulmonary disease) (HCC)   Hypertension   Coronary artery disease   Acute CHF (congestive heart failure) (HCC)   CKD (chronic kidney disease), stage IV (HCC)   Hypothyroidism   Acute on chronic diastolic heart failure Pulmonary edema on x-ray. Patient started on Lasix IV with good diuresis and transitioned to torsemide 40 mg daily. Lasix diuresis discontinued secondary to worsening renal function. Weight down 6.4 kg from admission. Net IO Since Admission: -4,060.8 mL [10/21/20 1924]. Transthoracic Echocardiogram significant for an EF of 45-50% -Watch fluid status carefully with cessation of diuretic and initiation of IV fluids for AKI  AKI on CKD stage IIIa Nephrology consulted. AKI in setting of IV diuresis. Nephrology consulted. -Continue IV fluids   Acute on chronic respiratory failure On 2L/min of oxygen as an outpatient. Required as much as 4 L/min on admission which was quickly weaned down to home 2 L. -Continue 2 l/min of oxygen  COPD exacerbation Patient managed on systemic steroids and nebulizer treatments in addition to Azithromycin which is now completed. Now resolved. -Continue albuterol prn  Elevated troponin In setting of heart failure. Peak high sensitivity troponin of 93.  Primary hypertension -Continue Imdur, atenolol  Hypothyroidism -Continue Synthroid 125 mcg daily  Tophaceous gout Patient is on indomethacin as an outpatient.  Currently stable.  BPH -Continue Flomax  GERD -Continue Protonix  Dementia -Continue Seroquel    DVT prophylaxis: Lovenox Code Status:   Code Status: DNR Family Communication: None at bedside Disposition Plan: Discharge likely to SNF in several days pending improvement of AKI   Consultants:   Cardiology  Procedures:   TRANSTHORACIC ECHOCARDIOGRAM (10/16/2020) IMPRESSIONS    1. Left ventricular ejection fraction, by estimation, is 45 to 50%. The  left ventricle has mildly decreased function. The left ventricle  demonstrates regional wall motion abnormalities (see scoring  diagram/findings for description). Left ventricular  diastolic parameters were normal.  2. Right ventricular systolic function is normal. The right ventricular  size is normal.  3. Left atrial size was mildly dilated.  4. The mitral valve is normal in structure. Mild mitral valve  regurgitation.  5. The aortic valve is normal in structure. Aortic valve regurgitation is  not visualized.  Antimicrobials:  Azithromycin    Subjective: Wants to urinate. No other concerns  Objective: Vitals:   10/21/20 0510 10/21/20 0733 10/21/20 1135 10/21/20 1615  BP: (!) 109/92 (!) 108/48 (!) 98/55 (!) 101/59  Pulse: (!) 53 (!) 54 (!) 55 (!) 55  Resp: 18     Temp: (!) 97.3 F (36.3 C) 98 F (36.7 C) 98.6 F (37 C) 98.2 F (36.8 C)  TempSrc: Oral Oral Oral Oral  SpO2: 96% 92% 92% 96%  Weight:      Height:        Intake/Output Summary (Last 24 hours) at 10/21/2020 1912 Last data filed at 10/21/2020 1500 Gross per 24 hour  Intake 897.79 ml  Output 1200 ml  Net -302.21 ml   [employer]  10/19/20 0442 10/20/20 0540 10/21/20 0013  Weight: 78.3 kg 78 kg 79.8 kg    Examination:  General exam: Appears calm and comfortable Respiratory system: Clear to auscultation. Respiratory effort normal. Cardiovascular system: S1 & S2 heard, RRR. No murmurs, rubs, gallops or clicks. Gastrointestinal  system: Abdomen is nondistended, soft and nontender. No organomegaly or masses felt. Normal bowel sounds heard. Central nervous system: Alert and oriented to self. No focal neurological deficits. Musculoskeletal: No calf tenderness Skin: No cyanosis. No rashes Psychiatry: Judgement and insight appear normal. Mood & affect appropriate.     Data Reviewed: I have personally reviewed following labs and imaging studies  CBC Lab Results  Component Value Date   WBC 8.4 10/21/2020   RBC 4.31 10/21/2020   HGB 14.6 10/21/2020   HCT 45.0 10/21/2020   MCV 104.4 (H) 10/21/2020   MCH 33.9 10/21/2020   PLT 110 (L) 10/21/2020   MCHC 32.4 10/21/2020   RDW 13.8 10/21/2020   LYMPHSABS 0.8 10/15/2020   MONOABS 0.7 10/15/2020   EOSABS 0.1 10/15/2020   BASOSABS 0.1 62/37/6283     Last metabolic panel Lab Results  Component Value Date   NA 136 10/21/2020   K 4.2 10/21/2020   CL 98 10/21/2020   CO2 24 10/21/2020   BUN 66 (H) 10/21/2020   CREATININE 3.15 (H) 10/21/2020   GLUCOSE 81 10/21/2020   GFRNONAA 18 (L) 10/21/2020   GFRAA 54 (L) 09/17/2016   CALCIUM 8.0 (L) 10/21/2020   PROT 6.0 (L) 10/20/2020   ALBUMIN 3.3 (L) 10/20/2020   BILITOT 1.6 (H) 10/20/2020   ALKPHOS 60 10/20/2020   AST 28 10/20/2020   ALT 18 10/20/2020   ANIONGAP 14 10/21/2020    CBG (last 3)  No results for input(s): GLUCAP in the last 72 hours.   GFR: Estimated Creatinine Clearance: 16.6 mL/min (A) (by C-G formula based on SCr of 3.15 mg/dL (H)).  Coagulation Profile: Recent Labs  Lab 10/15/20 1115  INR 1.1    Recent Results (from the past 240 hour(s))  Resp Panel by RT-PCR (Flu A&B, Covid) Nasopharyngeal Swab     Status: None   Collection Time: 10/15/20 11:38 AM   Specimen: Nasopharyngeal Swab; Nasopharyngeal(NP) swabs in vial transport medium  Result Value Ref Range Status   SARS Coronavirus 2 by RT PCR NEGATIVE NEGATIVE Final    Comment: (NOTE) SARS-CoV-2 target nucleic acids are NOT  DETECTED.  The SARS-CoV-2 RNA is generally detectable in upper respiratory specimens during the acute phase of infection. The lowest concentration of SARS-CoV-2 viral copies this assay can detect is 138 copies/mL. A negative result does not preclude SARS-Cov-2 infection and should not be used as the sole basis for treatment or other patient management decisions. A negative result may occur with  improper specimen collection/handling, submission of specimen other than nasopharyngeal swab, presence of viral mutation(s) within the areas targeted by this assay, and inadequate number of viral copies(<138 copies/mL). A negative result must be combined with clinical observations, patient history, and epidemiological information. The expected result is Negative.  Fact Sheet for Patients:  EntrepreneurPulse.com.au  Fact Sheet for Healthcare Providers:  IncredibleEmployment.be  This test is no t yet approved or cleared by the [place] FDA and  has been authorized for detection and/or diagnosis of SARS-CoV-2 by FDA under an Emergency Use Authorization (EUA). This EUA will remain  in effect (meaning this test can be used) for the duration of the COVID-19 declaration under Section 564(b)(1) of the Act, 21 U.S.C.section 360bbb-3(b)(1),  unless the authorization is terminated  or revoked sooner.       Influenza A by PCR NEGATIVE NEGATIVE Final   Influenza B by PCR NEGATIVE NEGATIVE Final    Comment: (NOTE) The Xpert Xpress SARS-CoV-2/FLU/RSV plus assay is intended as an aid in the diagnosis of influenza from Nasopharyngeal swab specimens and should not be used as a sole basis for treatment. Nasal washings and aspirates are unacceptable for Xpert Xpress SARS-CoV-2/FLU/RSV testing.  Fact Sheet for Patients: EntrepreneurPulse.com.au  Fact Sheet for Healthcare Providers: IncredibleEmployment.be  This test is not yet  approved or cleared by the [place] FDA and has been authorized for detection and/or diagnosis of SARS-CoV-2 by FDA under an Emergency Use Authorization (EUA). This EUA will remain in effect (meaning this test can be used) for the duration of the COVID-19 declaration under Section 564(b)(1) of the Act, 21 U.S.C. section 360bbb-3(b)(1), unless the authorization is terminated or revoked.  Performed at [facility], [address] [place], [place] [address]         Radiology Studies: CT HEAD WO CONTRAST  Result Date: 10/20/2020 CLINICAL DATA:  Mental status change. EXAM: CT HEAD WITHOUT CONTRAST TECHNIQUE: Contiguous axial images were obtained from the base of the skull through the vertex without intravenous contrast. COMPARISON:  CT head 10/15/2020 FINDINGS: Brain: Cerebral ventricle sizes are concordant with the degree of cerebral volume loss. Patchy and confluent areas of decreased attenuation are noted throughout the deep and periventricular white matter of the cerebral hemispheres bilaterally, compatible with chronic microvascular ischemic disease. No evidence of large-territorial acute infarction. No parenchymal hemorrhage. No mass lesion. No extra-axial collection. No mass effect or midline shift. No hydrocephalus. Basilar cisterns are patent. Vascular: No hyperdense vessel. Skull: No acute fracture or focal lesion. Sinuses/Orbits: Paranasal sinuses and mastoid air cells are clear. The orbits are unremarkable. Other: None. IMPRESSION: No acute intracranial abnormality. Electronically Signed   By: Iven Finn M.D.   On: 10/20/2020 19:13   DG Knee Right Port  Result Date: 10/21/2020 CLINICAL DATA:  Generalized RIGHT knee pain. EXAM: PORTABLE RIGHT KNEE - 1-2 VIEW COMPARISON:  Plain film of the RIGHT knee dated 03/01/2016. FINDINGS: Osseous alignment is normal. Bone mineralization is grossly normal. No fracture line or displaced fracture fragment. No acute or suspicious  osseous lesion. Mild tricompartmental joint space narrowings. No large osteophytes or other signs of advanced degenerative joint disease Chondrocalcinosis of better appreciated within the joint space on the previous exam. Faint calcific densities are again seen along the course of the quadriceps insertion site, again likely related to the supposed underlying CPPD arthropathy. Probable small joint effusion which is likely chronic. Vascular calcifications of the distal SFA. IMPRESSION: 1. No acute findings. 2. Mild tricompartmental degenerative change. 3. Chondrocalcinosis of the knee joint space, compatible with underlying CPPD arthropathy. 4. Probable small joint effusion which is likely chronic. 5. Vascular calcifications. Electronically Signed   By: Franki Cabot M.D.   On: 10/21/2020 08:16        Scheduled Meds: . aspirin EC  81 mg Oral Daily  . atenolol  25 mg Oral Daily  . dextromethorphan-guaiFENesin  1 tablet Oral BID  . enoxaparin (LOVENOX) injection  30 mg Subcutaneous Q24H  . isosorbide mononitrate  30 mg Oral Daily  . levothyroxine  125 mcg Oral Q0600  . pantoprazole  40 mg Oral Daily  . QUEtiapine  25 mg Oral Daily  . QUEtiapine  50 mg Oral QHS  . simvastatin  10 mg Oral  Daily  . sodium chloride flush  3 mL Intravenous Q12H  . tamsulosin  0.4 mg Oral QPC breakfast   Continuous Infusions: . sodium chloride    . sodium chloride Stopped (10/21/20 1738)     LOS: 6 days     Cordelia Poche, MD [facility] 10/21/2020, 7:12 PM  If 7PM-7AM, please contact night-coverage [email or web address]

## 2020-10-22 DIAGNOSIS — G309 Alzheimer's disease, unspecified: Secondary | ICD-10-CM | POA: Diagnosis not present

## 2020-10-22 DIAGNOSIS — I5031 Acute diastolic (congestive) heart failure: Secondary | ICD-10-CM | POA: Diagnosis not present

## 2020-10-22 DIAGNOSIS — N179 Acute kidney failure, unspecified: Secondary | ICD-10-CM | POA: Diagnosis not present

## 2020-10-22 DIAGNOSIS — E039 Hypothyroidism, unspecified: Secondary | ICD-10-CM | POA: Diagnosis not present

## 2020-10-22 LAB — BASIC METABOLIC PANEL
Anion gap: 12 (ref 5–15)
BUN: 60 mg/dL — ABNORMAL HIGH (ref 8–23)
CO2: 28 mmol/L (ref 22–32)
Calcium: 8.6 mg/dL — ABNORMAL LOW (ref 8.9–10.3)
Chloride: 99 mmol/L (ref 98–111)
Creatinine, Ser: 2.51 mg/dL — ABNORMAL HIGH (ref 0.61–1.24)
GFR, Estimated: 24 mL/min — ABNORMAL LOW (ref >60)
Glucose, Bld: 105 mg/dL — ABNORMAL HIGH (ref 70–99)
Potassium: 4.3 mmol/L (ref 3.5–5.1)
Sodium: 139 mmol/L (ref 135–145)

## 2020-10-22 MED ORDER — CHLORHEXIDINE GLUCONATE CLOTH 2 % EX PADS
6.0000 | MEDICATED_PAD | Freq: Every day | CUTANEOUS | Status: DC
  Administered 2020-10-22 – 2020-10-26 (×5): 6 via TOPICAL

## 2020-10-22 MED ORDER — HALOPERIDOL LACTATE 5 MG/ML IJ SOLN
2.5000 mg | Freq: Four times a day (QID) | INTRAMUSCULAR | Status: DC | PRN
  Administered 2020-10-22 (×2): 2.5 mg via INTRAMUSCULAR
  Filled 2020-10-22: qty 1

## 2020-10-22 MED ORDER — LORAZEPAM 2 MG/ML IJ SOLN
1.0000 mg | Freq: Once | INTRAMUSCULAR | Status: AC
  Administered 2020-10-22: 04:00:00 1 mg via INTRAMUSCULAR
  Filled 2020-10-22: qty 1

## 2020-10-22 NOTE — Progress Notes (Signed)
Patient pulled PIV out overnight, remains without a PIV at this time, Dr. Lonny Prude aware.

## 2020-10-22 NOTE — Progress Notes (Signed)
[facility] Card[facility] Encounter Note  Patient: Louis Harrison / Admit Date: 10/15/2020 / Date of Encounter: 10/22/2020, 8:29 AM   Subjective: 12/25.  Overall patient has been active throughout the evening with no specific complaints.  Patient cannot converse well about his symptoms and/or health at this time.  Telemetry has continued to show sinus bradycardia with no other significant rhythm disturbances.  Troponin is 70/82/93 consistent with either demand ischemia or kidney dysfunction without evidence of acute coronary syndrome.  Patient has had acute on chronic systolic dysfunction heart failure with some bibasilar Rales unchanged from before but concerns for chronic kidney disease now with only 500 cc net output and continued trace lower extremity edema.  12/26.  Patient still not conversing well and unable to get any history from overnight.  Telemetry still showing sinus bradycardia.  Glomerular filtration rate stable at 38 with some bibasilar crackles by exam.  Troponin continued to be flat with the latest being 79 and no current evidence of apparent anginal symptoms or acute coronary syndrome.  Hypertension control appears reasonable compared to yesterday and possibly improvement.  There is concerns of possible worsening chronic kidney disease and overdiuresis and therefore may consider the possibility of changing intravenous furosemide to torsemide orally.  Unfortunately having the patient take his medications in the future may be an issue   12/27.  Patient still not conversing at all and unable to discuss any symptoms or issues.  Patient did have a discontinuation of IV Lasix but kidney injury slightly worse with a glomerular filtration rate of 22 and therefore discontinuation of all diuretics would be appropriate.  In addition to that further evaluation of the possibility pulmonary edema resolution due to difficulty in having patient take deep breaths and truly assess.  The  patient continues to have sinus bradycardia by telemetry.  12/28.  Patient is still having evenings of combativeness and unable to converse with him today.  Have had a long discussion with the son about his current condition.  Previously the patient was frequently functional.  With his congestive heart failure the patient has now had some diuresis for which has now potentially cause some mild amount of dehydration and acute kidney injury.  We have abstained from diuretics since 1226 to potentially improve this issue.  Chest x-ray has shown no evidence of pulmonary edema although there is bilateral basilar atelectasis.  Currently with discussion with family the patient has sedation to a significant degree and may have some difficulties awakening in the coming days.  Otherwise there are no apparent cardiovascular concerns  12/30.  Patient overall condition not significantly changed from last several days.  Patient continues to have acute kidney injury with a glomerular filtration rate of 18.  This is multifactorial in nature including dehydration diuresis for pulmonary edema and acute on chronic systolic and diastolic dysfunction congestive heart failure and indomethacin.  Patient has not had any evidence of acute coronary syndrome and no current symptoms needing further intervention.  Diuresis has been held with x-ray showing no evidence of pulmonary edema.  Heart rate is still stable with atenolol despite acute kidney injury and no evidence of significant need and further change in medication management from the cardiovascular standpoint  Echocardiogram showing mild apical hypokinesis with ejection fraction of 45% and some mitral and tricuspid regurgitation  Review of Systems: Cannot assess   objective: Telemetry: Sinus bradycardia Physical Exam: Blood pressure 124/64, pulse 64, temperature 98.1 F (36.7 C), temperature source Axillary, resp. rate 18, height  5\' 11"  (1.803 m), weight 80.7 kg, SpO2 92 %.  Body mass index is 24.81 kg/m. General: Well developed, well nourished, in no acute distress. Head: Normocephalic, atraumatic, sclera non-icteric, no xanthomas, nares are without discharge. Neck: No apparent masses Lungs: Normal respirations with few wheezes, no rhonchi, no rales , bibasilar crackles   Heart: Regular rate and rhythm, normal S1 S2, no murmur, no rub, no gallop, PMI is normal size and placement, carotid upstroke normal without bruit, jugular venous pressure normal Abdomen: Soft, non-tender, non-distended with normoactive bowel sounds. No hepatosplenomegaly. Abdominal aorta is normal size without bruit Extremities: N trace edema, no clubbing, no cyanosis, no ulcers,  Peripheral: 2+ radial, 2+ femoral, 2+ dorsal pedal pulses Neuro: Not alert and oriented. Moves all extremities spontaneously. Psych: Does not responds to questions appropriately with a normal affect.   Intake/Output Summary (Last 24 hours) at 10/22/2020 0829 Last data filed at 10/21/2020 1500 Gross per 24 hour  Intake 240 ml  Output 600 ml  Net -360 ml    Inpatient Medications:  . aspirin EC  81 mg Oral Daily  . atenolol  25 mg Oral Daily  . dextromethorphan-guaiFENesin  1 tablet Oral BID  . enoxaparin (LOVENOX) injection  30 mg Subcutaneous Q24H  . isosorbide mononitrate  30 mg Oral Daily  . levothyroxine  125 mcg Oral Q0600  . pantoprazole  40 mg Oral Daily  . QUEtiapine  25 mg Oral Daily  . QUEtiapine  50 mg Oral QHS  . simvastatin  10 mg Oral Daily  . sodium chloride flush  3 mL Intravenous Q12H  . tamsulosin  0.4 mg Oral QPC breakfast   Infusions:  . sodium chloride    . sodium chloride Stopped (10/21/20 1738)    Labs: Recent Labs    10/20/20 0604 10/21/20 0740  NA 140 136  K 4.8 4.2  CL 98 98  CO2 30 24  GLUCOSE 92 81  BUN 64* 66*  CREATININE 3.20* 3.15*  CALCIUM 8.5* 8.0*   Recent Labs    10/20/20 2116  AST 28  ALT 18  ALKPHOS 60  BILITOT 1.6*  PROT 6.0*  ALBUMIN 3.3*    Recent Labs    10/20/20 0604 10/21/20 0503  WBC 11.7* 8.4  HGB 17.1* 14.6  HCT 51.5 45.0  MCV 102.8* 104.4*  PLT 125* 110*   No results for input(s): CKTOTAL, CKMB, TROPONINI in the last 72 hours. Invalid input(s): POCBNP No results for input(s): HGBA1C in the last 72 hours.   Weights: Filed Weights   10/20/20 0540 10/21/20 0013 10/22/20 0353  Weight: 78 kg 79.8 kg 80.7 kg     Radiology/Studies:  CT HEAD WO CONTRAST  Result Date: 10/20/2020 CLINICAL DATA:  Mental status change. EXAM: CT HEAD WITHOUT CONTRAST TECHNIQUE: Contiguous axial images were obtained from the base of the skull through the vertex without intravenous contrast. COMPARISON:  CT head 10/15/2020 FINDINGS: Brain: Cerebral ventricle sizes are concordant with the degree of cerebral volume loss. Patchy and confluent areas of decreased attenuation are noted throughout the deep and periventricular white matter of the cerebral hemispheres bilaterally, compatible with chronic microvascular ischemic disease. No evidence of large-territorial acute infarction. No parenchymal hemorrhage. No mass lesion. No extra-axial collection. No mass effect or midline shift. No hydrocephalus. Basilar cisterns are patent. Vascular: No hyperdense vessel. Skull: No acute fracture or focal lesion. Sinuses/Orbits: Paranasal sinuses and mastoid air cells are clear. The orbits are unremarkable. Other: None. IMPRESSION: No acute intracranial abnormality. Electronically Signed  By: Iven Finn M.D.   On: 10/20/2020 19:13   CT Head Wo Contrast  Result Date: 10/15/2020 CLINICAL DATA:  Mental status change.  History of dementia. EXAM: CT HEAD WITHOUT CONTRAST TECHNIQUE: Contiguous axial images were obtained from the base of the skull through the vertex without intravenous contrast. COMPARISON:  CT head 07/31/2014. FINDINGS: Brain: There is no evidence of acute intracranial hemorrhage, mass lesion, brain edema or extra-axial fluid collection.  Mildly progressive atrophy with prominence of the ventricles and subarachnoid spaces. There is patchy low-density in the periventricular white matter bilaterally, likely due to chronic small vessel ischemic changes. There is no CT evidence of acute cortical infarction. Vascular:  No hyperdense vessel identified. Skull: Negative for fracture or focal lesion. Sinuses/Orbits: The visualized paranasal sinuses and mastoid air cells are clear. No orbital abnormalities are seen. Other: None. IMPRESSION: 1. No acute intracranial findings. 2. Mildly progressive atrophy and chronic small vessel ischemic changes. Electronically Signed   By: Richardean Sale M.D.   On: 10/15/2020 11:46   US RENAL  Result Date: 10/19/2020 CLINICAL DATA:  Acute kidney injury EXAM: RENAL / URINARY TRACT ULTRASOUND COMPLETE COMPARISON:  None. FINDINGS: Right Kidney: Renal measurements: 10.3 x 4.8 x 4.7 cm = volume: 121 mL. Echogenic renal parenchyma. 3.7 x 4.0 x 3.3 cm interpolar right renal cyst with a single incomplete septation, benign. No hydronephrosis. Left Kidney: Was not imaged.  Patient aborted the study. Bladder: Appears normal for degree of bladder distention. Other: None. IMPRESSION: Left kidney not imaged due to patient request. 4.0 cm minimally complex right renal cyst, benign. No hydronephrosis. Electronically Signed   By: Julian Hy M.D.   On: 10/19/2020 11:32   DG Chest Port 1 View  Result Date: 10/19/2020 CLINICAL DATA:  Altered mental status EXAM: PORTABLE CHEST 1 VIEW COMPARISON:  10/15/2020 FINDINGS: Mild bilateral lower lobe opacities, atelectasis versus pneumonia. No pleural effusion or pneumothorax. The heart is top-normal in size.  Thoracic aortic atherosclerosis. IMPRESSION: Mild bilateral lower lobe opacities, atelectasis versus pneumonia. Electronically Signed   By: Julian Hy M.D.   On: 10/19/2020 08:26   DG Chest Port 1 View  Result Date: 10/15/2020 CLINICAL DATA:  Syncopal episodes.   Multiple falls.  Dementia. EXAM: PORTABLE CHEST 1 VIEW COMPARISON:  Radiographs 03/12/2019 and 11/14/2017.  CT 12/20/2017. FINDINGS: 1120 hours. There are significantly lower lung volumes. The heart size and mediastinal contours are stable with aortic atherosclerosis. There are new patchy airspace opacities at both lung bases associated with poor definition of the pulmonary vasculature. No large pleural effusion, confluent airspace opacity or pneumothorax identified. Telemetry leads overlie the chest. No acute osseous findings. IMPRESSION: Lower lung volumes with new patchy bibasilar airspace opacities which could reflect viral infection or edema. Followup PA and lateral views recommended when the patient is able. Electronically Signed   By: Richardean Sale M.D.   On: 10/15/2020 11:44   DG Knee Right Port  Result Date: 10/21/2020 CLINICAL DATA:  Generalized RIGHT knee pain. EXAM: PORTABLE RIGHT KNEE - 1-2 VIEW COMPARISON:  Plain film of the RIGHT knee dated 03/01/2016. FINDINGS: Osseous alignment is normal. Bone mineralization is grossly normal. No fracture line or displaced fracture fragment. No acute or suspicious osseous lesion. Mild tricompartmental joint space narrowings. No large osteophytes or other signs of advanced degenerative joint disease Chondrocalcinosis of better appreciated within the joint space on the previous exam. Faint calcific densities are again seen along the course of the quadriceps insertion site, again likely related to the  supposed underlying CPPD arthropathy. Probable small joint effusion which is likely chronic. Vascular calcifications of the distal SFA. IMPRESSION: 1. No acute findings. 2. Mild tricompartmental degenerative change. 3. Chondrocalcinosis of the knee joint space, compatible with underlying CPPD arthropathy. 4. Probable small joint effusion which is likely chronic. 5. Vascular calcifications. Electronically Signed   By: Franki Cabot M.D.   On: 10/21/2020 08:16   DG  Humerus Left  Result Date: 10/15/2020 CLINICAL DATA:  Frequent falls.  Left arm bruising.  Dementia. EXAM: LEFT HUMERUS - 2+ VIEW COMPARISON:  None. FINDINGS: The mineralization and alignment are normal. There is no evidence of acute fracture or dislocation. Minor degenerative changes at the shoulder and elbow for age. No foreign body or focal soft tissue swelling identified. IMPRESSION: No acute findings. Minor degenerative changes. Electronically Signed   By: Richardean Sale M.D.   On: 10/15/2020 11:41   ECHOCARDIOGRAM COMPLETE  Result Date: 10/16/2020    ECHOCARDIOGRAM REPORT   Patient Name:   Louis Harrison Date of Exam: 10/16/2020 Medical Rec #:  [id number]          Height:       71.0 in Accession #:    [id number]         Weight:       190.0 lb Date of Birth:  [birth year]           BSA:          2.063 m Patient Age:    42 years           BP:           158/86 mmHg Patient Gender: M                  HR:           57 bpm. Exam Location:  [facility] Procedure: 2D Echo, Color Doppler and Cardiac Doppler Indications:     I50.31 CHF-Acute Diastolic  History:         Patient has no prior history of Echocardiogram examinations.                  Previous Myocardial Infarction, COPD; Risk Factors:Hypertension                  and Dyslipidemia.  Sonographer:     Charmayne Sheer RDCS (AE) Referring Phys:  [id number] Collier Bullock Diagnosing Phys: Serafina Royals MD  Sonographer Comments: No subcostal window. Image acquisition challenging due to COPD and Image acquisition challenging due to respiratory motion. IMPRESSIONS  1. Left ventricular ejection fraction, by estimation, is 45 to 50%. The left ventricle has mildly decreased function. The left ventricle demonstrates regional wall motion abnormalities (see scoring diagram/findings for description). Left ventricular diastolic parameters were normal.  2. Right ventricular systolic function is normal. The right ventricular size is normal.  3. Left atrial size was mildly dilated.   4. The mitral valve is normal in structure. Mild mitral valve regurgitation.  5. The aortic valve is normal in structure. Aortic valve regurgitation is not visualized. FINDINGS  Left Ventricle: Left ventricular ejection fraction, by estimation, is 45 to 50%. The left ventricle has mildly decreased function. The left ventricle demonstrates regional wall motion abnormalities. Mild hypokinesis of the left ventricular, apical apical segment. The left ventricular internal cavity size was normal in size. There is no left ventricular hypertrophy. Left ventricular diastolic parameters were normal. Right Ventricle: The right ventricular size is normal. No increase in right ventricular wall thickness.  Right ventricular systolic function is normal. Left Atrium: Left atrial size was mildly dilated. Right Atrium: Right atrial size was normal in size. Pericardium: There is no evidence of pericardial effusion. Mitral Valve: The mitral valve is normal in structure. Mild mitral valve regurgitation. MV peak gradient, 1.2 mmHg. The mean mitral valve gradient is 0.0 mmHg. Tricuspid Valve: The tricuspid valve is normal in structure. Tricuspid valve regurgitation is mild. Aortic Valve: The aortic valve is normal in structure. Aortic valve regurgitation is not visualized. Aortic valve mean gradient measures 3.0 mmHg. Aortic valve peak gradient measures 4.2 mmHg. Aortic valve area, by VTI measures 2.45 cm. Pulmonic Valve: The pulmonic valve was normal in structure. Pulmonic valve regurgitation is trivial. Aorta: The aortic root and ascending aorta are structurally normal, with no evidence of dilitation. IAS/Shunts: No atrial level shunt detected by color flow Doppler.  LEFT VENTRICLE PLAX 2D LVIDd:         4.96 cm  Diastology LVIDs:         3.26 cm  LV e' lateral:   6.09 cm/s LV PW:         0.97 cm  LV E/e' lateral: 7.8 LV IVS:        0.89 cm LVOT diam:     2.10 cm LV SV:         49 LV SV Index:   24 LVOT Area:     3.46 cm  RIGHT  VENTRICLE RV Basal diam:  3.48 cm LEFT ATRIUM             Index       RIGHT ATRIUM           Index LA diam:        4.20 cm 2.04 cm/m  RA Area:     14.70 cm LA Vol (A2C):   66.4 ml 32.18 ml/m RA Volume:   32.80 ml  15.90 ml/m LA Vol (A4C):   43.8 ml 21.23 ml/m LA Biplane Vol: 54.7 ml 26.51 ml/m  AORTIC VALVE                   PULMONIC VALVE AV Area (Vmax):    2.41 cm    PV Vmax:       0.89 m/s AV Area (Vmean):   2.04 cm    PV Vmean:      59.200 cm/s AV Area (VTI):     2.45 cm    PV VTI:        0.163 m AV Vmax:           103.00 cm/s PV Peak grad:  3.2 mmHg AV Vmean:          76.900 cm/s PV Mean grad:  2.0 mmHg AV VTI:            0.201 m AV Peak Grad:      4.2 mmHg AV Mean Grad:      3.0 mmHg LVOT Vmax:         71.80 cm/s LVOT Vmean:        45.200 cm/s LVOT VTI:          0.142 m LVOT/AV VTI ratio: 0.71  AORTA Ao Root diam: 3.50 cm MITRAL VALVE MV Area (PHT): 2.59 cm    SHUNTS MV Peak grad:  1.2 mmHg    Systemic VTI:  0.14 m MV Mean grad:  0.0 mmHg    Systemic Diam: 2.10 cm MV Vmax:       0.55 m/s  MV Vmean:      33.6 cm/s MV Decel Time: 293 msec MV E velocity: 47.60 cm/s MV A velocity: 39.80 cm/s MV E/A ratio:  1.20 Serafina Royals MD Electronically signed by Serafina Royals MD Signature Date/Time: 10/16/2020/2:47:53 PM    Final      Assessment and Recommendation  86 y.o. male with no significant amount dementia with that is worsening recently having weakness fatigue and shortness of breath with bibasilar changes on chest x-ray and what appears to be acute on chronic combined systolic diastolic dysfunction congestive heart failure multifactorial in nature including chronic kidney disease stage III worsening at this time with possible significant overdiuresis without evidence of myocardial infarction or acute coronary syndrome without ability to truly assess significant improvements due to patient's inability to converse well. 1.  Abstain from diuretics at this time due to worsening kidney dysfunction and  possible kidney injury with chest x-ray showing no evidence of pulmonary edema.  Would consider the possibility of reinstatement as an outpatient as necessary 2.  Continue atenolol for heart rate control and blood pressure control despite some bradycardia which appears to be asymptomatic.  Would continue to follow very closely for any other significant bradycardia since atenolol is metabolized by the kidneys 3.   Okay for continuation hydralazine isosorbide for LV systolic dysfunction and hypertension control but will abstain from lisinopril temporarily due to concerns of acute kidney injury 4.  No further intervention of minimal elevation of troponin more consistent with demand ischemia 5.  Continuation of supportive care dementia and other significant concerns 6.  With discussion of family it appears that the next potential step is placement for the next few weeks for recovery from above 7.  Please call if further questions otherwise will sign off at this time due to no evidence of changes and need for intervention.  This is with recommendations listed above.  Signed, Serafina Royals M.D. FACC

## 2020-10-22 NOTE — Progress Notes (Signed)
Per active delirium precautions this RN spoke with Lab to delay lab draw while pt is sleeping.  Advised to draw labs at 8am today.  Will notify MD and oncoming shift RN.  This RN will continue to monitor.

## 2020-10-22 NOTE — Care Management Important Message (Signed)
Important Message  Patient Details  Name: Louis Harrison MRN: [id number] Date of Birth: April 07, 1928   Medicare Important Message Given:  Yes     Dannette Barbara 10/22/2020, 12:46 PM

## 2020-10-22 NOTE — Progress Notes (Signed)
Pt agitated and confused.  Unable to follow commands or be redirected.  Attempting to hit staff, get out of bed, unsteady gait.  PRN medication for agitation given.  Son at bedside.  RN will continue to monitor.

## 2020-10-22 NOTE — Progress Notes (Signed)
PROGRESS NOTE    Louis Harrison  LKG:[id number] DOB: Jul 30, 1928 DOA: 10/15/2020 PCP: [facility] [place]   Brief Narrative: Louis Harrison is a 86 y.o. male with a history of COPD, chronic respiratory failure on oxygen, CHF, hypertension. Patient presented secondary to altered mental status and found to have an acute heart failure exacerbation. IV diuresis initiated.   Assessment & Plan:   Principal Problem:   Dementia ([place]) Active Problems:   Chronic respiratory failure (HCC)   COPD (chronic obstructive pulmonary disease) (HCC)   Hypertension   Coronary artery disease   Acute CHF (congestive heart failure) (HCC)   CKD (chronic kidney disease), stage IV (HCC)   Hypothyroidism   Acute on chronic diastolic heart failure Pulmonary edema on x-ray. Patient started on Lasix IV with good diuresis and transitioned to torsemide 40 mg daily. Lasix diuresis discontinued secondary to worsening renal function. Weight down 5.5 kg from admission. Net IO Since Admission: -4,060.8 mL [10/22/20 1212]. Transthoracic Echocardiogram significant for an EF of 45-50% -Watch fluid status carefully with cessation of diuretic and initiation of IV fluids for AKI  AKI on CKD stage IIIa Nephrology consulted. AKI in setting of IV diuresis. Nephrology consulted. AKI improving. IV access removed by patient. -Nephrology recommendations  Acute on chronic respiratory failure On 2L/min of oxygen as an outpatient. Required as much as 4 L/min on admission which was quickly weaned down to home 2 L. -Continue 2 l/min of oxygen  COPD exacerbation Patient managed on systemic steroids and nebulizer treatments in addition to Azithromycin which is now completed. Now resolved. -Continue albuterol prn  Elevated troponin In setting of heart failure. Peak high sensitivity troponin of 93.  Primary hypertension -Continue Imdur, atenolol  Hypothyroidism -Continue Synthroid 125 mcg daily  Tophaceous  gout Patient is on indomethacin as an outpatient. Currently stable.  BPH -Continue Flomax  GERD -Continue Protonix  Dementia Still with significant behavioral issues. Has received haldol intermittently which causes a significant amount of sedation and may need to be avoided -Continue Seroquel; may need to increase dose depending on patient response -address patient's needs as best as possible  Acute urinary retention Unknown etiology. Urinalysis is unremarkable for infection. -Insert foley for treatment and comfort   DVT prophylaxis: Lovenox Code Status:   Code Status: DNR Family Communication: None at bedside. Son on telephone (15 minutes) Disposition Plan: Discharge likely to SNF in several days pending improvement of AKI and behavior vs goals of care discussions and possible end of life care   Consultants:   Cardiology  Nephrology  Palliative care medicine  Procedures:   TRANSTHORACIC ECHOCARDIOGRAM (10/16/2020) IMPRESSIONS    1. Left ventricular ejection fraction, by estimation, is 45 to 50%. The  left ventricle has mildly decreased function. The left ventricle  demonstrates regional wall motion abnormalities (see scoring  diagram/findings for description). Left ventricular  diastolic parameters were normal.  2. Right ventricular systolic function is normal. The right ventricular  size is normal.  3. Left atrial size was mildly dilated.  4. The mitral valve is normal in structure. Mild mitral valve  regurgitation.  5. The aortic valve is normal in structure. Aortic valve regurgitation is  not visualized.  Antimicrobials:  Azithromycin    Subjective: Lethargic today  Objective: Vitals:   10/21/20 1615 10/21/20 2010 10/22/20 0353 10/22/20 0831  BP: (!) 101/59 124/64  132/75  Pulse: (!) 55 64  (!) 58  Resp:    16  Temp: 98.2 F (36.8 C) 98.1 F (  36.7 C)  98.6 F (37 C)  TempSrc: Oral Axillary  Axillary  SpO2: 96% 92%  94%  Weight:   80.7  kg   Height:        Intake/Output Summary (Last 24 hours) at 10/22/2020 1212 Last data filed at 10/22/2020 0945 Gross per 24 hour  Intake 0 ml  Output 600 ml  Net -600 ml   Filed Weights   10/20/20 0540 10/21/20 0013 10/22/20 0353  Weight: 78 kg 79.8 kg 80.7 kg    Examination:  General exam: Appears calm and comfortable Respiratory system: Clear to auscultation. Respiratory effort normal. Cardiovascular system: S1 & S2 heard, RRR. No murmurs, rubs, gallops or clicks. Gastrointestinal system: Abdomen has some suprapubic tenderness but is otherwise soft. No organomegaly or masses felt. Normal bowel sounds heard. Central nervous system: Lethargic but arouses slightly and briefly to stimulation.  Musculoskeletal: No edema. No calf tenderness Skin: No cyanosis. No rashes   Data Reviewed: I have personally reviewed following labs and imaging studies  CBC Lab Results  Component Value Date   WBC 8.4 10/21/2020   RBC 4.31 10/21/2020   HGB 14.6 10/21/2020   HCT 45.0 10/21/2020   MCV 104.4 (H) 10/21/2020   MCH 33.9 10/21/2020   PLT 110 (L) 10/21/2020   MCHC 32.4 10/21/2020   RDW 13.8 10/21/2020   LYMPHSABS 0.8 10/15/2020   MONOABS 0.7 10/15/2020   EOSABS 0.1 10/15/2020   BASOSABS 0.1 18/56/3149     Last metabolic panel Lab Results  Component Value Date   NA 139 10/22/2020   K 4.3 10/22/2020   CL 99 10/22/2020   CO2 28 10/22/2020   BUN 60 (H) 10/22/2020   CREATININE 2.51 (H) 10/22/2020   GLUCOSE 105 (H) 10/22/2020   GFRNONAA 24 (L) 10/22/2020   GFRAA 54 (L) 09/17/2016   CALCIUM 8.6 (L) 10/22/2020   PROT 6.0 (L) 10/20/2020   ALBUMIN 3.3 (L) 10/20/2020   BILITOT 1.6 (H) 10/20/2020   ALKPHOS 60 10/20/2020   AST 28 10/20/2020   ALT 18 10/20/2020   ANIONGAP 12 10/22/2020    CBG (last 3)  No results for input(s): GLUCAP in the last 72 hours.   GFR: Estimated Creatinine Clearance: 20.8 mL/min (A) (by C-G formula based on SCr of 2.51 mg/dL (H)).  Coagulation  Profile: No results for input(s): INR, PROTIME in the last 168 hours.  Recent Results (from the past 240 hour(s))  Resp Panel by RT-PCR (Flu A&B, Covid) Nasopharyngeal Swab     Status: None   Collection Time: 10/15/20 11:38 AM   Specimen: Nasopharyngeal Swab; Nasopharyngeal(NP) swabs in vial transport medium  Result Value Ref Range Status   SARS Coronavirus 2 by RT PCR NEGATIVE NEGATIVE Final    Comment: (NOTE) SARS-CoV-2 target nucleic acids are NOT DETECTED.  The SARS-CoV-2 RNA is generally detectable in upper respiratory specimens during the acute phase of infection. The lowest concentration of SARS-CoV-2 viral copies this assay can detect is 138 copies/mL. A negative result does not preclude SARS-Cov-2 infection and should not be used as the sole basis for treatment or other patient management decisions. A negative result may occur with  improper specimen collection/handling, submission of specimen other than nasopharyngeal swab, presence of viral mutation(s) within the areas targeted by this assay, and inadequate number of viral copies(<138 copies/mL). A negative result must be combined with clinical observations, patient history, and epidemiological information. The expected result is Negative.  Fact Sheet for Patients:  EntrepreneurPulse.com.au  Fact Sheet for Healthcare Providers:  IncredibleEmployment.be  This test is no t yet approved or cleared by the [place] and  has been authorized for detection and/or diagnosis of SARS-CoV-2 by FDA under an Emergency Use Authorization (EUA). This EUA will remain  in effect (meaning this test can be used) for the duration of the COVID-19 declaration under Section 564(b)(1) of the Act, 21 U.S.C.section 360bbb-3(b)(1), unless the authorization is terminated  or revoked sooner.       Influenza A by PCR NEGATIVE NEGATIVE Final   Influenza B by PCR NEGATIVE NEGATIVE Final    Comment:  (NOTE) The Xpert Xpress SARS-CoV-2/FLU/RSV plus assay is intended as an aid in the diagnosis of influenza from Nasopharyngeal swab specimens and should not be used as a sole basis for treatment. Nasal washings and aspirates are unacceptable for Xpert Xpress SARS-CoV-2/FLU/RSV testing.  Fact Sheet for Patients: EntrepreneurPulse.com.au  Fact Sheet for Healthcare Providers: IncredibleEmployment.be  This test is not yet approved or cleared by the [place] FDA and has been authorized for detection and/or diagnosis of SARS-CoV-2 by FDA under an Emergency Use Authorization (EUA). This EUA will remain in effect (meaning this test can be used) for the duration of the COVID-19 declaration under Section 564(b)(1) of the Act, 21 U.S.C. section 360bbb-3(b)(1), unless the authorization is terminated or revoked.  Performed at [facility], [address], [place], [place] [address]         Radiology Studies: CT HEAD WO CONTRAST  Result Date: 10/20/2020 CLINICAL DATA:  Mental status change. EXAM: CT HEAD WITHOUT CONTRAST TECHNIQUE: Contiguous axial images were obtained from the base of the skull through the vertex without intravenous contrast. COMPARISON:  CT head 10/15/2020 FINDINGS: Brain: Cerebral ventricle sizes are concordant with the degree of cerebral volume loss. Patchy and confluent areas of decreased attenuation are noted throughout the deep and periventricular white matter of the cerebral hemispheres bilaterally, compatible with chronic microvascular ischemic disease. No evidence of large-territorial acute infarction. No parenchymal hemorrhage. No mass lesion. No extra-axial collection. No mass effect or midline shift. No hydrocephalus. Basilar cisterns are patent. Vascular: No hyperdense vessel. Skull: No acute fracture or focal lesion. Sinuses/Orbits: Paranasal sinuses and mastoid air cells are clear. The orbits are unremarkable. Other:  None. IMPRESSION: No acute intracranial abnormality. Electronically Signed   By: Iven Finn M.D.   On: 10/20/2020 19:13   DG Knee Right Port  Result Date: 10/21/2020 CLINICAL DATA:  Generalized RIGHT knee pain. EXAM: PORTABLE RIGHT KNEE - 1-2 VIEW COMPARISON:  Plain film of the RIGHT knee dated 03/01/2016. FINDINGS: Osseous alignment is normal. Bone mineralization is grossly normal. No fracture line or displaced fracture fragment. No acute or suspicious osseous lesion. Mild tricompartmental joint space narrowings. No large osteophytes or other signs of advanced degenerative joint disease Chondrocalcinosis of better appreciated within the joint space on the previous exam. Faint calcific densities are again seen along the course of the quadriceps insertion site, again likely related to the supposed underlying CPPD arthropathy. Probable small joint effusion which is likely chronic. Vascular calcifications of the distal SFA. IMPRESSION: 1. No acute findings. 2. Mild tricompartmental degenerative change. 3. Chondrocalcinosis of the knee joint space, compatible with underlying CPPD arthropathy. 4. Probable small joint effusion which is likely chronic. 5. Vascular calcifications. Electronically Signed   By: Franki Cabot M.D.   On: 10/21/2020 08:16        Scheduled Meds: . aspirin EC  81 mg Oral Daily  . atenolol  25 mg Oral Daily  .  dextromethorphan-guaiFENesin  1 tablet Oral BID  . enoxaparin (LOVENOX) injection  30 mg Subcutaneous Q24H  . isosorbide mononitrate  30 mg Oral Daily  . levothyroxine  125 mcg Oral Q0600  . pantoprazole  40 mg Oral Daily  . QUEtiapine  25 mg Oral Daily  . QUEtiapine  50 mg Oral QHS  . simvastatin  10 mg Oral Daily  . sodium chloride flush  3 mL Intravenous Q12H  . tamsulosin  0.4 mg Oral QPC breakfast   Continuous Infusions: . sodium chloride    . sodium chloride Stopped (10/21/20 1738)     LOS: 7 days     Cordelia Poche, MD [facility] 10/22/2020, 12:12 PM  If 7PM-7AM, please contact night-coverage [email or web address]

## 2020-10-22 NOTE — Progress Notes (Signed)
Consulted to restart I.V. placed  12/29@ 19:00. Patient too combative at this time to attempt a successful restart. RN to notify M.D.

## 2020-10-22 NOTE — Progress Notes (Signed)
Patient's son (contact) updated on room change.

## 2020-10-22 NOTE — Progress Notes (Signed)
[facility]  ROUNDING NOTE   Subjective:  Patient resting comfortably in bed at the moment. Renal function has improved. Creatinine down to 2.5.   Objective:  Vital signs in last 24 hours:  Temp:  [98.1 F (36.7 C)-98.9 F (37.2 C)] 98.9 F (37.2 C) (12/30 1219) Pulse Rate:  [55-64] 61 (12/30 1219) Resp:  [16-18] 18 (12/30 1219) BP: (101-141)/(59-95) 141/95 (12/30 1219) SpO2:  [92 %-96 %] 96 % (12/30 1219) Weight:  [80.7 kg] 80.7 kg (12/30 0353)  Weight change: 0.9 kg Filed Weights   10/20/20 0540 10/21/20 0013 10/22/20 0353  Weight: 78 kg 79.8 kg 80.7 kg    Intake/Output: I/O last 3 completed shifts: In: 897.8 [P.O.:240; I.V.:657.8] Out: 1200 [Urine:1200]   Intake/Output this shift:  No intake/output data recorded.  Physical Exam: General:  No acute distress  Head:  Normocephalic, atraumatic. Moist oral mucosal membranes  Eyes:  Anicteric  Neck:  Supple  Lungs:   Clear to auscultation, normal effort  Heart:  S1S2 no rubs  Abdomen:   Soft, nontender, bowel sounds present  Extremities:  Trace peripheral edema.  Signs of gouty arthropathy in both hands  Neurologic:  Awake, alert, following commands  Skin:  No acute rash  Access:  No hemodialysis access    Basic Metabolic Panel: Recent Labs  Lab 10/18/20 0854 10/19/20 0415 10/20/20 0604 10/21/20 0740 10/22/20 0915  NA 140 141 140 136 139  K 4.3 4.4 4.8 4.2 4.3  CL 97* 98 98 98 99  CO2 32 28 30 24 28   GLUCOSE 93 100* 92 81 105*  BUN 32* 46* 64* 66* 60*  CREATININE 1.89* 2.70* 3.20* 3.15* 2.51*  CALCIUM 8.7* 8.7* 8.5* 8.0* 8.6*  MG 2.0  --   --   --   --     Liver Function Tests: Recent Labs  Lab 10/20/20 2116  AST 28  ALT 18  ALKPHOS 60  BILITOT 1.6*  PROT 6.0*  ALBUMIN 3.3*   No results for input(s): LIPASE, AMYLASE in the last 168 hours. Recent Labs  Lab 10/20/20 2116  AMMONIA 24    CBC: Recent Labs  Lab 10/17/20 0713 10/18/20 0854 10/19/20 0415 10/20/20 0604  10/21/20 0503  WBC 9.5 10.9* 10.0 11.7* 8.4  HGB 17.7* 17.4* 18.1* 17.1* 14.6  HCT 53.7* 53.5* 54.4* 51.5 45.0  MCV 102.5* 103.1* 102.6* 102.8* 104.4*  PLT 125* 130* 132* 125* 110*    Cardiac Enzymes: No results for input(s): CKTOTAL, CKMB, CKMBINDEX, TROPONINI in the last 168 hours.  BNP: Invalid input(s): POCBNP  CBG: No results for input(s): GLUCAP in the last 168 hours.  Microbiology: Results for orders placed or performed during the hospital encounter of 10/15/20  Resp Panel by RT-PCR (Flu A&B, Covid) Nasopharyngeal Swab     Status: None   Collection Time: 10/15/20 11:38 AM   Specimen: Nasopharyngeal Swab; Nasopharyngeal(NP) swabs in vial transport medium  Result Value Ref Range Status   SARS Coronavirus 2 by RT PCR NEGATIVE NEGATIVE Final    Comment: (NOTE) SARS-CoV-2 target nucleic acids are NOT DETECTED.  The SARS-CoV-2 RNA is generally detectable in upper respiratory specimens during the acute phase of infection. The lowest concentration of SARS-CoV-2 viral copies this assay can detect is 138 copies/mL. A negative result does not preclude SARS-Cov-2 infection and should not be used as the sole basis for treatment or other patient management decisions. A negative result may occur with  improper specimen collection/handling, submission of specimen other than nasopharyngeal swab, presence  of viral mutation(s) within the areas targeted by this assay, and inadequate number of viral copies(<138 copies/mL). A negative result must be combined with clinical observations, patient history, and epidemiological information. The expected result is Negative.  Fact Sheet for Patients:  EntrepreneurPulse.com.au  Fact Sheet for Healthcare Providers:  IncredibleEmployment.be  This test is no t yet approved or cleared by the [place] FDA and  has been authorized for detection and/or diagnosis of SARS-CoV-2 by FDA under an Emergency Use  Authorization (EUA). This EUA will remain  in effect (meaning this test can be used) for the duration of the COVID-19 declaration under Section 564(b)(1) of the Act, 21 U.S.C.section 360bbb-3(b)(1), unless the authorization is terminated  or revoked sooner.       Influenza A by PCR NEGATIVE NEGATIVE Final   Influenza B by PCR NEGATIVE NEGATIVE Final    Comment: (NOTE) The Xpert Xpress SARS-CoV-2/FLU/RSV plus assay is intended as an aid in the diagnosis of influenza from Nasopharyngeal swab specimens and should not be used as a sole basis for treatment. Nasal washings and aspirates are unacceptable for Xpert Xpress SARS-CoV-2/FLU/RSV testing.  Fact Sheet for Patients: EntrepreneurPulse.com.au  Fact Sheet for Healthcare Providers: IncredibleEmployment.be  This test is not yet approved or cleared by the [place] FDA and has been authorized for detection and/or diagnosis of SARS-CoV-2 by FDA under an Emergency Use Authorization (EUA). This EUA will remain in effect (meaning this test can be used) for the duration of the COVID-19 declaration under Section 564(b)(1) of the Act, 21 U.S.C. section 360bbb-3(b)(1), unless the authorization is terminated or revoked.  Performed at [facility], [address] [place], [place] [address]     Coagulation Studies: No results for input(s): LABPROT, INR in the last 72 hours.  Urinalysis: Recent Labs    10/21/20 1520  COLORURINE YELLOW*  LABSPEC 1.014  PHURINE 5.0  GLUCOSEU NEGATIVE  HGBUR NEGATIVE  BILIRUBINUR NEGATIVE  KETONESUR NEGATIVE  PROTEINUR NEGATIVE  NITRITE NEGATIVE  LEUKOCYTESUR NEGATIVE      Imaging: CT HEAD WO CONTRAST  Result Date: 10/20/2020 CLINICAL DATA:  Mental status change. EXAM: CT HEAD WITHOUT CONTRAST TECHNIQUE: Contiguous axial images were obtained from the base of the skull through the vertex without intravenous contrast. COMPARISON:  CT head  10/15/2020 FINDINGS: Brain: Cerebral ventricle sizes are concordant with the degree of cerebral volume loss. Patchy and confluent areas of decreased attenuation are noted throughout the deep and periventricular white matter of the cerebral hemispheres bilaterally, compatible with chronic microvascular ischemic disease. No evidence of large-territorial acute infarction. No parenchymal hemorrhage. No mass lesion. No extra-axial collection. No mass effect or midline shift. No hydrocephalus. Basilar cisterns are patent. Vascular: No hyperdense vessel. Skull: No acute fracture or focal lesion. Sinuses/Orbits: Paranasal sinuses and mastoid air cells are clear. The orbits are unremarkable. Other: None. IMPRESSION: No acute intracranial abnormality. Electronically Signed   By: Iven Finn M.D.   On: 10/20/2020 19:13   DG Knee Right Port  Result Date: 10/21/2020 CLINICAL DATA:  Generalized RIGHT knee pain. EXAM: PORTABLE RIGHT KNEE - 1-2 VIEW COMPARISON:  Plain film of the RIGHT knee dated 03/01/2016. FINDINGS: Osseous alignment is normal. Bone mineralization is grossly normal. No fracture line or displaced fracture fragment. No acute or suspicious osseous lesion. Mild tricompartmental joint space narrowings. No large osteophytes or other signs of advanced degenerative joint disease Chondrocalcinosis of better appreciated within the joint space on the previous exam. Faint calcific densities are again seen along the course of  the quadriceps insertion site, again likely related to the supposed underlying CPPD arthropathy. Probable small joint effusion which is likely chronic. Vascular calcifications of the distal SFA. IMPRESSION: 1. No acute findings. 2. Mild tricompartmental degenerative change. 3. Chondrocalcinosis of the knee joint space, compatible with underlying CPPD arthropathy. 4. Probable small joint effusion which is likely chronic. 5. Vascular calcifications. Electronically Signed   By: Franki Cabot M.D.    On: 10/21/2020 08:16     Medications:   . sodium chloride    . sodium chloride Stopped (10/21/20 1738)   . aspirin EC  81 mg Oral Daily  . atenolol  25 mg Oral Daily  . dextromethorphan-guaiFENesin  1 tablet Oral BID  . enoxaparin (LOVENOX) injection  30 mg Subcutaneous Q24H  . isosorbide mononitrate  30 mg Oral Daily  . levothyroxine  125 mcg Oral Q0600  . pantoprazole  40 mg Oral Daily  . QUEtiapine  25 mg Oral Daily  . QUEtiapine  50 mg Oral QHS  . simvastatin  10 mg Oral Daily  . sodium chloride flush  3 mL Intravenous Q12H  . tamsulosin  0.4 mg Oral QPC breakfast   sodium chloride, acetaminophen, albuterol, haloperidol lactate, hydrALAZINE, ondansetron (ZOFRAN) IV, sodium chloride flush  Assessment/ Plan:  86 y.o. male  with a PMHx of dementia, COPD, chronic respiratory failure, chronic diastolic heart failure, hypertension, chronic kidney disease stage IIIa, gout, laryngeal cancer, renal artery stenosis who was admitted to [facility] on 10/15/2020 for evaluation of altered mental status.   1.  Acute kidney injury/chronic kidney disease stage IIIa.  Initially it was felt that the patient had some element of intravascular volume depletion.  Patient was started on IV fluid hydration.  However it appears that patient pulled out IV.  Renal function did improve.  Continue to monitor renal parameters daily for now.     LOS: 7 Louis Harrison 12/30/20214:07 PM

## 2020-10-22 NOTE — Progress Notes (Signed)
Pt agitated throughout shift displaying unsafe behaviors.  PRN medication given x2 during shift, with little to no change.  MD contacted, new order placed.  Medication given.  RN will continue to monitor.  Justice Britain, RN 10/22/2020 4:28 AM

## 2020-10-22 NOTE — Plan of Care (Signed)
°  Problem: Clinical Measurements: Goal: Ability to maintain clinical measurements within normal limits will improve Outcome: Progressing Goal: Will remain free from infection Outcome: Progressing Goal: Respiratory complications will improve Outcome: Progressing Goal: Cardiovascular complication will be avoided Outcome: Progressing   Problem: Elimination: Goal: Will not experience complications related to urinary retention Outcome: Progressing   Problem: Education: Goal: Knowledge of General Education information will improve Description: Including pain rating scale, medication(s)/side effects and non-pharmacologic comfort measures Outcome: Not Progressing   Problem: Health Behavior/Discharge Planning: Goal: Ability to manage health-related needs will improve Outcome: Not Progressing   Problem: Activity: Goal: Risk for activity intolerance will decrease Outcome: Not Progressing   Problem: Coping: Goal: Level of anxiety will decrease Outcome: Not Progressing   Problem: Skin Integrity: Goal: Risk for impaired skin integrity will decrease Outcome: Not Progressing

## 2020-10-22 NOTE — Progress Notes (Signed)
Patient ID: JAMISEN DUERSON, male   DOB: 04-Oct-1928, 86 y.o.   MRN: [id number]  This NP visited patient at the bedside as a follow up for palliaitve medcicne needs and emotional support.    Patient remains lethargic, poor po intake.  Intermittently confused  I returned a call to son/HPOA for ongoing support and assessment for palliative medicine needs and emotional support.  Son verbalized an understanding of the seriousness of Mr. Simmon's current medical situation,  however at this time they remain hopeful for improvement and open to continued medical interventions to treat the treatable and transition to skilled nursing facility for rehab in hopes of return to baseline  Education offered on concepts regarding adult failure to thrive and the limitations of medical interventions to prolong quality of life when a body fails to thrive.   Mr Broughton is high risk for decompensation.  Discussed with son the importance of continued conversation with his family and their  medical providers regarding overall plan of care and treatment options,  ensuring decisions are within the context of the patients values and GOCs.  Questions and concerns addressed     Recommend outpatient community-based palliative services at skilled nursing facility.    Family encouraged to call palliative medicine with questions or concerns  Total time spent on the unit was 25 minutes  Greater than 50% of the time was spent in counseling and coordination of care  Wadie Lessen NP  Palliative Medicine Team Team Phone # [phone] Pager [phone]

## 2020-10-23 DIAGNOSIS — G309 Alzheimer's disease, unspecified: Secondary | ICD-10-CM | POA: Diagnosis not present

## 2020-10-23 DIAGNOSIS — E039 Hypothyroidism, unspecified: Secondary | ICD-10-CM | POA: Diagnosis not present

## 2020-10-23 DIAGNOSIS — N179 Acute kidney failure, unspecified: Secondary | ICD-10-CM | POA: Diagnosis not present

## 2020-10-23 DIAGNOSIS — I5031 Acute diastolic (congestive) heart failure: Secondary | ICD-10-CM | POA: Diagnosis not present

## 2020-10-23 LAB — BASIC METABOLIC PANEL
Anion gap: 9 (ref 5–15)
BUN: 48 mg/dL — ABNORMAL HIGH (ref 8–23)
CO2: 30 mmol/L (ref 22–32)
Calcium: 8.6 mg/dL — ABNORMAL LOW (ref 8.9–10.3)
Chloride: 103 mmol/L (ref 98–111)
Creatinine, Ser: 2.16 mg/dL — ABNORMAL HIGH (ref 0.61–1.24)
GFR, Estimated: 28 mL/min — ABNORMAL LOW (ref >60)
Glucose, Bld: 91 mg/dL (ref 70–99)
Potassium: 4.7 mmol/L (ref 3.5–5.1)
Sodium: 142 mmol/L (ref 135–145)

## 2020-10-23 MED ORDER — LORAZEPAM 2 MG/ML IJ SOLN: 1.0000 mg | Freq: Four times a day (QID) | INTRAMUSCULAR | Status: DC | PRN

## 2020-10-23 MED ORDER — LORAZEPAM 2 MG/ML PO CONC
1.0000 mg | Freq: Four times a day (QID) | ORAL | Status: DC | PRN
  Administered 2020-10-23 – 2020-10-24 (×2): 1 mg via ORAL
  Filled 2020-10-23 (×4): qty 0.5

## 2020-10-23 NOTE — Progress Notes (Signed)
PROGRESS NOTE    Louis Harrison  XBM:[id number] DOB: 06-Mar-1928 DOA: 10/15/2020 PCP: [facility]   Brief Narrative: Louis Harrison is a 86 y.o. male with a history of COPD, chronic respiratory failure on oxygen, CHF, hypertension. Patient presented secondary to altered mental status and found to have an acute heart failure exacerbation. IV diuresis initiated with initial improvement but development of AKI from overdiuresis.   Assessment & Plan:   Principal Problem:   Dementia ([place]) Active Problems:   Chronic respiratory failure (HCC)   COPD (chronic obstructive pulmonary disease) (HCC)   Hypertension   Coronary artery disease   Acute CHF (congestive heart failure) (HCC)   CKD (chronic kidney disease), stage IV (HCC)   Hypothyroidism   Acute on chronic diastolic heart failure Pulmonary edema on x-ray. Patient started on Lasix IV with good diuresis and transitioned to torsemide 40 mg daily. Lasix diuresis discontinued secondary to worsening renal function. Weight down 5.5 kg from admission. Net IO Since Admission: -5,640.8 mL [10/23/20 1336]. Transthoracic Echocardiogram significant for an EF of 45-50% -Watch fluid status carefully with cessation of diuretic and initiation of IV fluids for AKI  AKI on CKD stage IIIa Nephrology consulted. AKI in setting of IV diuresis. Nephrology consulted. AKI improving. IV access removed by patient. Kidney function continues to improve -Nephrology recommendations  Acute on chronic respiratory failure On 2L/min of oxygen as an outpatient. Required as much as 4 L/min on admission which was quickly weaned down to home 2 L. -Continue 2 l/min of oxygen  COPD exacerbation Patient managed on systemic steroids and nebulizer treatments in addition to Azithromycin which is now completed. Now resolved. -Continue albuterol prn  Elevated troponin In setting of heart failure. Peak high sensitivity troponin of 93.  Primary  hypertension -Continue Imdur, atenolol  Hypothyroidism -Continue Synthroid 125 mcg daily  Tophaceous gout Patient is on indomethacin as an outpatient. Currently stable.  BPH -Continue Flomax  GERD -Continue Protonix  Dementia Still with significant behavioral issues. Has received haldol intermittently which causes a significant amount of sedation and may need to be avoided -Continue Seroquel; may need to increase dose depending on patient response -address patient's needs as best as possible  Acute urinary retention Unknown etiology. Urinalysis is unremarkable for infection. Foley catheter placed on 12/30 -Urine culture   DVT prophylaxis: Lovenox Code Status:   Code Status: DNR Family Communication: None at bedside. Disposition Plan: Discharge likely to SNF in several days pending improvement of AKI and behavior vs goals of care discussions and possible end of life care   Consultants:   Cardiology  Nephrology  Palliative care medicine  Procedures:   TRANSTHORACIC ECHOCARDIOGRAM (10/16/2020) IMPRESSIONS    1. Left ventricular ejection fraction, by estimation, is 45 to 50%. The  left ventricle has mildly decreased function. The left ventricle  demonstrates regional wall motion abnormalities (see scoring  diagram/findings for description). Left ventricular  diastolic parameters were normal.  2. Right ventricular systolic function is normal. The right ventricular  size is normal.  3. Left atrial size was mildly dilated.  4. The mitral valve is normal in structure. Mild mitral valve  regurgitation.  5. The aortic valve is normal in structure. Aortic valve regurgitation is  not visualized.  Antimicrobials:  Azithromycin    Subjective: Says he's okay  Objective: Vitals:   10/22/20 2317 10/23/20 0543 10/23/20 0908 10/23/20 1113  BP: (!) 131/58 120/69 (!) 101/58 (!) 122/56  Pulse: (!) 56 (!) 53 (!) 55 Marland Kitchen)  58  Resp: 20 18 16  (!) 22  Temp: 98.2 F  (36.8 C) 98.1 F (36.7 C) 98.2 F (36.8 C) 97.7 F (36.5 C)  TempSrc: Oral Oral Oral Oral  SpO2: 97% 93% 95% 96%  Weight:      Height:        Intake/Output Summary (Last 24 hours) at 10/23/2020 1336 Last data filed at 10/23/2020 1003 Gross per 24 hour  Intake 120 ml  Output 1700 ml  Net -1580 ml   Filed Weights   10/20/20 0540 10/21/20 0013 10/22/20 0353  Weight: 78 kg 79.8 kg 80.7 kg    Examination:  General exam: Appears calm and comfortable Respiratory system: Rhonchi that are transmitted sounds. Respiratory effort normal. Cardiovascular system: S1 & S2 heard, RRR. No murmurs, rubs, gallops or clicks. Gastrointestinal system: Abdomen is nondistended, soft and nontender. No organomegaly or masses felt. Normal bowel sounds heard. Central nervous system: Lethargic but answers simple questions. Musculoskeletal: No edema. No calf tenderness Skin: No cyanosis. No rashes Psychiatry: Judgement and insight appear normal. Mood & affect appropriate.    Data Reviewed: I have personally reviewed following labs and imaging studies  CBC Lab Results  Component Value Date   WBC 8.4 10/21/2020   RBC 4.31 10/21/2020   HGB 14.6 10/21/2020   HCT 45.0 10/21/2020   MCV 104.4 (H) 10/21/2020   MCH 33.9 10/21/2020   PLT 110 (L) 10/21/2020   MCHC 32.4 10/21/2020   RDW 13.8 10/21/2020   LYMPHSABS 0.8 10/15/2020   MONOABS 0.7 10/15/2020   EOSABS 0.1 10/15/2020   BASOSABS 0.1 31/54/0086     Last metabolic panel Lab Results  Component Value Date   NA 142 10/23/2020   K 4.7 10/23/2020   CL 103 10/23/2020   CO2 30 10/23/2020   BUN 48 (H) 10/23/2020   CREATININE 2.16 (H) 10/23/2020   GLUCOSE 91 10/23/2020   GFRNONAA 28 (L) 10/23/2020   GFRAA 54 (L) 09/17/2016   CALCIUM 8.6 (L) 10/23/2020   PROT 6.0 (L) 10/20/2020   ALBUMIN 3.3 (L) 10/20/2020   BILITOT 1.6 (H) 10/20/2020   ALKPHOS 60 10/20/2020   AST 28 10/20/2020   ALT 18 10/20/2020   ANIONGAP 9 10/23/2020    CBG (last  3)  No results for input(s): GLUCAP in the last 72 hours.   GFR: Estimated Creatinine Clearance: 24.2 mL/min (A) (by C-G formula based on SCr of 2.16 mg/dL (H)).  Coagulation Profile: No results for input(s): INR, PROTIME in the last 168 hours.  Recent Results (from the past 240 hour(s))  Resp Panel by RT-PCR (Flu A&B, Covid) Nasopharyngeal Swab     Status: None   Collection Time: 10/15/20 11:38 AM   Specimen: Nasopharyngeal Swab; Nasopharyngeal(NP) swabs in vial transport medium  Result Value Ref Range Status   SARS Coronavirus 2 by RT PCR NEGATIVE NEGATIVE Final    Comment: (NOTE) SARS-CoV-2 target nucleic acids are NOT DETECTED.  The SARS-CoV-2 RNA is generally detectable in upper respiratory specimens during the acute phase of infection. The lowest concentration of SARS-CoV-2 viral copies this assay can detect is 138 copies/mL. A negative result does not preclude SARS-Cov-2 infection and should not be used as the sole basis for treatment or other patient management decisions. A negative result may occur with  improper specimen collection/handling, submission of specimen other than nasopharyngeal swab, presence of viral mutation(s) within the areas targeted by this assay, and inadequate number of viral copies(<138 copies/mL). A negative result must be combined with clinical observations,  patient history, and epidemiological information. The expected result is Negative.  Fact Sheet for Patients:  EntrepreneurPulse.com.au  Fact Sheet for Healthcare Providers:  IncredibleEmployment.be  This test is no t yet approved or cleared by the [place] FDA and  has been authorized for detection and/or diagnosis of SARS-CoV-2 by FDA under an Emergency Use Authorization (EUA). This EUA will remain  in effect (meaning this test can be used) for the duration of the COVID-19 declaration under Section 564(b)(1) of the Act, 21 U.S.C.section  360bbb-3(b)(1), unless the authorization is terminated  or revoked sooner.       Influenza A by PCR NEGATIVE NEGATIVE Final   Influenza B by PCR NEGATIVE NEGATIVE Final    Comment: (NOTE) The Xpert Xpress SARS-CoV-2/FLU/RSV plus assay is intended as an aid in the diagnosis of influenza from Nasopharyngeal swab specimens and should not be used as a sole basis for treatment. Nasal washings and aspirates are unacceptable for Xpert Xpress SARS-CoV-2/FLU/RSV testing.  Fact Sheet for Patients: EntrepreneurPulse.com.au  Fact Sheet for Healthcare Providers: IncredibleEmployment.be  This test is not yet approved or cleared by the [place] FDA and has been authorized for detection and/or diagnosis of SARS-CoV-2 by FDA under an Emergency Use Authorization (EUA). This EUA will remain in effect (meaning this test can be used) for the duration of the COVID-19 declaration under Section 564(b)(1) of the Act, 21 U.S.C. section 360bbb-3(b)(1), unless the authorization is terminated or revoked.  Performed at [facility], [address], [place], [place] [address]         Radiology Studies: No results found.      Scheduled Meds: . aspirin EC  81 mg Oral Daily  . atenolol  25 mg Oral Daily  . Chlorhexidine Gluconate Cloth  6 each Topical Daily  . dextromethorphan-guaiFENesin  1 tablet Oral BID  . enoxaparin (LOVENOX) injection  30 mg Subcutaneous Q24H  . isosorbide mononitrate  30 mg Oral Daily  . levothyroxine  125 mcg Oral Q0600  . pantoprazole  40 mg Oral Daily  . QUEtiapine  25 mg Oral Daily  . QUEtiapine  50 mg Oral QHS  . simvastatin  10 mg Oral Daily  . sodium chloride flush  3 mL Intravenous Q12H  . tamsulosin  0.4 mg Oral QPC breakfast   Continuous Infusions: . sodium chloride    . sodium chloride Stopped (10/21/20 1738)     LOS: 8 days     Cordelia Poche, MD [facility] 10/23/2020, 1:36 PM  If 7PM-7AM,  please contact night-coverage [email or web address]

## 2020-10-23 NOTE — Progress Notes (Signed)
PT Cancellation Note  Patient Details Name: Louis Harrison MRN: [id number] DOB: [birth year]   Cancelled Treatment:     PT attempt. PT hold. Per RN, pt has been combative when awake. Currently pt is resting soundly and per request, will let pt rest. Acute PT will continue to follow and progress as able per POC and pt tolerance.    Willette Pa 10/23/2020, 2:33 PM

## 2020-10-23 NOTE — Progress Notes (Signed)
[facility]  ROUNDING NOTE   Subjective:  Patient seen and evaluated at bedside. Son at the bedside. Creatinine down to 2.16. Urine output 1.7 L over the preceding 24 hours.   Objective:  Vital signs in last 24 hours:  Temp:  [97.7 F (36.5 C)-98.7 F (37.1 C)] 97.7 F (36.5 C) (12/31 1113) Pulse Rate:  [53-66] 58 (12/31 1113) Resp:  [16-22] 22 (12/31 1113) BP: (101-150)/(49-86) 122/56 (12/31 1113) SpO2:  [93 %-97 %] 96 % (12/31 1113)  Weight change:  Filed Weights   10/20/20 0540 10/21/20 0013 10/22/20 0353  Weight: 78 kg 79.8 kg 80.7 kg    Intake/Output: I/O last 3 completed shifts: In: 0  Out: 1700 [Urine:1700]   Intake/Output this shift:  Total I/O In: 480 [P.O.:480] Out: -   Physical Exam: General:  No acute distress  Head:  Normocephalic, atraumatic. Moist oral mucosal membranes  Eyes:  Anicteric  Neck:  Supple  Lungs:   Clear to auscultation, normal effort  Heart:  S1S2 no rubs  Abdomen:   Soft, nontender, bowel sounds present  Extremities:  Trace peripheral edema.  Signs of gouty arthropathy in both hands  Neurologic:  Awake, alert, following commands  Skin:  No acute rash  Access:  No hemodialysis access    Basic Metabolic Panel: Recent Labs  Lab 10/18/20 0854 10/19/20 0415 10/20/20 0604 10/21/20 0740 10/22/20 0915 10/23/20 0408  NA 140 141 140 136 139 142  K 4.3 4.4 4.8 4.2 4.3 4.7  CL 97* 98 98 98 99 103  CO2 32 28 30 24 28 30   GLUCOSE 93 100* 92 81 105* 91  BUN 32* 46* 64* 66* 60* 48*  CREATININE 1.89* 2.70* 3.20* 3.15* 2.51* 2.16*  CALCIUM 8.7* 8.7* 8.5* 8.0* 8.6* 8.6*  MG 2.0  --   --   --   --   --     Liver Function Tests: Recent Labs  Lab 10/20/20 2116  AST 28  ALT 18  ALKPHOS 60  BILITOT 1.6*  PROT 6.0*  ALBUMIN 3.3*   No results for input(s): LIPASE, AMYLASE in the last 168 hours. Recent Labs  Lab 10/20/20 2116  AMMONIA 24    CBC: Recent Labs  Lab 10/17/20 0713 10/18/20 0854 10/19/20 0415  10/20/20 0604 10/21/20 0503  WBC 9.5 10.9* 10.0 11.7* 8.4  HGB 17.7* 17.4* 18.1* 17.1* 14.6  HCT 53.7* 53.5* 54.4* 51.5 45.0  MCV 102.5* 103.1* 102.6* 102.8* 104.4*  PLT 125* 130* 132* 125* 110*    Cardiac Enzymes: No results for input(s): CKTOTAL, CKMB, CKMBINDEX, TROPONINI in the last 168 hours.  BNP: Invalid input(s): POCBNP  CBG: No results for input(s): GLUCAP in the last 168 hours.  Microbiology: Results for orders placed or performed during the hospital encounter of 10/15/20  Resp Panel by RT-PCR (Flu A&B, Covid) Nasopharyngeal Swab     Status: None   Collection Time: 10/15/20 11:38 AM   Specimen: Nasopharyngeal Swab; Nasopharyngeal(NP) swabs in vial transport medium  Result Value Ref Range Status   SARS Coronavirus 2 by RT PCR NEGATIVE NEGATIVE Final    Comment: (NOTE) SARS-CoV-2 target nucleic acids are NOT DETECTED.  The SARS-CoV-2 RNA is generally detectable in upper respiratory specimens during the acute phase of infection. The lowest concentration of SARS-CoV-2 viral copies this assay can detect is 138 copies/mL. A negative result does not preclude SARS-Cov-2 infection and should not be used as the sole basis for treatment or other patient management decisions. A negative result may  occur with  improper specimen collection/handling, submission of specimen other than nasopharyngeal swab, presence of viral mutation(s) within the areas targeted by this assay, and inadequate number of viral copies(<138 copies/mL). A negative result must be combined with clinical observations, patient history, and epidemiological information. The expected result is Negative.  Fact Sheet for Patients:  EntrepreneurPulse.com.au  Fact Sheet for Healthcare Providers:  IncredibleEmployment.be  This test is no t yet approved or cleared by the [place] FDA and  has been authorized for detection and/or diagnosis of SARS-CoV-2 by FDA under an  Emergency Use Authorization (EUA). This EUA will remain  in effect (meaning this test can be used) for the duration of the COVID-19 declaration under Section 564(b)(1) of the Act, 21 U.S.C.section 360bbb-3(b)(1), unless the authorization is terminated  or revoked sooner.       Influenza A by PCR NEGATIVE NEGATIVE Final   Influenza B by PCR NEGATIVE NEGATIVE Final    Comment: (NOTE) The Xpert Xpress SARS-CoV-2/FLU/RSV plus assay is intended as an aid in the diagnosis of influenza from Nasopharyngeal swab specimens and should not be used as a sole basis for treatment. Nasal washings and aspirates are unacceptable for Xpert Xpress SARS-CoV-2/FLU/RSV testing.  Fact Sheet for Patients: EntrepreneurPulse.com.au  Fact Sheet for Healthcare Providers: IncredibleEmployment.be  This test is not yet approved or cleared by the [place] FDA and has been authorized for detection and/or diagnosis of SARS-CoV-2 by FDA under an Emergency Use Authorization (EUA). This EUA will remain in effect (meaning this test can be used) for the duration of the COVID-19 declaration under Section 564(b)(1) of the Act, 21 U.S.C. section 360bbb-3(b)(1), unless the authorization is terminated or revoked.  Performed at [facility], [address] [place], [place] [address]     Coagulation Studies: No results for input(s): LABPROT, INR in the last 72 hours.  Urinalysis: Recent Labs    10/21/20 1520  COLORURINE YELLOW*  LABSPEC 1.014  PHURINE 5.0  GLUCOSEU NEGATIVE  HGBUR NEGATIVE  BILIRUBINUR NEGATIVE  KETONESUR NEGATIVE  PROTEINUR NEGATIVE  NITRITE NEGATIVE  LEUKOCYTESUR NEGATIVE      Imaging: No results found.   Medications:   . sodium chloride    . sodium chloride Stopped (10/21/20 1738)   . aspirin EC  81 mg Oral Daily  . atenolol  25 mg Oral Daily  . Chlorhexidine Gluconate Cloth  6 each Topical Daily  .  dextromethorphan-guaiFENesin  1 tablet Oral BID  . enoxaparin (LOVENOX) injection  30 mg Subcutaneous Q24H  . isosorbide mononitrate  30 mg Oral Daily  . levothyroxine  125 mcg Oral Q0600  . pantoprazole  40 mg Oral Daily  . QUEtiapine  25 mg Oral Daily  . QUEtiapine  50 mg Oral QHS  . simvastatin  10 mg Oral Daily  . sodium chloride flush  3 mL Intravenous Q12H  . tamsulosin  0.4 mg Oral QPC breakfast   sodium chloride, acetaminophen, albuterol, haloperidol lactate, hydrALAZINE, ondansetron (ZOFRAN) IV, sodium chloride flush  Assessment/ Plan:  86 y.o. male  with a PMHx of dementia, COPD, chronic respiratory failure, chronic diastolic heart failure, hypertension, chronic kidney disease stage IIIa, gout, laryngeal cancer, renal artery stenosis who was admitted to [facility] on 10/15/2020 for evaluation of altered mental status.   1.  Acute kidney injury/chronic kidney disease stage IIIa.  Initially it was felt that the patient had some element of intravascular volume depletion.  Patient was started on IV fluid hydration.  However it appears that patient pulled  out IV.   -IV fluids now stopped.  Creatinine down to 2.16.  Good urine output 1.7 L.  Continue to monitor renal parameters over the course of the hospitalization.     LOS: 8 Louis Harrison 12/31/20213:06 PM

## 2020-10-23 NOTE — Progress Notes (Signed)
Order to insert IV- [name] refuses IV insertion due to difficulty in past, with patient's comfort in mind.

## 2020-10-23 NOTE — Progress Notes (Signed)
Patient has been very somnolent, less responsive as day goes on.  RN has to nudge patient to get minimal response of moaning.  Vitals are stable. Worsening secretions throughout day, bilateral hands noted to be mottled and cool at this time.  Dr. Lonny Prude notified of signs of possible transition.  Dr. Lonny Prude assesses patient.

## 2020-10-24 DIAGNOSIS — E039 Hypothyroidism, unspecified: Secondary | ICD-10-CM | POA: Diagnosis not present

## 2020-10-24 DIAGNOSIS — N179 Acute kidney failure, unspecified: Secondary | ICD-10-CM | POA: Diagnosis not present

## 2020-10-24 DIAGNOSIS — I5031 Acute diastolic (congestive) heart failure: Secondary | ICD-10-CM | POA: Diagnosis not present

## 2020-10-24 DIAGNOSIS — G309 Alzheimer's disease, unspecified: Secondary | ICD-10-CM | POA: Diagnosis not present

## 2020-10-24 LAB — BASIC METABOLIC PANEL
Anion gap: 10 (ref 5–15)
BUN: 45 mg/dL — ABNORMAL HIGH (ref 8–23)
CO2: 29 mmol/L (ref 22–32)
Calcium: 8.9 mg/dL (ref 8.9–10.3)
Chloride: 102 mmol/L (ref 98–111)
Creatinine, Ser: 1.99 mg/dL — ABNORMAL HIGH (ref 0.61–1.24)
GFR, Estimated: 31 mL/min — ABNORMAL LOW (ref >60)
Glucose, Bld: 99 mg/dL (ref 70–99)
Potassium: 5.1 mmol/L (ref 3.5–5.1)
Sodium: 141 mmol/L (ref 135–145)

## 2020-10-24 MED ORDER — GUAIFENESIN 100 MG/5ML PO SOLN
10.0000 mL | ORAL | Status: DC
  Administered 2020-10-24 – 2020-10-26 (×4): 200 mg
  Filled 2020-10-24 (×18): qty 10

## 2020-10-24 MED ORDER — DEXTROMETHORPHAN POLISTIREX ER 30 MG/5ML PO SUER
5.0000 mL | Freq: Two times a day (BID) | ORAL | Status: DC
  Administered 2020-10-24 – 2020-10-26 (×4): 30 mg
  Filled 2020-10-24 (×7): qty 5

## 2020-10-24 NOTE — Progress Notes (Signed)
PROGRESS NOTE    Louis Harrison  UXN:[id number] DOB: [birth year] DOA: 10/15/2020 PCP: [facility] [place]   Brief Narrative: Louis Harrison is a 87 y.o. male with a history of COPD, chronic respiratory failure on oxygen, CHF, hypertension. Patient presented secondary to altered mental status and found to have an acute heart failure exacerbation. IV diuresis initiated with initial improvement but development of AKI from overdiuresis.   Assessment & Plan:   Principal Problem:   Dementia ([place]) Active Problems:   Chronic respiratory failure (HCC)   COPD (chronic obstructive pulmonary disease) (HCC)   Hypertension   Coronary artery disease   Acute CHF (congestive heart failure) (HCC)   CKD (chronic kidney disease), stage IV (HCC)   Hypothyroidism   Acute on chronic diastolic heart failure Pulmonary edema on x-ray. Patient started on Lasix IV with good diuresis and transitioned to torsemide 40 mg daily. Lasix diuresis discontinued secondary to worsening renal function. Weight down 5.5 kg from admission. Net IO Since Admission: -6,405.8 mL [10/24/20 1144]. Transthoracic Echocardiogram significant for an EF of 45-50% -Watch fluid status carefully with cessation of diuretic and initiation of IV fluids for AKI  AKI on CKD stage IIIa Nephrology consulted. AKI in setting of IV diuresis. Nephrology consulted. AKI improving. IV access removed by patient. Kidney function continues to improve. Discussed with son and requests to avoid IV placement with no desire for IV fluid resuscitation -Nephrology recommendations: monitor renal function  Acute on chronic respiratory failure On 2L/min of oxygen as an outpatient. Required as much as 4 L/min on admission which was quickly weaned down to home 2 L. -Continue 2 l/min of oxygen  COPD exacerbation Patient managed on systemic steroids and nebulizer treatments in addition to Azithromycin which is now completed. Now  resolved. -Continue albuterol prn  Elevated troponin In setting of heart failure. Peak high sensitivity troponin of 93.  Primary hypertension -Continue Imdur, atenolol  Hypothyroidism -Continue Synthroid 125 mcg daily  Tophaceous gout Patient is on indomethacin as an outpatient. Currently stable.  BPH -Continue Flomax  GERD -Continue Protonix  Dementia Still with significant behavioral issues. Has received haldol intermittently which causes a significant amount of sedation and may need to be avoided -Continue Seroquel; may need to increase dose depending on patient response -address patient's needs as best as possible -Oral intake only; will not resuscitate via IV fluids per family request; likely transition to hospice on discharge.  Acute urinary retention Unknown etiology. Urinalysis is unremarkable for infection. Foley catheter placed on 12/30 -Urine culture   DVT prophylaxis: Lovenox Code Status:   Code Status: DNR Family Communication: None at bedside. Disposition Plan: Discharge likely to hospice pending continued [place] discussion and family decision. Decision to be made by 1/3  Consultants:   Cardiology  Nephrology  Palliative care medicine  Procedures:   TRANSTHORACIC ECHOCARDIOGRAM (10/16/2020) IMPRESSIONS    1. Left ventricular ejection fraction, by estimation, is 45 to 50%. The  left ventricle has mildly decreased function. The left ventricle  demonstrates regional wall motion abnormalities (see scoring  diagram/findings for description). Left ventricular  diastolic parameters were normal.  2. Right ventricular systolic function is normal. The right ventricular  size is normal.  3. Left atrial size was mildly dilated.  4. The mitral valve is normal in structure. Mild mitral valve  regurgitation.  5. The aortic valve is normal in structure. Aortic valve regurgitation is  not visualized.  Antimicrobials:  Azithromycin     Subjective: Says he is okay.  Objective: Vitals:   10/24/20 0546 10/24/20 0625 10/24/20 0959 10/24/20 1001  BP:  (!) 146/98 110/68   Pulse:  (!) 53  68  Resp:  20    Temp:  98.1 F (36.7 C)    TempSrc:  Oral    SpO2:  99%    Weight: 93.9 kg     Height:        Intake/Output Summary (Last 24 hours) at 10/24/2020 1144 Last data filed at 10/24/2020 0739 Gross per 24 hour  Intake 360 ml  Output 1125 ml  Net -765 ml   Filed Weights   10/21/20 0013 10/22/20 0353 10/24/20 0546  Weight: 79.8 kg 80.7 kg 93.9 kg    Examination:  General exam: Appears calm and comfortable Respiratory system: Diminished. Respiratory effort normal. Cardiovascular system: S1 & S2 heard, RRR. No murmurs, rubs, gallops or clicks. Gastrointestinal system: Abdomen is nondistended, soft and nontender. No organomegaly or masses felt. Normal bowel sounds heard. Central nervous system: Sleeping but arouses. Oriented to person and place. No focal neurological deficits. Musculoskeletal: No edema. No calf tenderness Skin: No cyanosis. No rashes Psychiatry: Judgement and insight appear impaired.  Data Reviewed: I have personally reviewed following labs and imaging studies  CBC Lab Results  Component Value Date   WBC 8.4 10/21/2020   RBC 4.31 10/21/2020   HGB 14.6 10/21/2020   HCT 45.0 10/21/2020   MCV 104.4 (H) 10/21/2020   MCH 33.9 10/21/2020   PLT 110 (L) 10/21/2020   MCHC 32.4 10/21/2020   RDW 13.8 10/21/2020   LYMPHSABS 0.8 10/15/2020   MONOABS 0.7 10/15/2020   EOSABS 0.1 10/15/2020   BASOSABS 0.1 92/42/6834     Last metabolic panel Lab Results  Component Value Date   NA 141 10/24/2020   K 5.1 10/24/2020   CL 102 10/24/2020   CO2 29 10/24/2020   BUN 45 (H) 10/24/2020   CREATININE 1.99 (H) 10/24/2020   GLUCOSE 99 10/24/2020   GFRNONAA 31 (L) 10/24/2020   GFRAA 54 (L) 09/17/2016   CALCIUM 8.9 10/24/2020   PROT 6.0 (L) 10/20/2020   ALBUMIN 3.3 (L) 10/20/2020   BILITOT 1.6 (H)  10/20/2020   ALKPHOS 60 10/20/2020   AST 28 10/20/2020   ALT 18 10/20/2020   ANIONGAP 10 10/24/2020    CBG (last 3)  No results for input(s): GLUCAP in the last 72 hours.   GFR: Estimated Creatinine Clearance: 28.9 mL/min (A) (by C-G formula based on SCr of 1.99 mg/dL (H)).  Coagulation Profile: No results for input(s): INR, PROTIME in the last 168 hours.  Recent Results (from the past 240 hour(s))  Resp Panel by RT-PCR (Flu A&B, Covid) Nasopharyngeal Swab     Status: None   Collection Time: 10/15/20 11:38 AM   Specimen: Nasopharyngeal Swab; Nasopharyngeal(NP) swabs in vial transport medium  Result Value Ref Range Status   SARS Coronavirus 2 by RT PCR NEGATIVE NEGATIVE Final    Comment: (NOTE) SARS-CoV-2 target nucleic acids are NOT DETECTED.  The SARS-CoV-2 RNA is generally detectable in upper respiratory specimens during the acute phase of infection. The lowest concentration of SARS-CoV-2 viral copies this assay can detect is 138 copies/mL. A negative result does not preclude SARS-Cov-2 infection and should not be used as the sole basis for treatment or other patient management decisions. A negative result may occur with  improper specimen collection/handling, submission of specimen other than nasopharyngeal swab, presence of viral mutation(s) within the areas targeted by this assay, and inadequate number of viral  copies(<138 copies/mL). A negative result must be combined with clinical observations, patient history, and epidemiological information. The expected result is Negative.  Fact Sheet for Patients:  EntrepreneurPulse.com.au  Fact Sheet for Healthcare Providers:  IncredibleEmployment.be  This test is no t yet approved or cleared by the [place] FDA and  has been authorized for detection and/or diagnosis of SARS-CoV-2 by FDA under an Emergency Use Authorization (EUA). This EUA will remain  in effect (meaning this test can  be used) for the duration of the COVID-19 declaration under Section 564(b)(1) of the Act, 21 U.S.C.section 360bbb-3(b)(1), unless the authorization is terminated  or revoked sooner.       Influenza A by PCR NEGATIVE NEGATIVE Final   Influenza B by PCR NEGATIVE NEGATIVE Final    Comment: (NOTE) The Xpert Xpress SARS-CoV-2/FLU/RSV plus assay is intended as an aid in the diagnosis of influenza from Nasopharyngeal swab specimens and should not be used as a sole basis for treatment. Nasal washings and aspirates are unacceptable for Xpert Xpress SARS-CoV-2/FLU/RSV testing.  Fact Sheet for Patients: EntrepreneurPulse.com.au  Fact Sheet for Healthcare Providers: IncredibleEmployment.be  This test is not yet approved or cleared by the [place] FDA and has been authorized for detection and/or diagnosis of SARS-CoV-2 by FDA under an Emergency Use Authorization (EUA). This EUA will remain in effect (meaning this test can be used) for the duration of the COVID-19 declaration under Section 564(b)(1) of the Act, 21 U.S.C. section 360bbb-3(b)(1), unless the authorization is terminated or revoked.  Performed at [facility], [address] [place], [place] [address]         Radiology Studies: No results found.      Scheduled Meds: . aspirin EC  81 mg Oral Daily  . atenolol  25 mg Oral Daily  . Chlorhexidine Gluconate Cloth  6 each Topical Daily  . dextromethorphan-guaiFENesin  1 tablet Oral BID  . enoxaparin (LOVENOX) injection  30 mg Subcutaneous Q24H  . isosorbide mononitrate  30 mg Oral Daily  . levothyroxine  125 mcg Oral Q0600  . pantoprazole  40 mg Oral Daily  . QUEtiapine  25 mg Oral Daily  . QUEtiapine  50 mg Oral QHS  . simvastatin  10 mg Oral Daily  . sodium chloride flush  3 mL Intravenous Q12H  . tamsulosin  0.4 mg Oral QPC breakfast   Continuous Infusions: . sodium chloride    . sodium chloride Stopped  (10/21/20 1738)     LOS: 9 days     Cordelia Poche, MD [facility] 10/24/2020, 11:44 AM  If 7PM-7AM, please contact night-coverage [email or web address]

## 2020-10-24 NOTE — Progress Notes (Signed)
[facility]  ROUNDING NOTE   Subjective:  Patient with soft mitten restraints on at the moment. Son at bedside. Urine output 875 over the preceding 24 hours. Creatinine down to 1.9.   Objective:  Vital signs in last 24 hours:  Temp:  [97.6 F (36.4 C)-98.6 F (37 C)] 97.8 F (36.6 C) (01/01 1144) Pulse Rate:  [53-80] 64 (01/01 1144) Resp:  [17-20] 20 (01/01 0625) BP: (103-158)/(55-121) 103/55 (01/01 1144) SpO2:  [98 %-100 %] 98 % (01/01 1144) Weight:  [93.9 kg] 93.9 kg (01/01 0546)  Weight change:  Filed Weights   10/21/20 0013 10/22/20 0353 10/24/20 0546  Weight: 79.8 kg 80.7 kg 93.9 kg    Intake/Output: I/O last 3 completed shifts: In: 480 [P.O.:480] Out: 1575 [Urine:1575]   Intake/Output this shift:  Total I/O In: -  Out: 250 [Urine:250]  Physical Exam: General:  No acute distress  Head:  Normocephalic, atraumatic. Moist oral mucosal membranes  Eyes:  Anicteric  Neck:  Supple  Lungs:   Clear to auscultation, normal effort  Heart:  S1S2 no rubs  Abdomen:   Soft, nontender, bowel sounds present  Extremities:  Trace peripheral edema.  Signs of gouty arthropathy in both hands  Neurologic:  Lethargic but arousable  Skin:  No acute rash  Access:  No hemodialysis access    Basic Metabolic Panel: Recent Labs  Lab 10/18/20 0854 10/19/20 0415 10/20/20 0604 10/21/20 0740 10/22/20 0915 10/23/20 0408 10/24/20 0457  NA 140   < > 140 136 139 142 141  K 4.3   < > 4.8 4.2 4.3 4.7 5.1  CL 97*   < > 98 98 99 103 102  CO2 32   < > 30 24 28 30 29   GLUCOSE 93   < > 92 81 105* 91 99  BUN 32*   < > 64* 66* 60* 48* 45*  CREATININE 1.89*   < > 3.20* 3.15* 2.51* 2.16* 1.99*  CALCIUM 8.7*   < > 8.5* 8.0* 8.6* 8.6* 8.9  MG 2.0  --   --   --   --   --   --    < > = values in this interval not displayed.    Liver Function Tests: Recent Labs  Lab 10/20/20 2116  AST 28  ALT 18  ALKPHOS 60  BILITOT 1.6*  PROT 6.0*  ALBUMIN 3.3*   No results for  input(s): LIPASE, AMYLASE in the last 168 hours. Recent Labs  Lab 10/20/20 2116  AMMONIA 24    CBC: Recent Labs  Lab 10/18/20 0854 10/19/20 0415 10/20/20 0604 10/21/20 0503  WBC 10.9* 10.0 11.7* 8.4  HGB 17.4* 18.1* 17.1* 14.6  HCT 53.5* 54.4* 51.5 45.0  MCV 103.1* 102.6* 102.8* 104.4*  PLT 130* 132* 125* 110*    Cardiac Enzymes: No results for input(s): CKTOTAL, CKMB, CKMBINDEX, TROPONINI in the last 168 hours.  BNP: Invalid input(s): POCBNP  CBG: No results for input(s): GLUCAP in the last 168 hours.  Microbiology: Results for orders placed or performed during the hospital encounter of 10/15/20  Resp Panel by RT-PCR (Flu A&B, Covid) Nasopharyngeal Swab     Status: None   Collection Time: 10/15/20 11:38 AM   Specimen: Nasopharyngeal Swab; Nasopharyngeal(NP) swabs in vial transport medium  Result Value Ref Range Status   SARS Coronavirus 2 by RT PCR NEGATIVE NEGATIVE Final    Comment: (NOTE) SARS-CoV-2 target nucleic acids are NOT DETECTED.  The SARS-CoV-2 RNA is generally detectable in upper respiratory  specimens during the acute phase of infection. The lowest concentration of SARS-CoV-2 viral copies this assay can detect is 138 copies/mL. A negative result does not preclude SARS-Cov-2 infection and should not be used as the sole basis for treatment or other patient management decisions. A negative result may occur with  improper specimen collection/handling, submission of specimen other than nasopharyngeal swab, presence of viral mutation(s) within the areas targeted by this assay, and inadequate number of viral copies(<138 copies/mL). A negative result must be combined with clinical observations, patient history, and epidemiological information. The expected result is Negative.  Fact Sheet for Patients:  EntrepreneurPulse.com.au  Fact Sheet for Healthcare Providers:  IncredibleEmployment.be  This test is no t yet  approved or cleared by the [place] FDA and  has been authorized for detection and/or diagnosis of SARS-CoV-2 by FDA under an Emergency Use Authorization (EUA). This EUA will remain  in effect (meaning this test can be used) for the duration of the COVID-19 declaration under Section 564(b)(1) of the Act, 21 U.S.C.section 360bbb-3(b)(1), unless the authorization is terminated  or revoked sooner.       Influenza A by PCR NEGATIVE NEGATIVE Final   Influenza B by PCR NEGATIVE NEGATIVE Final    Comment: (NOTE) The Xpert Xpress SARS-CoV-2/FLU/RSV plus assay is intended as an aid in the diagnosis of influenza from Nasopharyngeal swab specimens and should not be used as a sole basis for treatment. Nasal washings and aspirates are unacceptable for Xpert Xpress SARS-CoV-2/FLU/RSV testing.  Fact Sheet for Patients: EntrepreneurPulse.com.au  Fact Sheet for Healthcare Providers: IncredibleEmployment.be  This test is not yet approved or cleared by the [place] FDA and has been authorized for detection and/or diagnosis of SARS-CoV-2 by FDA under an Emergency Use Authorization (EUA). This EUA will remain in effect (meaning this test can be used) for the duration of the COVID-19 declaration under Section 564(b)(1) of the Act, 21 U.S.C. section 360bbb-3(b)(1), unless the authorization is terminated or revoked.  Performed at [facility], [address] [place], [place] [address]     Coagulation Studies: No results for input(s): LABPROT, INR in the last 72 hours.  Urinalysis: Recent Labs    10/21/20 1520  COLORURINE YELLOW*  LABSPEC 1.014  PHURINE 5.0  GLUCOSEU NEGATIVE  HGBUR NEGATIVE  BILIRUBINUR NEGATIVE  KETONESUR NEGATIVE  PROTEINUR NEGATIVE  NITRITE NEGATIVE  LEUKOCYTESUR NEGATIVE      Imaging: No results found.   Medications:   . sodium chloride    . sodium chloride Stopped (10/21/20 1738)   . aspirin EC   81 mg Oral Daily  . atenolol  25 mg Oral Daily  . Chlorhexidine Gluconate Cloth  6 each Topical Daily  . dextromethorphan-guaiFENesin  1 tablet Oral BID  . enoxaparin (LOVENOX) injection  30 mg Subcutaneous Q24H  . isosorbide mononitrate  30 mg Oral Daily  . levothyroxine  125 mcg Oral Q0600  . pantoprazole  40 mg Oral Daily  . QUEtiapine  25 mg Oral Daily  . QUEtiapine  50 mg Oral QHS  . simvastatin  10 mg Oral Daily  . sodium chloride flush  3 mL Intravenous Q12H  . tamsulosin  0.4 mg Oral QPC breakfast   sodium chloride, acetaminophen, albuterol, hydrALAZINE, LORazepam, ondansetron (ZOFRAN) IV, sodium chloride flush  Assessment/ Plan:  87 y.o. male  with a PMHx of dementia, COPD, chronic respiratory failure, chronic diastolic heart failure, hypertension, chronic kidney disease stage IIIa, gout, laryngeal cancer, renal artery stenosis who was admitted to [facility] on  10/15/2020 for evaluation of altered mental status.   1.  Acute kidney injury/chronic kidney disease stage IIIa.  Initially it was felt that the patient had some element of intravascular volume depletion.  Patient was started on IV fluid hydration.  However it appears that patient pulled out IV.   -Renal function is slowly improving.  Creatinine down to 1.99.  Urine output 975 cc over the preceding 24 hours.  For now continue supportive care.  His p.o. intake remains diminished.  Family potentially considering palliative and comfort care.     LOS: 9 Anyi Fels 1/1/202212:41 PM

## 2020-10-24 NOTE — Progress Notes (Incomplete)
PROGRESS NOTE    Louis Harrison  VOH:[id number] DOB: [birth year] DOA: 10/15/2020 PCP: [facility] [place]   Brief Narrative: Louis Harrison is a 87 y.o. male with a history of COPD, chronic respiratory failure on oxygen, CHF, hypertension. Patient presented secondary to altered mental status and found to have an acute heart failure exacerbation. IV diuresis initiated with initial improvement but development of AKI from overdiuresis.   Assessment & Plan:   Principal Problem:   Dementia ([place]) Active Problems:   Chronic respiratory failure (HCC)   COPD (chronic obstructive pulmonary disease) (HCC)   Hypertension   Coronary artery disease   Acute CHF (congestive heart failure) (HCC)   CKD (chronic kidney disease), stage IV (HCC)   Hypothyroidism   Acute on chronic diastolic heart failure Pulmonary edema on x-ray. Patient started on Lasix IV with good diuresis and transitioned to torsemide 40 mg daily. Lasix diuresis discontinued secondary to worsening renal function. Weight down 5.5 kg from admission. Net IO Since Admission: -6,155.8 mL [10/24/20 0738]. Transthoracic Echocardiogram significant for an EF of 45-50% -Watch fluid status carefully with cessation of diuretic and initiation of IV fluids for AKI  AKI on CKD stage IIIa Nephrology consulted. AKI in setting of IV diuresis. Nephrology consulted. AKI improving. IV access removed by patient. Kidney function continues to improve -Nephrology recommendations  Acute on chronic respiratory failure On 2 L/min of oxygen as an outpatient. Required as much as 4 L/min on admission which was quickly weaned down to home 2 L. -Continue 2 l/min of oxygen  COPD exacerbation Patient managed on systemic steroids and nebulizer treatments in addition to Azithromycin which is now completed. Now resolved. -Continue albuterol prn  Elevated troponin In setting of heart failure. Peak high sensitivity troponin of 93.  Primary  hypertension -Continue Imdur, atenolol  Hypothyroidism -Continue Synthroid 125 mcg daily  Tophaceous gout Patient is on indomethacin as an outpatient. Currently stable.  BPH -Continue Flomax  GERD -Continue Protonix  Dementia Still with significant behavioral issues. Has received haldol intermittently which causes a significant amount of sedation and may need to be avoided -Continue Seroquel; may need to increase dose depending on patient response -Address patient's needs as best as possible -***  Acute urinary retention Unknown etiology. Urinalysis is unremarkable for infection. Foley catheter placed on 12/30. Urine culture ordered 12/31 and marked as sent but does not appear to have actually been done/sent -Re-order Urine culture -Continue foley catheter   DVT prophylaxis: Lovenox Code Status:   Code Status: DNR Family Communication: *** Disposition Plan: Discharge likely to SNF in several days pending improvement of AKI and behavior vs goals of care discussions and possible end of life care   Consultants:   Cardiology  Nephrology  Palliative care medicine  Procedures:   TRANSTHORACIC ECHOCARDIOGRAM (10/16/2020) IMPRESSIONS    1. Left ventricular ejection fraction, by estimation, is 45 to 50%. The  left ventricle has mildly decreased function. The left ventricle  demonstrates regional wall motion abnormalities (see scoring  diagram/findings for description). Left ventricular  diastolic parameters were normal.  2. Right ventricular systolic function is normal. The right ventricular  size is normal.  3. Left atrial size was mildly dilated.  4. The mitral valve is normal in structure. Mild mitral valve  regurgitation.  5. The aortic valve is normal in structure. Aortic valve regurgitation is  not visualized.  Antimicrobials:  Azithromycin    Subjective: ***  Objective: Vitals:   10/23/20 1934 10/23/20 2316 10/24/20 0546 10/24/20  0625  BP: (!)  158/121 119/68  (!) 146/98  Pulse: 80 (!) 57  (!) 53  Resp: 20 17  20   Temp:  98.6 F (37 C)  98.1 F (36.7 C)  TempSrc:  Oral  Oral  SpO2:  100%  99%  Weight:   93.9 kg   Height:        Intake/Output Summary (Last 24 hours) at 10/24/2020 0738 Last data filed at 10/23/2020 2243 Gross per 24 hour  Intake 480 ml  Output 875 ml  Net -395 ml   Filed Weights   10/21/20 0013 10/22/20 0353 10/24/20 0546  Weight: 79.8 kg 80.7 kg 93.9 kg    Examination:  General exam: Appears calm and comfortable *** Respiratory system: Clear to auscultation. Respiratory effort normal. Cardiovascular system: S1 & S2 heard, RRR. No murmurs, rubs, gallops or clicks. Gastrointestinal system: Abdomen is nondistended, soft and nontender. No organomegaly or masses felt. Normal bowel sounds heard. Central nervous system: Alert and oriented. No focal neurological deficits. Musculoskeletal: No edema. No calf tenderness Skin: No cyanosis. No rashes Psychiatry: Judgement and insight appear normal. Mood & affect appropriate.   Data Reviewed: I have personally reviewed following labs and imaging studies  CBC Lab Results  Component Value Date   WBC 8.4 10/21/2020   RBC 4.31 10/21/2020   HGB 14.6 10/21/2020   HCT 45.0 10/21/2020   MCV 104.4 (H) 10/21/2020   MCH 33.9 10/21/2020   PLT 110 (L) 10/21/2020   MCHC 32.4 10/21/2020   RDW 13.8 10/21/2020   LYMPHSABS 0.8 10/15/2020   MONOABS 0.7 10/15/2020   EOSABS 0.1 10/15/2020   BASOSABS 0.1 34/19/3790     Last metabolic panel Lab Results  Component Value Date   NA 141 10/24/2020   K 5.1 10/24/2020   CL 102 10/24/2020   CO2 29 10/24/2020   BUN 45 (H) 10/24/2020   CREATININE 1.99 (H) 10/24/2020   GLUCOSE 99 10/24/2020   GFRNONAA 31 (L) 10/24/2020   GFRAA 54 (L) 09/17/2016   CALCIUM 8.9 10/24/2020   PROT 6.0 (L) 10/20/2020   ALBUMIN 3.3 (L) 10/20/2020   BILITOT 1.6 (H) 10/20/2020   ALKPHOS 60 10/20/2020   AST 28 10/20/2020   ALT 18 10/20/2020    ANIONGAP 10 10/24/2020    CBG (last 3)  No results for input(s): GLUCAP in the last 72 hours.   GFR: Estimated Creatinine Clearance: 28.9 mL/min (A) (by C-G formula based on SCr of 1.99 mg/dL (H)).  Coagulation Profile: No results for input(s): INR, PROTIME in the last 168 hours.  Recent Results (from the past 240 hour(s))  Resp Panel by RT-PCR (Flu A&B, Covid) Nasopharyngeal Swab     Status: None   Collection Time: 10/15/20 11:38 AM   Specimen: Nasopharyngeal Swab; Nasopharyngeal(NP) swabs in vial transport medium  Result Value Ref Range Status   SARS Coronavirus 2 by RT PCR NEGATIVE NEGATIVE Final    Comment: (NOTE) SARS-CoV-2 target nucleic acids are NOT DETECTED.  The SARS-CoV-2 RNA is generally detectable in upper respiratory specimens during the acute phase of infection. The lowest concentration of SARS-CoV-2 viral copies this assay can detect is 138 copies/mL. A negative result does not preclude SARS-Cov-2 infection and should not be used as the sole basis for treatment or other patient management decisions. A negative result may occur with  improper specimen collection/handling, submission of specimen other than nasopharyngeal swab, presence of viral mutation(s) within the areas targeted by this assay, and inadequate number of viral copies(<138 copies/mL).  A negative result must be combined with clinical observations, patient history, and epidemiological information. The expected result is Negative.  Fact Sheet for Patients:  EntrepreneurPulse.com.au  Fact Sheet for Healthcare Providers:  IncredibleEmployment.be  This test is no t yet approved or cleared by the [place] FDA and  has been authorized for detection and/or diagnosis of SARS-CoV-2 by FDA under an Emergency Use Authorization (EUA). This EUA will remain  in effect (meaning this test can be used) for the duration of the COVID-19 declaration under Section 564(b)(1)  of the Act, 21 U.S.C.section 360bbb-3(b)(1), unless the authorization is terminated  or revoked sooner.       Influenza A by PCR NEGATIVE NEGATIVE Final   Influenza B by PCR NEGATIVE NEGATIVE Final    Comment: (NOTE) The Xpert Xpress SARS-CoV-2/FLU/RSV plus assay is intended as an aid in the diagnosis of influenza from Nasopharyngeal swab specimens and should not be used as a sole basis for treatment. Nasal washings and aspirates are unacceptable for Xpert Xpress SARS-CoV-2/FLU/RSV testing.  Fact Sheet for Patients: EntrepreneurPulse.com.au  Fact Sheet for Healthcare Providers: IncredibleEmployment.be  This test is not yet approved or cleared by the [place] FDA and has been authorized for detection and/or diagnosis of SARS-CoV-2 by FDA under an Emergency Use Authorization (EUA). This EUA will remain in effect (meaning this test can be used) for the duration of the COVID-19 declaration under Section 564(b)(1) of the Act, 21 U.S.C. section 360bbb-3(b)(1), unless the authorization is terminated or revoked.  Performed at [facility], [address] [place], [place] [address]         Radiology Studies: No results found.      Scheduled Meds: . aspirin EC  81 mg Oral Daily  . atenolol  25 mg Oral Daily  . Chlorhexidine Gluconate Cloth  6 each Topical Daily  . dextromethorphan-guaiFENesin  1 tablet Oral BID  . enoxaparin (LOVENOX) injection  30 mg Subcutaneous Q24H  . isosorbide mononitrate  30 mg Oral Daily  . levothyroxine  125 mcg Oral Q0600  . pantoprazole  40 mg Oral Daily  . QUEtiapine  25 mg Oral Daily  . QUEtiapine  50 mg Oral QHS  . simvastatin  10 mg Oral Daily  . sodium chloride flush  3 mL Intravenous Q12H  . tamsulosin  0.4 mg Oral QPC breakfast   Continuous Infusions: . sodium chloride    . sodium chloride Stopped (10/21/20 1738)     LOS: 9 days     Cordelia Poche, MD [facility] 10/24/2020, 7:38 AM  If 7PM-7AM, please contact night-coverage [email or web address]

## 2020-10-25 DIAGNOSIS — G309 Alzheimer's disease, unspecified: Secondary | ICD-10-CM | POA: Diagnosis not present

## 2020-10-25 DIAGNOSIS — F0281 Dementia in other diseases classified elsewhere with behavioral disturbance: Secondary | ICD-10-CM | POA: Diagnosis not present

## 2020-10-25 LAB — BASIC METABOLIC PANEL
Anion gap: 12 (ref 5–15)
BUN: 52 mg/dL — ABNORMAL HIGH (ref 8–23)
CO2: 25 mmol/L (ref 22–32)
Calcium: 8.7 mg/dL — ABNORMAL LOW (ref 8.9–10.3)
Chloride: 102 mmol/L (ref 98–111)
Creatinine, Ser: 2.2 mg/dL — ABNORMAL HIGH (ref 0.61–1.24)
GFR, Estimated: 28 mL/min — ABNORMAL LOW (ref >60)
Glucose, Bld: 112 mg/dL — ABNORMAL HIGH (ref 70–99)
Potassium: 4.8 mmol/L (ref 3.5–5.1)
Sodium: 139 mmol/L (ref 135–145)

## 2020-10-25 NOTE — Progress Notes (Signed)
Mobility Specialist - Progress Note   10/25/20 1400  Mobility  Activity Refused mobility  Mobility performed by Mobility specialist    Mobility attempt 2nd time this date. Pt still sleeping on arrival and does not awake to voice/touch. Pt impulsively swinging arms while sleeping. Will attempt session at another date.    Kathee Delton Mobility Specialist 10/25/20, 2:15 PM

## 2020-10-25 NOTE — Progress Notes (Signed)
Mobility Specialist - Progress Note   10/25/20 1100  Mobility  Activity Refused mobility  Mobility performed by Mobility specialist    Pt sleeping soundly upon arrival and would not arouse to voice/touch. Will attempt session at another time.    Kathee Delton Mobility Specialist 10/25/20, 11:24 AM

## 2020-10-25 NOTE — Progress Notes (Signed)
[facility]  ROUNDING NOTE   Subjective:  Creatinine slightly higher today at 2.2.  Diminished p.o. fluid intake noted. Urine output was 950 cc over the preceding 24 hours. Patient son considering hospice care.   Objective:  Vital signs in last 24 hours:  Temp:  [98.7 F (37.1 C)-99.4 F (37.4 C)] 98.7 F (37.1 C) (01/02 1241) Pulse Rate:  [56-69] 56 (01/02 1241) Resp:  [16-20] 16 (01/02 1241) BP: (101-117)/(56-94) 117/60 (01/02 1241) SpO2:  [92 %-97 %] 97 % (01/02 1241)  Weight change:  Filed Weights   10/21/20 0013 10/22/20 0353 10/24/20 0546  Weight: 79.8 kg 80.7 kg 93.9 kg    Intake/Output: I/O last 3 completed shifts: In: 240 [P.O.:240] Out: 1250 [Urine:1250]   Intake/Output this shift:  Total I/O In: 240 [P.O.:240] Out: -   Physical Exam: General:  No acute distress  Head:  Normocephalic, atraumatic. Moist oral mucosal membranes  Eyes:  Anicteric  Neck:  Supple  Lungs:   Clear to auscultation, normal effort  Heart:  S1S2 no rubs  Abdomen:   Soft, nontender, bowel sounds present  Extremities:  Trace peripheral edema.  Signs of gouty arthropathy in both hands  Neurologic:  Lethargic but arousable  Skin:  No acute rash  Access:  No hemodialysis access    Basic Metabolic Panel: Recent Labs  Lab 10/21/20 0740 10/22/20 0915 10/23/20 0408 10/24/20 0457 10/25/20 0521  NA 136 139 142 141 139  K 4.2 4.3 4.7 5.1 4.8  CL 98 99 103 102 102  CO2 24 28 30 29 25   GLUCOSE 81 105* 91 99 112*  BUN 66* 60* 48* 45* 52*  CREATININE 3.15* 2.51* 2.16* 1.99* 2.20*  CALCIUM 8.0* 8.6* 8.6* 8.9 8.7*    Liver Function Tests: Recent Labs  Lab 10/20/20 2116  AST 28  ALT 18  ALKPHOS 60  BILITOT 1.6*  PROT 6.0*  ALBUMIN 3.3*   No results for input(s): LIPASE, AMYLASE in the last 168 hours. Recent Labs  Lab 10/20/20 2116  AMMONIA 24    CBC: Recent Labs  Lab 10/19/20 0415 10/20/20 0604 10/21/20 0503  WBC 10.0 11.7* 8.4  HGB 18.1* 17.1*  14.6  HCT 54.4* 51.5 45.0  MCV 102.6* 102.8* 104.4*  PLT 132* 125* 110*    Cardiac Enzymes: No results for input(s): CKTOTAL, CKMB, CKMBINDEX, TROPONINI in the last 168 hours.  BNP: Invalid input(s): POCBNP  CBG: No results for input(s): GLUCAP in the last 168 hours.  Microbiology: Results for orders placed or performed during the hospital encounter of 10/15/20  Resp Panel by RT-PCR (Flu A&B, Covid) Nasopharyngeal Swab     Status: None   Collection Time: 10/15/20 11:38 AM   Specimen: Nasopharyngeal Swab; Nasopharyngeal(NP) swabs in vial transport medium  Result Value Ref Range Status   SARS Coronavirus 2 by RT PCR NEGATIVE NEGATIVE Final    Comment: (NOTE) SARS-CoV-2 target nucleic acids are NOT DETECTED.  The SARS-CoV-2 RNA is generally detectable in upper respiratory specimens during the acute phase of infection. The lowest concentration of SARS-CoV-2 viral copies this assay can detect is 138 copies/mL. A negative result does not preclude SARS-Cov-2 infection and should not be used as the sole basis for treatment or other patient management decisions. A negative result may occur with  improper specimen collection/handling, submission of specimen other than nasopharyngeal swab, presence of viral mutation(s) within the areas targeted by this assay, and inadequate number of viral copies(<138 copies/mL). A negative result must be combined with clinical  observations, patient history, and epidemiological information. The expected result is Negative.  Fact Sheet for Patients:  EntrepreneurPulse.com.au  Fact Sheet for Healthcare Providers:  IncredibleEmployment.be  This test is no t yet approved or cleared by the [place] FDA and  has been authorized for detection and/or diagnosis of SARS-CoV-2 by FDA under an Emergency Use Authorization (EUA). This EUA will remain  in effect (meaning this test can be used) for the duration of  the COVID-19 declaration under Section 564(b)(1) of the Act, 21 U.S.C.section 360bbb-3(b)(1), unless the authorization is terminated  or revoked sooner.       Influenza A by PCR NEGATIVE NEGATIVE Final   Influenza B by PCR NEGATIVE NEGATIVE Final    Comment: (NOTE) The Xpert Xpress SARS-CoV-2/FLU/RSV plus assay is intended as an aid in the diagnosis of influenza from Nasopharyngeal swab specimens and should not be used as a sole basis for treatment. Nasal washings and aspirates are unacceptable for Xpert Xpress SARS-CoV-2/FLU/RSV testing.  Fact Sheet for Patients: EntrepreneurPulse.com.au  Fact Sheet for Healthcare Providers: IncredibleEmployment.be  This test is not yet approved or cleared by the [place] FDA and has been authorized for detection and/or diagnosis of SARS-CoV-2 by FDA under an Emergency Use Authorization (EUA). This EUA will remain in effect (meaning this test can be used) for the duration of the COVID-19 declaration under Section 564(b)(1) of the Act, 21 U.S.C. section 360bbb-3(b)(1), unless the authorization is terminated or revoked.  Performed at [facility], [address] [place], [place] [address]     Coagulation Studies: No results for input(s): LABPROT, INR in the last 72 hours.  Urinalysis: No results for input(s): COLORURINE, LABSPEC, PHURINE, GLUCOSEU, HGBUR, BILIRUBINUR, KETONESUR, PROTEINUR, UROBILINOGEN, NITRITE, LEUKOCYTESUR in the last 72 hours.  Invalid input(s): APPERANCEUR    Imaging: No results found.   Medications:   . sodium chloride    . sodium chloride Stopped (10/21/20 1738)   . aspirin EC  81 mg Oral Daily  . atenolol  25 mg Oral Daily  . Chlorhexidine Gluconate Cloth  6 each Topical Daily  . dextromethorphan  5 mL Per Tube Q12H   And  . guaiFENesin  10 mL Per Tube Q4H  . enoxaparin (LOVENOX) injection  30 mg Subcutaneous Q24H  . isosorbide mononitrate  30 mg Oral  Daily  . levothyroxine  125 mcg Oral Q0600  . pantoprazole  40 mg Oral Daily  . QUEtiapine  25 mg Oral Daily  . QUEtiapine  50 mg Oral QHS  . simvastatin  10 mg Oral Daily  . sodium chloride flush  3 mL Intravenous Q12H  . tamsulosin  0.4 mg Oral QPC breakfast   sodium chloride, acetaminophen, albuterol, hydrALAZINE, LORazepam, ondansetron (ZOFRAN) IV, sodium chloride flush  Assessment/ Plan:  87 y.o. male  with a PMHx of dementia, COPD, chronic respiratory failure, chronic diastolic heart failure, hypertension, chronic kidney disease stage IIIa, gout, laryngeal cancer, renal artery stenosis who was admitted to [facility] on 10/15/2020 for evaluation of altered mental status.   1.  Acute kidney injury/chronic kidney disease stage IIIa.  Initially it was felt that the patient had some element of intravascular volume depletion.  Patient was started on IV fluid hydration.  However it appears that patient pulled out IV.   -P.o. intake has dropped over the preceding 24 hours.  Creatinine up to 2.2.  Urine output 950 cc over the preceding 24 hours.  Patient previously pulled out IV and not replaced.  Family unsure as to  whether they wanted it replaced as patient has been quite agitated.  Family considering comfort care which is certainly reasonable in this situation.     LOS: 10 Dynasti Kerman 1/2/20222:58 PM

## 2020-10-25 NOTE — Progress Notes (Addendum)
PROGRESS NOTE    Louis Harrison  TGG:[id number] DOB: [birth year] DOA: 10/15/2020 PCP: [facility] [place]   Brief Narrative: Louis Harrison is a 87 y.o. male with a history of COPD, chronic respiratory failure on oxygen, CHF, hypertension. Patient presented secondary to altered mental status and found to have an acute heart failure exacerbation. IV diuresis initiated with initial improvement but development of AKI from overdiuresis.     Assessment & Plan:   Principal Problem:   Dementia ([place]) Active Problems:   Chronic respiratory failure (HCC)   COPD (chronic obstructive pulmonary disease) (HCC)   Hypertension   Coronary artery disease   Acute CHF (congestive heart failure) (HCC)   CKD (chronic kidney disease), stage IV (HCC)   Hypothyroidism   Acute on chronic diastolic heart failure Pulmonary edema on x-ray. Patient started on Lasix IV with good diuresis and transitioned to torsemide 40 mg daily.  Lasix diuresis discontinued secondary to worsening renal function.  Transthoracic Echocardiogram significant for an EF of 45-50% Net -6.8 L since admission Plan: Monitor volume status closely Patient has no IV access at this time so no IV fluids can be given  AKI on CKD stage IIIa Nephrology consulted.  IV access removed by patient.  Creatinine slightly worse Son does not wish for replacement of IV line Plan: Monitor kidney function  Acute on chronic respiratory failure On 2L/min of oxygen as an outpatient.  Required as much as 4 L/min on admission which was quickly weaned down to home 2 L. -Continue 2 l/min of oxygen  COPD exacerbation Patient managed on systemic steroids and nebulizer treatments in addition to Azithromycin which is now completed. Now resolved. -Continue albuterol prn  Elevated troponin In setting of heart failure. Peak high sensitivity troponin of 93. Suspect demand ischemia  Primary hypertension -Continue Imdur,  atenolol  Hypothyroidism -Continue Synthroid 125 mcg daily  Tophaceous gout Patient is on indomethacin as an outpatient. Currently stable.  BPH -Continue Flomax  GERD -Continue Protonix  Dementia Still with significant behavioral issues.  Has received haldol intermittently which causes a significant amount of sedation and may need to be avoided Plan: -Continue Seroquel; may need to increase dose depending on patient response -address patient's needs as best as possible -Oral intake only; will not resuscitate via IV fluids per family request; likely transition to hospice on discharge.  Acute urinary retention Unknown etiology. Urinalysis is unremarkable for infection. Foley catheter placed on 12/30   DVT prophylaxis: Lovenox Code Status: DNR Family Communication: Son Ibrahima Holberg via phone [phone] on 10/25/2020 Disposition Plan: Status is: Inpatient  Remains inpatient appropriate because:Altered mental status and Inpatient level of care appropriate due to severity of illness   Dispo:  Patient From: Home  Planned Disposition: [place]  Expected discharge date: 10/26/2020  Medically stable for discharge: No  Mental status remains somewhat poor.  Per documentation and previous discussions with the family possible transition to hospice care on 10/26/2020   Consultants:   Cardiology  Nephrology  Palliative care medicine  Procedures:   TRANSTHORACIC ECHOCARDIOGRAM (10/16/2020) IMPRESSIONS    1. Left ventricular ejection fraction, by estimation, is 45 to 50%. The  left ventricle has mildly decreased function. The left ventricle  demonstrates regional wall motion abnormalities (see scoring  diagram/findings for description). Left ventricular  diastolic parameters were normal.  2. Right ventricular systolic function is normal. The right ventricular  size is normal.  3. Left atrial size was mildly dilated.  4. The mitral valve  is normal in  structure. Mild mitral valve  regurgitation.  5. The aortic valve is normal in structure. Aortic valve regurgitation is  not visualized.  Antimicrobials:  Azithromycin    Subjective: Says he is okay.   Objective: Vitals:   10/24/20 1144 10/24/20 2045 10/25/20 0526 10/25/20 0756  BP: (!) 103/55 (!) 106/94 (!) 101/56 (!) 104/58  Pulse: 64 69 63 61  Resp:  20 20 20   Temp: 97.8 F (36.6 C) 99.4 F (37.4 C)  98.9 F (37.2 C)  TempSrc:  Oral    SpO2: 98% 95% 92% 93%  Weight:      Height:        Intake/Output Summary (Last 24 hours) at 10/25/2020 1045 Last data filed at 10/25/2020 0500 Gross per 24 hour  Intake 240 ml  Output 700 ml  Net -460 ml   Filed Weights   10/21/20 0013 10/22/20 0353 10/24/20 0546  Weight: 79.8 kg 80.7 kg 93.9 kg    Examination:  General exam: Appears calm and comfortable Respiratory system: Diminished. Respiratory effort normal. Cardiovascular system: S1 & S2 heard, RRR. No murmurs, rubs, gallops or clicks. Gastrointestinal system: Abdomen is nondistended, soft and nontender. No organomegaly or masses felt. Normal bowel sounds heard. Central nervous system: Sleeping but arouses. Oriented to person and place. No focal neurological deficits. Musculoskeletal: No edema. No calf tenderness Skin: No cyanosis. No rashes Psychiatry: Judgement and insight appear impaired.  Data Reviewed: I have personally reviewed following labs and imaging studies  CBC Lab Results  Component Value Date   WBC 8.4 10/21/2020   RBC 4.31 10/21/2020   HGB 14.6 10/21/2020   HCT 45.0 10/21/2020   MCV 104.4 (H) 10/21/2020   MCH 33.9 10/21/2020   PLT 110 (L) 10/21/2020   MCHC 32.4 10/21/2020   RDW 13.8 10/21/2020   LYMPHSABS 0.8 10/15/2020   MONOABS 0.7 10/15/2020   EOSABS 0.1 10/15/2020   BASOSABS 0.1 84/16/6063     Last metabolic panel Lab Results  Component Value Date   NA 139 10/25/2020   K 4.8 10/25/2020   CL 102 10/25/2020   CO2 25 10/25/2020   BUN  52 (H) 10/25/2020   CREATININE 2.20 (H) 10/25/2020   GLUCOSE 112 (H) 10/25/2020   GFRNONAA 28 (L) 10/25/2020   GFRAA 54 (L) 09/17/2016   CALCIUM 8.7 (L) 10/25/2020   PROT 6.0 (L) 10/20/2020   ALBUMIN 3.3 (L) 10/20/2020   BILITOT 1.6 (H) 10/20/2020   ALKPHOS 60 10/20/2020   AST 28 10/20/2020   ALT 18 10/20/2020   ANIONGAP 12 10/25/2020    CBG (last 3)  No results for input(s): GLUCAP in the last 72 hours.   GFR: Estimated Creatinine Clearance: 26.1 mL/min (A) (by C-G formula based on SCr of 2.2 mg/dL (H)).  Coagulation Profile: No results for input(s): INR, PROTIME in the last 168 hours.  Recent Results (from the past 240 hour(s))  Resp Panel by RT-PCR (Flu A&B, Covid) Nasopharyngeal Swab     Status: None   Collection Time: 10/15/20 11:38 AM   Specimen: Nasopharyngeal Swab; Nasopharyngeal(NP) swabs in vial transport medium  Result Value Ref Range Status   SARS Coronavirus 2 by RT PCR NEGATIVE NEGATIVE Final    Comment: (NOTE) SARS-CoV-2 target nucleic acids are NOT DETECTED.  The SARS-CoV-2 RNA is generally detectable in upper respiratory specimens during the acute phase of infection. The lowest concentration of SARS-CoV-2 viral copies this assay can detect is 138 copies/mL. A negative result does not preclude SARS-Cov-2 infection  and should not be used as the sole basis for treatment or other patient management decisions. A negative result may occur with  improper specimen collection/handling, submission of specimen other than nasopharyngeal swab, presence of viral mutation(s) within the areas targeted by this assay, and inadequate number of viral copies(<138 copies/mL). A negative result must be combined with clinical observations, patient history, and epidemiological information. The expected result is Negative.  Fact Sheet for Patients:  EntrepreneurPulse.com.au  Fact Sheet for Healthcare Providers:   IncredibleEmployment.be  This test is no t yet approved or cleared by the [place] FDA and  has been authorized for detection and/or diagnosis of SARS-CoV-2 by FDA under an Emergency Use Authorization (EUA). This EUA will remain  in effect (meaning this test can be used) for the duration of the COVID-19 declaration under Section 564(b)(1) of the Act, 21 U.S.C.section 360bbb-3(b)(1), unless the authorization is terminated  or revoked sooner.       Influenza A by PCR NEGATIVE NEGATIVE Final   Influenza B by PCR NEGATIVE NEGATIVE Final    Comment: (NOTE) The Xpert Xpress SARS-CoV-2/FLU/RSV plus assay is intended as an aid in the diagnosis of influenza from Nasopharyngeal swab specimens and should not be used as a sole basis for treatment. Nasal washings and aspirates are unacceptable for Xpert Xpress SARS-CoV-2/FLU/RSV testing.  Fact Sheet for Patients: EntrepreneurPulse.com.au  Fact Sheet for Healthcare Providers: IncredibleEmployment.be  This test is not yet approved or cleared by the [place] FDA and has been authorized for detection and/or diagnosis of SARS-CoV-2 by FDA under an Emergency Use Authorization (EUA). This EUA will remain in effect (meaning this test can be used) for the duration of the COVID-19 declaration under Section 564(b)(1) of the Act, 21 U.S.C. section 360bbb-3(b)(1), unless the authorization is terminated or revoked.  Performed at [facility], [address] [place], [place] [address]         Radiology Studies: No results found.      Scheduled Meds: . aspirin EC  81 mg Oral Daily  . atenolol  25 mg Oral Daily  . Chlorhexidine Gluconate Cloth  6 each Topical Daily  . dextromethorphan  5 mL Per Tube Q12H   And  . guaiFENesin  10 mL Per Tube Q4H  . enoxaparin (LOVENOX) injection  30 mg Subcutaneous Q24H  . isosorbide mononitrate  30 mg Oral Daily  . levothyroxine   125 mcg Oral Q0600  . pantoprazole  40 mg Oral Daily  . QUEtiapine  25 mg Oral Daily  . QUEtiapine  50 mg Oral QHS  . simvastatin  10 mg Oral Daily  . sodium chloride flush  3 mL Intravenous Q12H  . tamsulosin  0.4 mg Oral QPC breakfast   Continuous Infusions: . sodium chloride    . sodium chloride Stopped (10/21/20 1738)     LOS: 10 days     [employer] [facility] 10/25/2020, 10:45 AM  If 7PM-7AM, please contact night-coverage [email or web address]

## 2020-10-26 DIAGNOSIS — F0281 Dementia in other diseases classified elsewhere with behavioral disturbance: Secondary | ICD-10-CM | POA: Diagnosis not present

## 2020-10-26 DIAGNOSIS — G309 Alzheimer's disease, unspecified: Secondary | ICD-10-CM | POA: Diagnosis not present

## 2020-10-26 LAB — BASIC METABOLIC PANEL WITH GFR
Anion gap: 12 (ref 5–15)
BUN: 62 mg/dL — ABNORMAL HIGH (ref 8–23)
CO2: 26 mmol/L (ref 22–32)
Calcium: 8.5 mg/dL — ABNORMAL LOW (ref 8.9–10.3)
Chloride: 104 mmol/L (ref 98–111)
Creatinine, Ser: 2.33 mg/dL — ABNORMAL HIGH (ref 0.61–1.24)
GFR, Estimated: 26 mL/min — ABNORMAL LOW
Glucose, Bld: 105 mg/dL — ABNORMAL HIGH (ref 70–99)
Potassium: 4.5 mmol/L (ref 3.5–5.1)
Sodium: 142 mmol/L (ref 135–145)

## 2020-10-26 MED ORDER — HALOPERIDOL 0.5 MG PO TABS
0.5000 mg | ORAL_TABLET | ORAL | Status: DC | PRN
  Filled 2020-10-26: qty 1

## 2020-10-26 MED ORDER — POLYVINYL ALCOHOL 1.4 % OP SOLN
1.0000 [drp] | Freq: Four times a day (QID) | OPHTHALMIC | Status: DC | PRN
  Filled 2020-10-26: qty 15

## 2020-10-26 MED ORDER — HYDROMORPHONE HCL 1 MG/ML IJ SOLN: 0.5000 mg | INTRAMUSCULAR | Status: DC | PRN

## 2020-10-26 MED ORDER — BIOTENE DRY MOUTH MT LIQD: 15.0000 mL | OROMUCOSAL | Status: DC | PRN

## 2020-10-26 MED ORDER — SODIUM CHLORIDE 0.9 % IV SOLN: 250.0000 mL | INTRAVENOUS | Status: DC | PRN

## 2020-10-26 MED ORDER — LORAZEPAM 2 MG/ML PO CONC
1.0000 mg | ORAL | Status: DC | PRN
  Filled 2020-10-26: qty 0.5

## 2020-10-26 MED ORDER — GLYCOPYRROLATE 1 MG PO TABS
1.0000 mg | ORAL_TABLET | ORAL | Status: DC | PRN
  Filled 2020-10-26: qty 1

## 2020-10-26 MED ORDER — GLYCOPYRROLATE 0.2 MG/ML IJ SOLN
0.2000 mg | INTRAMUSCULAR | Status: DC | PRN
  Filled 2020-10-26: qty 1

## 2020-10-26 MED ORDER — HALOPERIDOL LACTATE 5 MG/ML IJ SOLN: 0.5000 mg | INTRAMUSCULAR | Status: DC | PRN

## 2020-10-26 MED ORDER — HALOPERIDOL LACTATE 2 MG/ML PO CONC
0.5000 mg | ORAL | Status: DC | PRN
  Filled 2020-10-26: qty 0.3

## 2020-10-26 MED ORDER — SODIUM CHLORIDE 0.9% FLUSH: 3.0000 mL | INTRAVENOUS | Status: DC | PRN

## 2020-10-26 MED ORDER — SODIUM CHLORIDE 0.9% FLUSH: 3.0000 mL | Freq: Two times a day (BID) | INTRAVENOUS | Status: DC

## 2020-10-26 MED ORDER — HYDROMORPHONE HCL 1 MG/ML PO LIQD: 1.0000 mg | ORAL | Status: DC | PRN

## 2020-10-26 NOTE — Progress Notes (Signed)
Physical Therapy Discharge Patient Details Name: Louis Harrison MRN: [id number] DOB: [birth year] Today's Date: 10/26/2020 Time:  -     Patient discharged from PT services secondary to transition to Hospice care.  Please see latest therapy progress note for current level of functioning and progress toward goals.    GP     Chesley Noon 10/26/2020, 11:54 AM

## 2020-10-26 NOTE — TOC Progression Note (Signed)
Transition of Care [facility]) - Progression Note    Patient Details  Name: Louis Harrison MRN: [id number] Date of Birth: 12/05/1927  Transition of Care [facility]) CM/SW [place], LCSW Phone Number: 10/26/2020, 11:45 AM  Clinical Narrative:  Per palliative NP, plan for transition to comfort care and referral to hospice facility. CSW called patient's son Nada Boozer. He confirmed preference for [facility] hospice facility. Made referral to [facility] with [facility].   Expected Discharge Plan: [place] Barriers to Discharge: Continued Medical Work up  Expected Discharge Plan and Services Expected Discharge Plan: [place] In-house Referral: Clinical Social Work Discharge Planning Services: CM Consult Post Acute Care Choice: Hospice Living arrangements for the past 2 months: Single Family Home                                       Social Determinants of Health (SDOH) Interventions    Readmission Risk Interventions No flowsheet data found.

## 2020-10-26 NOTE — Progress Notes (Signed)
Daily Progress Note   Patient Name: Louis Harrison       Date: 10/26/2020 DOB: [birth year]  Age: 87 y.o. MRN#: [id number] Attending Physician: Sidney Ace, MD Primary Care Physician: [facility] [place] Date: 10/15/2020  Reason for Consultation/Follow-up: Establishing goals of care  Subjective: Patient is resting in bed. He does not awaken to touch or voice. Even and unlabored respirations. Spoke with son who is HPOA, and a Designer, industrial/product. We discussed his status this morning. He discusses that his father has had a good QOL until a few weeks ago. He states he drives and enjoys mowing and being on his tractor. He states he would not be okay with living in a nursing facility. He states he and his brother have talked and they would like to proceed with comfort care.  I completed a MOST form today through [place] with son Nada Boozer who is HPOA, and the signed original was placed in the chart. A photocopy was placed in the chart to be scanned into EMR. The patient outlined their wishes for the following treatment decisions:  Cardiopulmonary Resuscitation: Do Not Attempt Resuscitation (DNR/No CPR)  Medical Interventions: Comfort Measures: Keep clean, warm, and dry. Use medication by any route, positioning, wound care, and other measures to relieve pain and suffering. Use oxygen, suction and manual treatment of airway obstruction as needed for comfort. Do not transfer to the hospital unless comfort needs cannot be met in current location.  Antibiotics: No antibiotics (use other measures to relieve symptoms)  IV Fluids: No IV fluids (provide other measures to ensure comfort)  Feeding Tube: No feeding tube      Length of Stay: 11  Current Medications: Scheduled Meds:  . atenolol   25 mg Oral Daily  . Chlorhexidine Gluconate Cloth  6 each Topical Daily  . dextromethorphan  5 mL Per Tube Q12H   And  . guaiFENesin  10 mL Per Tube Q4H  . isosorbide mononitrate  30 mg Oral Daily  . QUEtiapine  25 mg Oral Daily  . QUEtiapine  50 mg Oral QHS  . sodium chloride flush  3 mL Intravenous Q12H  . sodium chloride flush  3 mL Intravenous Q12H  . tamsulosin  0.4 mg Oral QPC breakfast    Continuous Infusions: . sodium chloride    .  sodium chloride Stopped (10/21/20 1738)  . sodium chloride      PRN Meds: sodium chloride, sodium chloride, acetaminophen, albuterol, antiseptic oral rinse, glycopyrrolate **OR** glycopyrrolate **OR** glycopyrrolate, haloperidol **OR** haloperidol **OR** haloperidol lactate, HYDROmorphone (DILAUDID) injection, LORazepam, ondansetron (ZOFRAN) IV, polyvinyl alcohol, sodium chloride flush, sodium chloride flush  Physical Exam Constitutional:      Comments: Resting with eyes closed.   Skin:    General: Skin is warm and dry.             Vital Signs: BP 104/63 (BP Location: Left Arm)   Pulse 62   Temp 98.6 F (37 C) (Oral)   Resp 20   Ht 5' 11"  (1.803 m)   Wt 93 kg   SpO2 93%   BMI 28.59 kg/m  SpO2: SpO2: 93 % O2 Device: O2 Device: Nasal Cannula O2 Flow Rate: O2 Flow Rate (L/min): 2 L/min  Intake/output summary:   Intake/Output Summary (Last 24 hours) at 10/26/2020 1105 Last data filed at 10/26/2020 0500 Gross per 24 hour  Intake 270 ml  Output 500 ml  Net -230 ml   LBM: Last BM Date: 10/26/20 Baseline Weight: Weight: 86.2 kg Most recent weight: Weight: 93 kg       Palliative Assessment/Data:    Flowsheet Rows   Flowsheet Row Most Recent Value  Intake Tab   Referral Department Hospitalist  Unit at Time of Referral Med/Surg Unit  Palliative Care Primary Diagnosis Neurology  Date Notified 10/18/20  Palliative Care Type New Palliative care  Reason for referral Clarify Goals of Care, Psychosocial or Spiritual support  Date  of Admission 10/15/20  Date first seen by Palliative Care 10/19/20  # of days Palliative referral response time 1 Day(s)  # of days IP prior to Palliative referral 3  Clinical Assessment   Psychosocial & Spiritual Assessment   Palliative Care Outcomes       Patient Active Problem List   Diagnosis Date Noted  . Acute CHF (congestive heart failure) ([place]) 10/15/2020  . Dementia ([place]) 10/15/2020  . CKD (chronic kidney disease), stage IV ([place]) 10/15/2020  . Hypothyroidism 10/15/2020  . Coronary artery disease 04/05/2017  . Acute respiratory failure with hypoxia ([place]) 09/16/2016  . Dysuria 01/06/2016  . Hematuria 01/06/2016  . Hypertension 01/06/2016  . COPD (chronic obstructive pulmonary disease) ([place]) 03/24/2014  . H/O cardiac catheterization 03/24/2014  . Hyperlipidemia 03/24/2014  . MI (myocardial infarction) ([place]) 03/24/2014  . Renal artery stenosis ([place]) 03/24/2014  . Chronic hypoxemic respiratory failure ([place]) 08/06/2013  . Chronic respiratory failure ([place]) 08/06/2013  . Chronic respiratory failure with hypoxia ([place]) 08/06/2013    Palliative Care Assessment & Plan    Recommendations/Plan:  Shifting to comfort care. Hospice facility recommended.   Code Status:    Code Status Orders  (From admission, onward)         Start     Ordered   10/26/20 1104  Do not attempt resuscitation (DNR)  Continuous       Question Answer Comment  In the event of cardiac or respiratory ARREST Do not call a "code blue"   In the event of cardiac or respiratory ARREST Do not perform Intubation, CPR, defibrillation or ACLS   In the event of cardiac or respiratory ARREST Use medication by any route, position, wound care, and other measures to relive pain and suffering. May use oxygen, suction and manual treatment of airway obstruction as needed for comfort.   Comments MOST form in [place]  10/26/20 1104        Code Status History    Date Active Date Inactive Code Status Order ID  Comments User Context   10/15/2020 1545 10/26/2020 1104 DNR [id number]  Collier Bullock, MD ED   09/16/2016 1602 09/19/2016 1413 Full Code [id number]  Demetrios Loll, MD Inpatient   Advance Care Planning Activity       Prognosis:   < 2 weeks  Care plan was discussed with primary MD and RN, and SW.   Thank you for allowing the Palliative Medicine Team to assist in the care of this patient.   Total Time 35 min Prolonged Time Billed no      Greater than 50%  of this time was spent counseling and coordinating care related to the above assessment and plan.  Asencion Gowda, NP  Please contact Palliative Medicine Team phone at [phone] for questions and concerns.

## 2020-10-26 NOTE — Progress Notes (Signed)
PROGRESS NOTE    Draedyn Weidinger Dezeeuw  TIR:[id number] DOB: 1927-11-27 DOA: 10/15/2020 PCP: [facility]   Brief Narrative: Cadell Gabrielson Friesenhahn is a 87 y.o. male with a history of COPD, chronic respiratory failure on oxygen, CHF, hypertension. Patient presented secondary to altered mental status and found to have an acute heart failure exacerbation. IV diuresis initiated with initial improvement but development of AKI from overdiuresis.  1/3: Patient resting quietly in bed.  Does not awaken to touch or voice.  Appears in no visible distress.  Palliative care communicated with patient's son and medical power of attorney.  Electing on transition towards comfort measures and plan for discharge to inpatient hospice care facility.     Assessment & Plan:   Principal Problem:   Dementia ([place]) Active Problems:   Chronic respiratory failure (HCC)   COPD (chronic obstructive pulmonary disease) (HCC)   Hypertension   Coronary artery disease   Acute CHF (congestive heart failure) (HCC)   CKD (chronic kidney disease), stage IV (HCC)   Hypothyroidism  Dementia Functional decline Adult failure to thrive Still with significant behavioral issues.  Has received haldol intermittently which causes a significant amount of sedation and may need to be avoided 1/3: Transition to comfort measures and plan for discharge to inpatient hospice facility after discussion between patient son and palliative care Plan: DC all medications not focused on patient comfort.  Acute on chronic diastolic heart failure Pulmonary edema on x-ray. Patient started on Lasix IV with good diuresis and transitioned to torsemide 40 mg daily.  Lasix diuresis discontinued secondary to worsening renal function.  Transthoracic Echocardiogram significant for an EF of 45-50% Net -7.1 L since admission Plan: Transition to comfort measures  AKI on CKD stage IIIa Nephrology consulted.  IV access removed by  patient.  Creatinine slightly worse Son does not wish for replacement of IV line Plan: Transition to comfort measures.  Daily lab stopped  Acute on chronic respiratory failure On 2L/min of oxygen as an outpatient.  Required as much as 4 L/min on admission which was quickly weaned down to home 2 L. -Continue 2 L of oxygen for patient comfort only  COPD exacerbation Patient managed on systemic steroids and nebulizer treatments in addition to Azithromycin which is now completed. Now resolved. -Continue albuterol prn  Elevated troponin In setting of heart failure. Peak high sensitivity troponin of 93. Suspect demand ischemia  Primary hypertension -Transition to comfort measures.  Antihypertensive stopped  Hypothyroidism -Transition to comfort measures.  Synthroid stopped  Tophaceous gout   BPH -Continue Flomax  GERD -Continue Protonix    Acute urinary retention Unknown etiology. Urinalysis is unremarkable for infection. Foley catheter placed on 12/30   DVT prophylaxis: Lovenox Code Status: DNR Family Communication: Son Liban Guedes via phone [phone] on 10/25/2020 Disposition Plan: Status is: Inpatient  Remains inpatient appropriate because:Unsafe d/c plan   Dispo:  Patient From: Home  Planned Disposition: Hospice facility  Expected discharge date: 10/27/2020  Medically stable for discharge: Yes  Plan to transition to comfort measures and discharge to inpatient hospice facility.  Referral made to hospice liaison.  Patient is stable for discharge to hospice facility once accepted and bed is found.   Consultants:   Cardiology  Nephrology  Palliative care medicine  Procedures:   TRANSTHORACIC ECHOCARDIOGRAM (10/16/2020) IMPRESSIONS    1. Left ventricular ejection fraction, by estimation, is 45 to 50%. The  left ventricle has mildly decreased function. The left ventricle  demonstrates regional wall motion abnormalities (  see scoring   diagram/findings for description). Left ventricular  diastolic parameters were normal.  2. Right ventricular systolic function is normal. The right ventricular  size is normal.  3. Left atrial size was mildly dilated.  4. The mitral valve is normal in structure. Mild mitral valve  regurgitation.  5. The aortic valve is normal in structure. Aortic valve regurgitation is  not visualized.  Antimicrobials:  Azithromycin    Subjective: Patient seen and examined.  Verbally unresponsive.  In no visible distress  Objective: Vitals:   10/26/20 0435 10/26/20 0452 10/26/20 0753 10/26/20 1114  BP:  (!) 99/52 104/63 (!) 87/52  Pulse:  62 62 (!) 58  Resp:  20 20 20   Temp:  (!) 97.5 F (36.4 C) 98.6 F (37 C) 98.4 F (36.9 C)  TempSrc:  Oral Oral Oral  SpO2:  100% 93% 92%  Weight: 93 kg     Height:        Intake/Output Summary (Last 24 hours) at 10/26/2020 1239 Last data filed at 10/26/2020 0500 Gross per 24 hour  Intake 270 ml  Output 500 ml  Net -230 ml   Filed Weights   10/22/20 0353 10/24/20 0546 10/26/20 0435  Weight: 80.7 kg 93.9 kg 93 kg    Examination:  Physical exam limited due to comfort measure status  General exam: No acute distress.  Does not awaken to voice or touch Respiratory system: Diminished. Respiratory effort normal. Cardiovascular system: S1-S2 heard, regular rate and rhythm, no murmurs gastrointestinal system: Abdomen is nondistended, soft and nontender. No organomegaly or masses felt. Normal bowel sounds heard. Central nervous system: Sleeping.  Verbally unresponsive to voice and touch Musculoskeletal: No edema. No calf tenderness Skin: No cyanosis. No rashes Psychiatry: Judgement and insight appear impaired.  Data Reviewed: I have personally reviewed following labs and imaging studies  CBC Lab Results  Component Value Date   WBC 8.4 10/21/2020   RBC 4.31 10/21/2020   HGB 14.6 10/21/2020   HCT 45.0 10/21/2020   MCV 104.4 (H) 10/21/2020    MCH 33.9 10/21/2020   PLT 110 (L) 10/21/2020   MCHC 32.4 10/21/2020   RDW 13.8 10/21/2020   LYMPHSABS 0.8 10/15/2020   MONOABS 0.7 10/15/2020   EOSABS 0.1 10/15/2020   BASOSABS 0.1 84/69/6295     Last metabolic panel Lab Results  Component Value Date   NA 142 10/26/2020   K 4.5 10/26/2020   CL 104 10/26/2020   CO2 26 10/26/2020   BUN 62 (H) 10/26/2020   CREATININE 2.33 (H) 10/26/2020   GLUCOSE 105 (H) 10/26/2020   GFRNONAA 26 (L) 10/26/2020   GFRAA 54 (L) 09/17/2016   CALCIUM 8.5 (L) 10/26/2020   PROT 6.0 (L) 10/20/2020   ALBUMIN 3.3 (L) 10/20/2020   BILITOT 1.6 (H) 10/20/2020   ALKPHOS 60 10/20/2020   AST 28 10/20/2020   ALT 18 10/20/2020   ANIONGAP 12 10/26/2020    CBG (last 3)  No results for input(s): GLUCAP in the last 72 hours.   GFR: Estimated Creatinine Clearance: 24.6 mL/min (A) (by C-G formula based on SCr of 2.33 mg/dL (H)).  Coagulation Profile: No results for input(s): INR, PROTIME in the last 168 hours.  No results found for this or any previous visit (from the past 240 hour(s)).      Radiology Studies: No results found.      Scheduled Meds: . atenolol  25 mg Oral Daily  . Chlorhexidine Gluconate Cloth  6 each Topical Daily  . dextromethorphan  5 mL Per Tube Q12H   And  . guaiFENesin  10 mL Per Tube Q4H  . isosorbide mononitrate  30 mg Oral Daily  . QUEtiapine  25 mg Oral Daily  . QUEtiapine  50 mg Oral QHS  . sodium chloride flush  3 mL Intravenous Q12H  . sodium chloride flush  3 mL Intravenous Q12H  . tamsulosin  0.4 mg Oral QPC breakfast   Continuous Infusions: . sodium chloride    . sodium chloride Stopped (10/21/20 1738)  . sodium chloride       LOS: 11 days   Time spent: 25 minutes  Cyanne Delmar [facility] 10/26/2020, 12:39 PM  If 7PM-7AM, please contact night-coverage [email or web address]

## 2020-10-26 NOTE — Progress Notes (Signed)
[facility]) Hospital Liaison RN note:  Received request from Asencion Gowda, NP for family interest in [facility]. Chart has been reviewed and approved for eligibility. Spoke with son, [name]", to confirm interests, explain services and initiate education related to hospice philosophy. He will complete paperwork once a bed is available.  Hospice Home does not have a bed to offer today. Son and hospital care team are aware. [facility] Liaison will follow for room availability.   Please call with any hospice related questions or concerns.  Thank you for the opportunity to participate in this patient's care.  Zandra Abts, RN [facility] Liaison  [phone]

## 2020-10-26 NOTE — Progress Notes (Signed)
OT Cancellation Note  Patient Details Name: Louis Harrison MRN: [id number] DOB: 04-20-1928   Cancelled Treatment:    Reason Eval/Treat Not Completed: Other (comment) Pt now placed on comfort care measures. OT to complete orders and SIGN OFF.   Darleen Crocker, MS, OTR/L , CBIS ascom [phone]  10/26/20, 11:10 AM   10/26/2020, 11:10 AM

## 2020-10-26 NOTE — Progress Notes (Signed)
[facility]  ROUNDING NOTE   Subjective:   Not able to answer questions this morning.   Objective:  Vital signs in last 24 hours:  Temp:  [97.5 F (36.4 C)-99.6 F (37.6 C)] 98.4 F (36.9 C) (01/03 1114) Pulse Rate:  [58-70] 58 (01/03 1114) Resp:  [18-20] 20 (01/03 1114) BP: (87-124)/(52-97) 87/52 (01/03 1114) SpO2:  [86 %-100 %] 92 % (01/03 1114) Weight:  [93 kg] 93 kg (01/03 0435)  Weight change:  Filed Weights   10/22/20 0353 10/24/20 0546 10/26/20 0435  Weight: 80.7 kg 93.9 kg 93 kg    Intake/Output: I/O last 3 completed shifts: In: 270 [P.O.:270] Out: 1200 [Urine:1200]   Intake/Output this shift:  No intake/output data recorded.  Physical Exam: General:  No acute distress  Head:  Normocephalic, atraumatic. Moist oral mucosal membranes  Eyes:  Anicteric  Neck:  Supple  Lungs:   Clear to auscultation, normal effort  Heart:  S1S2 no rubs  Abdomen:   Soft, nontender, bowel sounds present  Extremities:  Trace peripheral edema.  Signs of gouty arthropathy in both hands  Neurologic:  Lethargic but arousable  Skin:  No acute rash  Access:  No hemodialysis access    Basic Metabolic Panel: Recent Labs  Lab 10/22/20 0915 10/23/20 0408 10/24/20 0457 10/25/20 0521 10/26/20 0519  NA 139 142 141 139 142  K 4.3 4.7 5.1 4.8 4.5  CL 99 103 102 102 104  CO2 28 30 29 25 26   GLUCOSE 105* 91 99 112* 105*  BUN 60* 48* 45* 52* 62*  CREATININE 2.51* 2.16* 1.99* 2.20* 2.33*  CALCIUM 8.6* 8.6* 8.9 8.7* 8.5*    Liver Function Tests: Recent Labs  Lab 10/20/20 2116  AST 28  ALT 18  ALKPHOS 60  BILITOT 1.6*  PROT 6.0*  ALBUMIN 3.3*   No results for input(s): LIPASE, AMYLASE in the last 168 hours. Recent Labs  Lab 10/20/20 2116  AMMONIA 24    CBC: Recent Labs  Lab 10/20/20 0604 10/21/20 0503  WBC 11.7* 8.4  HGB 17.1* 14.6  HCT 51.5 45.0  MCV 102.8* 104.4*  PLT 125* 110*    Cardiac Enzymes: No results for input(s): CKTOTAL, CKMB,  CKMBINDEX, TROPONINI in the last 168 hours.  BNP: Invalid input(s): POCBNP  CBG: No results for input(s): GLUCAP in the last 168 hours.  Microbiology: Results for orders placed or performed during the hospital encounter of 10/15/20  Resp Panel by RT-PCR (Flu A&B, Covid) Nasopharyngeal Swab     Status: None   Collection Time: 10/15/20 11:38 AM   Specimen: Nasopharyngeal Swab; Nasopharyngeal(NP) swabs in vial transport medium  Result Value Ref Range Status   SARS Coronavirus 2 by RT PCR NEGATIVE NEGATIVE Final    Comment: (NOTE) SARS-CoV-2 target nucleic acids are NOT DETECTED.  The SARS-CoV-2 RNA is generally detectable in upper respiratory specimens during the acute phase of infection. The lowest concentration of SARS-CoV-2 viral copies this assay can detect is 138 copies/mL. A negative result does not preclude SARS-Cov-2 infection and should not be used as the sole basis for treatment or other patient management decisions. A negative result may occur with  improper specimen collection/handling, submission of specimen other than nasopharyngeal swab, presence of viral mutation(s) within the areas targeted by this assay, and inadequate number of viral copies(<138 copies/mL). A negative result must be combined with clinical observations, patient history, and epidemiological information. The expected result is Negative.  Fact Sheet for Patients:  EntrepreneurPulse.com.au  Fact Sheet for  Healthcare Providers:  IncredibleEmployment.be  This test is no t yet approved or cleared by the [place] and  has been authorized for detection and/or diagnosis of SARS-CoV-2 by FDA under an Emergency Use Authorization (EUA). This EUA will remain  in effect (meaning this test can be used) for the duration of the COVID-19 declaration under Section 564(b)(1) of the Act, 21 U.S.C.section 360bbb-3(b)(1), unless the authorization is terminated  or revoked  sooner.       Influenza A by PCR NEGATIVE NEGATIVE Final   Influenza B by PCR NEGATIVE NEGATIVE Final    Comment: (NOTE) The Xpert Xpress SARS-CoV-2/FLU/RSV plus assay is intended as an aid in the diagnosis of influenza from Nasopharyngeal swab specimens and should not be used as a sole basis for treatment. Nasal washings and aspirates are unacceptable for Xpert Xpress SARS-CoV-2/FLU/RSV testing.  Fact Sheet for Patients: EntrepreneurPulse.com.au  Fact Sheet for Healthcare Providers: IncredibleEmployment.be  This test is not yet approved or cleared by the [place] FDA and has been authorized for detection and/or diagnosis of SARS-CoV-2 by FDA under an Emergency Use Authorization (EUA). This EUA will remain in effect (meaning this test can be used) for the duration of the COVID-19 declaration under Section 564(b)(1) of the Act, 21 U.S.C. section 360bbb-3(b)(1), unless the authorization is terminated or revoked.  Performed at [facility], [address] [place], [place] [address]     Coagulation Studies: No results for input(s): LABPROT, INR in the last 72 hours.  Urinalysis: No results for input(s): COLORURINE, LABSPEC, PHURINE, GLUCOSEU, HGBUR, BILIRUBINUR, KETONESUR, PROTEINUR, UROBILINOGEN, NITRITE, LEUKOCYTESUR in the last 72 hours.  Invalid input(s): APPERANCEUR    Imaging: No results found.   Medications:    . atenolol  25 mg Oral Daily  . Chlorhexidine Gluconate Cloth  6 each Topical Daily  . dextromethorphan  5 mL Per Tube Q12H   And  . guaiFENesin  10 mL Per Tube Q4H  . isosorbide mononitrate  30 mg Oral Daily  . QUEtiapine  25 mg Oral Daily  . QUEtiapine  50 mg Oral QHS  . tamsulosin  0.4 mg Oral QPC breakfast   acetaminophen, albuterol, antiseptic oral rinse, glycopyrrolate **OR** glycopyrrolate **OR** glycopyrrolate, haloperidol **OR** haloperidol **OR** haloperidol lactate, HYDROmorphone HCl,  LORazepam, ondansetron (ZOFRAN) IV, polyvinyl alcohol  Assessment/ Plan:  87 y.o. male  with a PMHx of dementia, COPD, chronic respiratory failure, chronic diastolic heart failure, hypertension, chronic kidney disease stage IIIa, gout, laryngeal cancer, renal artery stenosis who was admitted to [facility] on 10/15/2020 for evaluation of altered mental status and acute kidney injury.   1. Acute kidney injury 2. Chronic kidney disease stage IIIA: baseline creatinine 1.4, GFR of 48 on 12/05/17 3. Renal artery stenosis 4. Hypertension  Plan Transition to comfort care. Will sign off.     LOS: [place] 1/3/20222:04 PM

## 2020-10-26 NOTE — Care Management Important Message (Signed)
Important Message  Patient Details  Name: Louis Harrison MRN: [id number] Date of Birth: 01-30-1928   Medicare Important Message Given:  Yes     Dannette Barbara 10/26/2020, 11:13 AM

## 2020-10-27 DIAGNOSIS — G309 Alzheimer's disease, unspecified: Secondary | ICD-10-CM | POA: Diagnosis not present

## 2020-10-27 DIAGNOSIS — F0281 Dementia in other diseases classified elsewhere with behavioral disturbance: Secondary | ICD-10-CM | POA: Diagnosis not present

## 2020-10-27 MED ORDER — GLYCOPYRROLATE 1 MG PO TABS: 1.0000 mg | ORAL_TABLET | ORAL | Status: AC | PRN

## 2020-10-27 MED ORDER — QUETIAPINE FUMARATE 50 MG PO TABS
50.0000 mg | ORAL_TABLET | Freq: Every day | ORAL | Status: AC
Start: 2020-10-27 — End: ?

## 2020-10-27 MED ORDER — QUETIAPINE FUMARATE 25 MG PO TABS: 25.0000 mg | ORAL_TABLET | Freq: Every day | ORAL | Status: AC

## 2020-10-27 MED ORDER — HALOPERIDOL LACTATE 2 MG/ML PO CONC: 0.5000 mg | ORAL | 0 refills | Status: AC | PRN

## 2020-10-27 MED ORDER — LORAZEPAM 2 MG/ML PO CONC: 1.0000 mg | ORAL | 0 refills | Status: AC | PRN

## 2020-10-27 MED ORDER — GLYCOPYRROLATE 0.2 MG/ML IJ SOLN: 0.2000 mg | INTRAMUSCULAR | Status: AC | PRN

## 2020-10-27 MED ORDER — HALOPERIDOL 0.5 MG PO TABS: 0.5000 mg | ORAL_TABLET | ORAL | Status: AC | PRN

## 2020-10-27 MED ORDER — ATENOLOL 25 MG PO TABS: 25.0000 mg | ORAL_TABLET | Freq: Every day | ORAL | Status: AC

## 2020-10-27 MED ORDER — HYDROMORPHONE HCL 1 MG/ML PO LIQD: 1.0000 mg | ORAL | 0 refills | Status: AC | PRN

## 2020-10-27 MED ORDER — ISOSORBIDE MONONITRATE ER 30 MG PO TB24: 30.0000 mg | ORAL_TABLET | Freq: Every day | ORAL | Status: AC

## 2020-10-27 MED ORDER — HALOPERIDOL LACTATE 5 MG/ML IJ SOLN: 0.5000 mg | INTRAMUSCULAR | Status: AC | PRN

## 2020-10-27 NOTE — TOC Transition Note (Signed)
Transition of Care [facility]) - CM/SW Discharge Note   Patient Details  Name: Louis Harrison MRN: [id number] Date of Birth: [birth year]  Transition of Care [facility]) CM/SW Contact:  Candie Chroman, LCSW Phone Number: 10/27/2020, 10:39 AM   Clinical Narrative:  Patient has orders to discharge to [facility] today. Hospice liaison will call report. [facility] set up for 12:00. Hospice liaison will notify son. No further concerns. CSW signing off.   Final next level of care: [place] Barriers to Discharge: Barriers Resolved   Patient Goals and CMS Choice Patient states their goals for this hospitalization and ongoing recovery are:: Gain strength back and possbily go to SNF for rehab   Choice offered to / list presented to : Adult Children  Discharge Placement              Patient chooses bed at: Other - please specify in the comment section below: [facility]) Patient to be transferred to facility by: [facility]   Patient and family notified of of transfer: 10/27/20  Discharge Plan and Services In-house Referral: Clinical Social Work Discharge Planning Services: CM Consult Post Acute Care Choice: Hospice                               Social Determinants of Health (SDOH) Interventions     Readmission Risk Interventions No flowsheet data found.

## 2020-10-27 NOTE — Progress Notes (Signed)
[facility] [facility]) Hospital Liaison RN note:  [facility] does have a room to offer today. Registration paperwork has been completed by son, Jaymond Waage". Transport has been arranged for 12 noon . I have faxed the D/C Summary to the Hospice Home. Son and hospital care team are aware.  Thank you for the opportunity to participate in this patient's care.  Zandra Abts, RN [facility] Liaison [phone]

## 2020-10-27 NOTE — Discharge Summary (Signed)
Physician Discharge Summary  Louis Harrison EXH:[id number] DOB: 07/15/1928 DOA: 10/15/2020  PCP: [facility] [place] date: 10/15/2020 Discharge date: 10/27/2020  Admitted From: Home Disposition: Hospice facility   Discharge Condition:Hospice CODE STATUS:DNR Diet recommendation: Per hospice staff  Brief/Interim Summary: Louis Harrison is a 87 y.o. male with a history of COPD, chronic respiratory failure on oxygen, CHF, hypertension. Patient presented secondary to altered mental status and found to have an acute heart failure exacerbation. IV diuresis initiated with initial improvement but development of AKI from overdiuresis.  1/3: Patient resting quietly in bed.  Does not awaken to touch or voice.  Appears in no visible distress.  Palliative care communicated with patient's son and medical power of attorney.  Electing on transition towards comfort measures and plan for discharge to inpatient hospice care facility.  1/4: Discharged to hospice facility  Discharge Diagnoses:  Principal Problem:   Dementia ([place]) Active Problems:   Chronic respiratory failure (HCC)   COPD (chronic obstructive pulmonary disease) (HCC)   Hypertension   Coronary artery disease   Acute CHF (congestive heart failure) (HCC)   CKD (chronic kidney disease), stage IV ([place])   Hypothyroidism  Dementia Functional decline Adult failure to thrive Still with significant behavioral issues.  Has received haldol intermittently which causes a significant amount of sedation and may need to be avoided 1/3: Transition to comfort measures and plan for discharge to inpatient hospice facility after discussion between patient son and palliative care Plan: DC all medications not focused on patient comfort.  Acute on chronic diastolic heart failure Pulmonary edema on x-ray. Patient started on Lasix IV with good diuresis and transitioned to torsemide 40 mg daily.  Lasix diuresis discontinued  secondary to worsening renal function.  Transthoracic Echocardiogram significant for an EF of 45-50% Net -7.1 L since admission Plan: Transition to comfort measures  AKI on CKD stage IIIa Nephrology consulted.  IV access removed by patient.  Creatinine slightly worse Son does not wish for replacement of IV line Plan: Transition to comfort measures.  Daily lab stopped  Acute on chronic respiratory failure On 2L/min of oxygen as an outpatient.  Required as much as 4 L/min on admission which was quickly weaned down to home 2 L. -Continue 2 L of oxygen for patient comfort only  COPD exacerbation Patient managed on systemic steroids and nebulizer treatments in addition to Azithromycin which is now completed. Now resolved. -Continue albuterol prn  Discharge Instructions  Discharge Instructions    Diet - low sodium heart healthy   Complete by: As directed    Increase activity slowly   Complete by: As directed    No wound care   Complete by: As directed      Allergies as of 10/27/2020   No Known Allergies     Medication List    STOP taking these medications   aspirin EC 81 MG tablet   indomethacin 75 MG CR capsule Commonly known as: INDOCIN SR   levothyroxine 125 MCG tablet Commonly known as: SYNTHROID   lisinopril 5 MG tablet Commonly known as: ZESTRIL   omeprazole 20 MG capsule Commonly known as: PRILOSEC   simvastatin 20 MG tablet Commonly known as: ZOCOR   tamsulosin 0.4 MG Caps capsule Commonly known as: FLOMAX     TAKE these medications   atenolol 25 MG tablet Commonly known as: TENORMIN Take 1 tablet (25 mg total) by mouth daily. What changed:   medication strength  Another medication with the same name  was removed. Continue taking this medication, and follow the directions you see here.   glycopyrrolate 1 MG tablet Commonly known as: ROBINUL Take 1 tablet (1 mg total) by mouth every 4 (four) hours as needed (excessive secretions).    glycopyrrolate 0.2 MG/ML injection Commonly known as: ROBINUL Inject 1 mL (0.2 mg total) into the skin every 4 (four) hours as needed (excessive secretions).   haloperidol 0.5 MG tablet Commonly known as: HALDOL Take 1 tablet (0.5 mg total) by mouth every 4 (four) hours as needed for agitation (or delirium).   haloperidol 2 MG/ML solution Commonly known as: HALDOL Place 0.3 mLs (0.6 mg total) under the tongue every 4 (four) hours as needed for agitation (or delirium).   haloperidol lactate 5 MG/ML injection Commonly known as: HALDOL Inject 0.1 mLs (0.5 mg total) into the vein every 4 (four) hours as needed (or delirium).   HYDROmorphone HCl 1 MG/ML Liqd Commonly known as: DILAUDID Take 1 mL (1 mg total) by mouth every hour as needed for severe pain or moderate pain (or dyspnea).   isosorbide mononitrate 30 MG 24 hr tablet Commonly known as: IMDUR Take 1 tablet (30 mg total) by mouth daily. What changed: Another medication with the same name was removed. Continue taking this medication, and follow the directions you see here.   LORazepam 2 MG/ML concentrated solution Commonly known as: ATIVAN Take 0.5 mLs (1 mg total) by mouth every 4 (four) hours as needed for anxiety (agitation).   QUEtiapine 25 MG tablet Commonly known as: SEROQUEL Take 1 tablet (25 mg total) by mouth daily.   QUEtiapine 50 MG tablet Commonly known as: SEROQUEL Take 1 tablet (50 mg total) by mouth at bedtime.       Follow-up Information    Paraschos, Alexander, MD Follow up in 1 week(s).   Specialty: Cardiology Contact information: [facility] [place] [address] [phone]              No Known Allergies  Consultations:  Palliative care   Procedures/Studies: CT HEAD WO CONTRAST  Result Date: 10/20/2020 CLINICAL DATA:  Mental status change. EXAM: CT HEAD WITHOUT CONTRAST TECHNIQUE: Contiguous axial images were obtained from the base of  the skull through the vertex without intravenous contrast. COMPARISON:  CT head 10/15/2020 FINDINGS: Brain: Cerebral ventricle sizes are concordant with the degree of cerebral volume loss. Patchy and confluent areas of decreased attenuation are noted throughout the deep and periventricular white matter of the cerebral hemispheres bilaterally, compatible with chronic microvascular ischemic disease. No evidence of large-territorial acute infarction. No parenchymal hemorrhage. No mass lesion. No extra-axial collection. No mass effect or midline shift. No hydrocephalus. Basilar cisterns are patent. Vascular: No hyperdense vessel. Skull: No acute fracture or focal lesion. Sinuses/Orbits: Paranasal sinuses and mastoid air cells are clear. The orbits are unremarkable. Other: None. IMPRESSION: No acute intracranial abnormality. Electronically Signed   By: Iven Finn M.D.   On: 10/20/2020 19:13   CT Head Wo Contrast  Result Date: 10/15/2020 CLINICAL DATA:  Mental status change.  History of dementia. EXAM: CT HEAD WITHOUT CONTRAST TECHNIQUE: Contiguous axial images were obtained from the base of the skull through the vertex without intravenous contrast. COMPARISON:  CT head 07/31/2014. FINDINGS: Brain: There is no evidence of acute intracranial hemorrhage, mass lesion, brain edema or extra-axial fluid collection. Mildly progressive atrophy with prominence of the ventricles and subarachnoid spaces. There is patchy low-density in the periventricular white matter bilaterally, likely due to chronic  small vessel ischemic changes. There is no CT evidence of acute cortical infarction. Vascular:  No hyperdense vessel identified. Skull: Negative for fracture or focal lesion. Sinuses/Orbits: The visualized paranasal sinuses and mastoid air cells are clear. No orbital abnormalities are seen. Other: None. IMPRESSION: 1. No acute intracranial findings. 2. Mildly progressive atrophy and chronic small vessel ischemic changes.  Electronically Signed   By: Richardean Sale M.D.   On: 10/15/2020 11:46   US RENAL  Result Date: 10/19/2020 CLINICAL DATA:  Acute kidney injury EXAM: RENAL / URINARY TRACT ULTRASOUND COMPLETE COMPARISON:  None. FINDINGS: Right Kidney: Renal measurements: 10.3 x 4.8 x 4.7 cm = volume: 121 mL. Echogenic renal parenchyma. 3.7 x 4.0 x 3.3 cm interpolar right renal cyst with a single incomplete septation, benign. No hydronephrosis. Left Kidney: Was not imaged.  Patient aborted the study. Bladder: Appears normal for degree of bladder distention. Other: None. IMPRESSION: Left kidney not imaged due to patient request. 4.0 cm minimally complex right renal cyst, benign. No hydronephrosis. Electronically Signed   By: Julian Hy M.D.   On: 10/19/2020 11:32   DG Chest Port 1 View  Result Date: 10/19/2020 CLINICAL DATA:  Altered mental status EXAM: PORTABLE CHEST 1 VIEW COMPARISON:  10/15/2020 FINDINGS: Mild bilateral lower lobe opacities, atelectasis versus pneumonia. No pleural effusion or pneumothorax. The heart is top-normal in size.  Thoracic aortic atherosclerosis. IMPRESSION: Mild bilateral lower lobe opacities, atelectasis versus pneumonia. Electronically Signed   By: Julian Hy M.D.   On: 10/19/2020 08:26   DG Chest Port 1 View  Result Date: 10/15/2020 CLINICAL DATA:  Syncopal episodes.  Multiple falls.  Dementia. EXAM: PORTABLE CHEST 1 VIEW COMPARISON:  Radiographs 03/12/2019 and 11/14/2017.  CT 12/20/2017. FINDINGS: 1120 hours. There are significantly lower lung volumes. The heart size and mediastinal contours are stable with aortic atherosclerosis. There are new patchy airspace opacities at both lung bases associated with poor definition of the pulmonary vasculature. No large pleural effusion, confluent airspace opacity or pneumothorax identified. Telemetry leads overlie the chest. No acute osseous findings. IMPRESSION: Lower lung volumes with new patchy bibasilar airspace opacities which  could reflect viral infection or edema. Followup PA and lateral views recommended when the patient is able. Electronically Signed   By: Richardean Sale M.D.   On: 10/15/2020 11:44   DG Knee Right Port  Result Date: 10/21/2020 CLINICAL DATA:  Generalized RIGHT knee pain. EXAM: PORTABLE RIGHT KNEE - 1-2 VIEW COMPARISON:  Plain film of the RIGHT knee dated 03/01/2016. FINDINGS: Osseous alignment is normal. Bone mineralization is grossly normal. No fracture line or displaced fracture fragment. No acute or suspicious osseous lesion. Mild tricompartmental joint space narrowings. No large osteophytes or other signs of advanced degenerative joint disease Chondrocalcinosis of better appreciated within the joint space on the previous exam. Faint calcific densities are again seen along the course of the quadriceps insertion site, again likely related to the supposed underlying CPPD arthropathy. Probable small joint effusion which is likely chronic. Vascular calcifications of the distal SFA. IMPRESSION: 1. No acute findings. 2. Mild tricompartmental degenerative change. 3. Chondrocalcinosis of the knee joint space, compatible with underlying CPPD arthropathy. 4. Probable small joint effusion which is likely chronic. 5. Vascular calcifications. Electronically Signed   By: Franki Cabot M.D.   On: 10/21/2020 08:16   DG Humerus Left  Result Date: 10/15/2020 CLINICAL DATA:  Frequent falls.  Left arm bruising.  Dementia. EXAM: LEFT HUMERUS - 2+ VIEW COMPARISON:  None. FINDINGS: The mineralization and alignment are  normal. There is no evidence of acute fracture or dislocation. Minor degenerative changes at the shoulder and elbow for age. No foreign body or focal soft tissue swelling identified. IMPRESSION: No acute findings. Minor degenerative changes. Electronically Signed   By: Richardean Sale M.D.   On: 10/15/2020 11:41   ECHOCARDIOGRAM COMPLETE  Result Date: 10/16/2020    ECHOCARDIOGRAM REPORT   Patient Name:    Louis Harrison Date of Exam: 10/16/2020 Medical Rec #:  [id number]          Height:       71.0 in Accession #:    [id number]         Weight:       190.0 lb Date of Birth:  05-19-1928           BSA:          2.063 m Patient Age:    62 years           BP:           158/86 mmHg Patient Gender: M                  HR:           57 bpm. Exam Location:  [facility] Procedure: 2D Echo, Color Doppler and Cardiac Doppler Indications:     I50.31 CHF-Acute Diastolic  History:         Patient has no prior history of Echocardiogram examinations.                  Previous Myocardial Infarction, COPD; Risk Factors:Hypertension                  and Dyslipidemia.  Sonographer:     Charmayne Sheer RDCS (AE) Referring Phys:  [id number] Collier Bullock Diagnosing Phys: Serafina Royals MD  Sonographer Comments: No subcostal window. Image acquisition challenging due to COPD and Image acquisition challenging due to respiratory motion. IMPRESSIONS  1. Left ventricular ejection fraction, by estimation, is 45 to 50%. The left ventricle has mildly decreased function. The left ventricle demonstrates regional wall motion abnormalities (see scoring diagram/findings for description). Left ventricular diastolic parameters were normal.  2. Right ventricular systolic function is normal. The right ventricular size is normal.  3. Left atrial size was mildly dilated.  4. The mitral valve is normal in structure. Mild mitral valve regurgitation.  5. The aortic valve is normal in structure. Aortic valve regurgitation is not visualized. FINDINGS  Left Ventricle: Left ventricular ejection fraction, by estimation, is 45 to 50%. The left ventricle has mildly decreased function. The left ventricle demonstrates regional wall motion abnormalities. Mild hypokinesis of the left ventricular, apical apical segment. The left ventricular internal cavity size was normal in size. There is no left ventricular hypertrophy. Left ventricular diastolic parameters were normal. Right  Ventricle: The right ventricular size is normal. No increase in right ventricular wall thickness. Right ventricular systolic function is normal. Left Atrium: Left atrial size was mildly dilated. Right Atrium: Right atrial size was normal in size. Pericardium: There is no evidence of pericardial effusion. Mitral Valve: The mitral valve is normal in structure. Mild mitral valve regurgitation. MV peak gradient, 1.2 mmHg. The mean mitral valve gradient is 0.0 mmHg. Tricuspid Valve: The tricuspid valve is normal in structure. Tricuspid valve regurgitation is mild. Aortic Valve: The aortic valve is normal in structure. Aortic valve regurgitation is not visualized. Aortic valve mean gradient measures 3.0 mmHg. Aortic valve peak gradient measures 4.2 mmHg. Aortic  valve area, by VTI measures 2.45 cm. Pulmonic Valve: The pulmonic valve was normal in structure. Pulmonic valve regurgitation is trivial. Aorta: The aortic root and ascending aorta are structurally normal, with no evidence of dilitation. IAS/Shunts: No atrial level shunt detected by color flow Doppler.  LEFT VENTRICLE PLAX 2D LVIDd:         4.96 cm  Diastology LVIDs:         3.26 cm  LV e' lateral:   6.09 cm/s LV PW:         0.97 cm  LV E/e' lateral: 7.8 LV IVS:        0.89 cm LVOT diam:     2.10 cm LV SV:         49 LV SV Index:   24 LVOT Area:     3.46 cm  RIGHT VENTRICLE RV Basal diam:  3.48 cm LEFT ATRIUM             Index       RIGHT ATRIUM           Index LA diam:        4.20 cm 2.04 cm/m  RA Area:     14.70 cm LA Vol (A2C):   66.4 ml 32.18 ml/m RA Volume:   32.80 ml  15.90 ml/m LA Vol (A4C):   43.8 ml 21.23 ml/m LA Biplane Vol: 54.7 ml 26.51 ml/m  AORTIC VALVE                   PULMONIC VALVE AV Area (Vmax):    2.41 cm    PV Vmax:       0.89 m/s AV Area (Vmean):   2.04 cm    PV Vmean:      59.200 cm/s AV Area (VTI):     2.45 cm    PV VTI:        0.163 m AV Vmax:           103.00 cm/s PV Peak grad:  3.2 mmHg AV Vmean:          76.900 cm/s PV Mean  grad:  2.0 mmHg AV VTI:            0.201 m AV Peak Grad:      4.2 mmHg AV Mean Grad:      3.0 mmHg LVOT Vmax:         71.80 cm/s LVOT Vmean:        45.200 cm/s LVOT VTI:          0.142 m LVOT/AV VTI ratio: 0.71  AORTA Ao Root diam: 3.50 cm MITRAL VALVE MV Area (PHT): 2.59 cm    SHUNTS MV Peak grad:  1.2 mmHg    Systemic VTI:  0.14 m MV Mean grad:  0.0 mmHg    Systemic Diam: 2.10 cm MV Vmax:       0.55 m/s MV Vmean:      33.6 cm/s MV Decel Time: 293 msec MV E velocity: 47.60 cm/s MV A velocity: 39.80 cm/s MV E/A ratio:  1.20 Serafina Royals MD Electronically signed by Serafina Royals MD Signature Date/Time: 10/16/2020/2:47:53 PM    Final     (Echo, Carotid, EGD, Colonoscopy, ERCP)    Subjective: Seen and examined on the day of discharge.  Unresponsive to voice or touch.    Discharge Exam: Vitals:   10/26/20 1533 10/27/20 0506  BP: (!) 101/52 (!) 106/57  Pulse: (!) 47 (!) 56  Resp: (!) 22  20  Temp: 97.6 F (36.4 C) 97.9 F (36.6 C)  SpO2: 95% 94%   Vitals:   10/26/20 0753 10/26/20 1114 10/26/20 1533 10/27/20 0506  BP: 104/63 (!) 87/52 (!) 101/52 (!) 106/57  Pulse: 62 (!) 58 (!) 47 (!) 56  Resp: 20 20 (!) 22 20  Temp: 98.6 F (37 C) 98.4 F (36.9 C) 97.6 F (36.4 C) 97.9 F (36.6 C)  TempSrc: Oral Oral Oral Axillary  SpO2: 93% 92% 95% 94%  Weight:      Height:        General: NAD, lethargic Cardiovascular: RRR, S1/S2 +, no rubs, no gallops Respiratory: Mild bl wheeze, normal WOB, 2L Abdominal: Soft, NT, ND, bowel sounds + Extremities: no edema, no cyanosis    The results of significant diagnostics from this hospitalization (including imaging, microbiology, ancillary and laboratory) are listed below for reference.     Microbiology: No results found for this or any previous visit (from the past 240 hour(s)).   Labs: BNP (last 3 results) Recent Labs    10/15/20 1115  BNP 0,932.6*   Basic Metabolic Panel: Recent Labs  Lab 10/22/20 0915 10/23/20 0408 10/24/20 0457  10/25/20 0521 10/26/20 0519  NA 139 142 141 139 142  K 4.3 4.7 5.1 4.8 4.5  CL 99 103 102 102 104  CO2 28 30 29 25 26   GLUCOSE 105* 91 99 112* 105*  BUN 60* 48* 45* 52* 62*  CREATININE 2.51* 2.16* 1.99* 2.20* 2.33*  CALCIUM 8.6* 8.6* 8.9 8.7* 8.5*   Liver Function Tests: Recent Labs  Lab 10/20/20 2116  AST 28  ALT 18  ALKPHOS 60  BILITOT 1.6*  PROT 6.0*  ALBUMIN 3.3*   No results for input(s): LIPASE, AMYLASE in the last 168 hours. Recent Labs  Lab 10/20/20 2116  AMMONIA 24   CBC: Recent Labs  Lab 10/21/20 0503  WBC 8.4  HGB 14.6  HCT 45.0  MCV 104.4*  PLT 110*   Cardiac Enzymes: No results for input(s): CKTOTAL, CKMB, CKMBINDEX, TROPONINI in the last 168 hours. BNP: Invalid input(s): POCBNP CBG: No results for input(s): GLUCAP in the last 168 hours. D-Dimer No results for input(s): DDIMER in the last 72 hours. Hgb A1c No results for input(s): HGBA1C in the last 72 hours. Lipid Profile No results for input(s): CHOL, HDL, LDLCALC, TRIG, CHOLHDL, LDLDIRECT in the last 72 hours. Thyroid function studies No results for input(s): TSH, T4TOTAL, T3FREE, THYROIDAB in the last 72 hours.  Invalid input(s): FREET3 Anemia work up No results for input(s): VITAMINB12, FOLATE, FERRITIN, TIBC, IRON, RETICCTPCT in the last 72 hours. Urinalysis    Component Value Date/Time   COLORURINE YELLOW (A) 10/21/2020 1520   APPEARANCEUR CLEAR (A) 10/21/2020 1520   LABSPEC 1.014 10/21/2020 1520   PHURINE 5.0 10/21/2020 1520   GLUCOSEU NEGATIVE 10/21/2020 1520   HGBUR NEGATIVE 10/21/2020 [place] 10/21/2020 [place] 10/21/2020 1520   PROTEINUR NEGATIVE 10/21/2020 1520   NITRITE NEGATIVE 10/21/2020 1520   LEUKOCYTESUR NEGATIVE 10/21/2020 1520   Sepsis Labs Invalid input(s): PROCALCITONIN,  WBC,  LACTICIDVEN Microbiology No results found for this or any previous visit (from the past 240 hour(s)).   Time coordinating discharge: Over 30  minutes  SIGNED:   Sidney Ace, MD  [facility] 10/27/2020, 9:35 AM Pager   If 7PM-7AM, please contact night-coverage

## 2020-10-27 NOTE — Progress Notes (Signed)
Louis Harrison to be D/C'd [facility]ospice Home per MD order. Discussed discharge instructions with Helene Kelp RN with Hospice Home.  Allergies as of 10/27/2020   No Known Allergies     Medication List    STOP taking these medications   aspirin EC 81 MG tablet   indomethacin 75 MG CR capsule Commonly known as: INDOCIN SR   levothyroxine 125 MCG tablet Commonly known as: SYNTHROID   lisinopril 5 MG tablet Commonly known as: ZESTRIL   omeprazole 20 MG capsule Commonly known as: PRILOSEC   simvastatin 20 MG tablet Commonly known as: ZOCOR   tamsulosin 0.4 MG Caps capsule Commonly known as: FLOMAX     TAKE these medications   atenolol 25 MG tablet Commonly known as: TENORMIN Take 1 tablet (25 mg total) by mouth daily. What changed:   medication strength  Another medication with the same name was removed. Continue taking this medication, and follow the directions you see here.   glycopyrrolate 1 MG tablet Commonly known as: ROBINUL Take 1 tablet (1 mg total) by mouth every 4 (four) hours as needed (excessive secretions).   glycopyrrolate 0.2 MG/ML injection Commonly known as: ROBINUL Inject 1 mL (0.2 mg total) into the skin every 4 (four) hours as needed (excessive secretions).   haloperidol 0.5 MG tablet Commonly known as: HALDOL Take 1 tablet (0.5 mg total) by mouth every 4 (four) hours as needed for agitation (or delirium).   haloperidol 2 MG/ML solution Commonly known as: HALDOL Place 0.3 mLs (0.6 mg total) under the tongue every 4 (four) hours as needed for agitation (or delirium).   haloperidol lactate 5 MG/ML injection Commonly known as: HALDOL Inject 0.1 mLs (0.5 mg total) into the vein every 4 (four) hours as needed (or delirium).   HYDROmorphone HCl 1 MG/ML Liqd Commonly known as: DILAUDID Take 1 mL (1 mg total) by mouth every hour as needed for severe pain or moderate pain (or dyspnea).   isosorbide mononitrate 30 MG 24 hr tablet Commonly known as:  IMDUR Take 1 tablet (30 mg total) by mouth daily. What changed: Another medication with the same name was removed. Continue taking this medication, and follow the directions you see here.   LORazepam 2 MG/ML concentrated solution Commonly known as: ATIVAN Take 0.5 mLs (1 mg total) by mouth every 4 (four) hours as needed for anxiety (agitation).   QUEtiapine 25 MG tablet Commonly known as: SEROQUEL Take 1 tablet (25 mg total) by mouth daily.   QUEtiapine 50 MG tablet Commonly known as: SEROQUEL Take 1 tablet (50 mg total) by mouth at bedtime.       Vitals:   10/26/20 1533 10/27/20 0506  BP: (!) 101/52 (!) 106/57  Pulse: (!) 47 (!) 56  Resp: (!) 22 20  Temp: 97.6 F (36.4 C) 97.9 F (36.6 C)  SpO2: 95% 94%    Skin clean, dry and intact without evidence of skin break down, no evidence of skin tears noted. IV catheter discontinued intact. Site without signs and symptoms of complications. Dressing and pressure applied.   An After Visit Summary was printed and given to the EMT. Patient escorted via stretcher and D/C Hospice Home via EMS.   Fuller Mandril, RN

## 2020-10-27 NOTE — TOC Progression Note (Signed)
Transition of Care [facility]) - Progression Note    Patient Details  Name: Louis Harrison MRN: [id number] Date of Birth: 05/10/1928  Transition of Care [facility]) CM/SW [place], LCSW Phone Number: 10/27/2020, 9:02 AM  Clinical Narrative: Per Zandra Abts, RN with [facility], they will have a bed for patient today. MD is aware. [facility] staff will contact son regarding signing consents and Kieth Brightly will notify CSW when transport can be arranged.    Expected Discharge Plan: [place] Barriers to Discharge: Continued Medical Work up  Expected Discharge Plan and Services Expected Discharge Plan: [place] In-house Referral: Clinical Social Work Discharge Planning Services: CM Consult Post Acute Care Choice: Hospice Living arrangements for the past 2 months: Single Family Home                                       Social Determinants of Health (SDOH) Interventions    Readmission Risk Interventions No flowsheet data found.

## 2020-11-24 DEATH — deceased
